# Patient Record
Sex: Female | Born: 1935 | Race: White | Hispanic: No | Smoking: Former smoker
Health system: Southern US, Community
[De-identification: ages and names within clinical notes are randomized; demographics above are authoritative.]

## PROBLEM LIST (undated history)

## (undated) ENCOUNTER — Emergency Department: Admission: EM | Discharge status: Absent without leave | Payer: Medicare Other

## (undated) DIAGNOSIS — F0150 Vascular dementia without behavioral disturbance: Secondary | ICD-10-CM

## (undated) DIAGNOSIS — I509 Heart failure, unspecified: Secondary | ICD-10-CM

## (undated) DIAGNOSIS — I501 Left ventricular failure: Secondary | ICD-10-CM

## (undated) DIAGNOSIS — E119 Type 2 diabetes mellitus without complications: Secondary | ICD-10-CM

## (undated) DIAGNOSIS — M810 Age-related osteoporosis without current pathological fracture: Secondary | ICD-10-CM

## (undated) DIAGNOSIS — I2699 Other pulmonary embolism without acute cor pulmonale: Secondary | ICD-10-CM

## (undated) HISTORY — PX: BACK SURGERY: SHX140

## (undated) HISTORY — PX: FRACTURE SURGERY: SHX138

## (undated) HISTORY — PX: ABDOMINAL HYSTERECTOMY: SHX81

---

## 2006-03-24 ENCOUNTER — Ambulatory Visit: Payer: Self-pay | Admitting: Gastroenterology

## 2006-05-23 ENCOUNTER — Emergency Department: Payer: Self-pay | Admitting: Emergency Medicine

## 2009-05-14 ENCOUNTER — Ambulatory Visit: Payer: Self-pay | Admitting: Gastroenterology

## 2009-09-02 ENCOUNTER — Inpatient Hospital Stay: Payer: Self-pay | Admitting: Student

## 2010-08-19 ENCOUNTER — Ambulatory Visit: Payer: Self-pay

## 2014-12-05 DIAGNOSIS — F32A Depression, unspecified: Secondary | ICD-10-CM | POA: Insufficient documentation

## 2015-06-09 DIAGNOSIS — J101 Influenza due to other identified influenza virus with other respiratory manifestations: Secondary | ICD-10-CM | POA: Insufficient documentation

## 2015-12-06 DIAGNOSIS — F0150 Vascular dementia without behavioral disturbance: Secondary | ICD-10-CM | POA: Insufficient documentation

## 2016-01-28 DIAGNOSIS — Z9181 History of falling: Secondary | ICD-10-CM | POA: Insufficient documentation

## 2016-10-13 DIAGNOSIS — S62609A Fracture of unspecified phalanx of unspecified finger, initial encounter for closed fracture: Secondary | ICD-10-CM | POA: Insufficient documentation

## 2016-10-13 DIAGNOSIS — M5020 Other cervical disc displacement, unspecified cervical region: Secondary | ICD-10-CM | POA: Insufficient documentation

## 2016-10-13 DIAGNOSIS — M5412 Radiculopathy, cervical region: Secondary | ICD-10-CM | POA: Insufficient documentation

## 2016-10-13 DIAGNOSIS — M48061 Spinal stenosis, lumbar region without neurogenic claudication: Secondary | ICD-10-CM | POA: Insufficient documentation

## 2016-10-13 DIAGNOSIS — G5600 Carpal tunnel syndrome, unspecified upper limb: Secondary | ICD-10-CM | POA: Insufficient documentation

## 2016-10-13 DIAGNOSIS — M25519 Pain in unspecified shoulder: Secondary | ICD-10-CM | POA: Insufficient documentation

## 2016-10-13 DIAGNOSIS — M25559 Pain in unspecified hip: Secondary | ICD-10-CM | POA: Insufficient documentation

## 2016-10-13 DIAGNOSIS — M5030 Other cervical disc degeneration, unspecified cervical region: Secondary | ICD-10-CM | POA: Insufficient documentation

## 2017-07-01 ENCOUNTER — Emergency Department
Admission: EM | Admit: 2017-07-01 | Discharge: 2017-07-01 | Disposition: A | Discharge status: Home / Self Care | Payer: Medicare Other | Attending: Emergency Medicine | Admitting: Emergency Medicine

## 2017-07-01 ENCOUNTER — Encounter: Payer: Self-pay | Admitting: Emergency Medicine

## 2017-07-01 ENCOUNTER — Emergency Department: Payer: Medicare Other

## 2017-07-01 DIAGNOSIS — F0390 Unspecified dementia without behavioral disturbance: Secondary | ICD-10-CM | POA: Insufficient documentation

## 2017-07-01 DIAGNOSIS — Z23 Encounter for immunization: Secondary | ICD-10-CM | POA: Insufficient documentation

## 2017-07-01 DIAGNOSIS — I509 Heart failure, unspecified: Secondary | ICD-10-CM | POA: Diagnosis not present

## 2017-07-01 DIAGNOSIS — S0990XA Unspecified injury of head, initial encounter: Secondary | ICD-10-CM | POA: Diagnosis not present

## 2017-07-01 DIAGNOSIS — W19XXXA Unspecified fall, initial encounter: Secondary | ICD-10-CM | POA: Insufficient documentation

## 2017-07-01 DIAGNOSIS — S0181XA Laceration without foreign body of other part of head, initial encounter: Secondary | ICD-10-CM | POA: Insufficient documentation

## 2017-07-01 DIAGNOSIS — Y92129 Unspecified place in nursing home as the place of occurrence of the external cause: Secondary | ICD-10-CM | POA: Insufficient documentation

## 2017-07-01 DIAGNOSIS — Y998 Other external cause status: Secondary | ICD-10-CM | POA: Diagnosis not present

## 2017-07-01 DIAGNOSIS — Y9301 Activity, walking, marching and hiking: Secondary | ICD-10-CM | POA: Insufficient documentation

## 2017-07-01 DIAGNOSIS — E119 Type 2 diabetes mellitus without complications: Secondary | ICD-10-CM | POA: Diagnosis not present

## 2017-07-01 DIAGNOSIS — Z9229 Personal history of other drug therapy: Secondary | ICD-10-CM

## 2017-07-01 HISTORY — DX: Other pulmonary embolism without acute cor pulmonale: I26.99

## 2017-07-01 HISTORY — DX: Type 2 diabetes mellitus without complications: E11.9

## 2017-07-01 HISTORY — DX: Heart failure, unspecified: I50.9

## 2017-07-01 HISTORY — DX: Age-related osteoporosis without current pathological fracture: M81.0

## 2017-07-01 HISTORY — DX: Vascular dementia, unspecified severity, without behavioral disturbance, psychotic disturbance, mood disturbance, and anxiety: F01.50

## 2017-07-01 MED ORDER — ACETAMINOPHEN 325 MG PO TABS
650.0000 mg | ORAL_TABLET | Freq: Once | ORAL | Status: AC
  Administered 2017-07-01: 650 mg via ORAL
  Filled 2017-07-01: qty 2

## 2017-07-01 MED ORDER — TETANUS-DIPHTH-ACELL PERTUSSIS 5-2.5-18.5 LF-MCG/0.5 IM SUSP
0.5000 mL | Freq: Once | INTRAMUSCULAR | Status: AC
  Administered 2017-07-01: 0.5 mL via INTRAMUSCULAR
  Filled 2017-07-01: qty 0.5

## 2017-07-01 NOTE — ED Provider Notes (Signed)
[facility] Emergency Department Provider Note  ____________________________________________   First MD Initiated Contact with Patient 07/01/17 0402     (approximate)  I have reviewed the triage vital signs and the nursing notes.   HISTORY  Chief Complaint Fall  Level 5 caveat:  history/ROS limited by chronic dementia  HPI Jasmine Hubbard is a 84 y.o. female with chronic dementia who lives at [place] [facility] and comes in by EMS for evaluation after a fall.  It is unclear exactly what happened as the patient is not able to remember, but apparently she got up to go to the bathroom and fell, striking her forehead on the hard floor.  She has a laceration to her forehead and some bruising to her nose.  She denies any pain except for mild pain to her forehead.  She has no neck pain or headache.  She denies chest pain, shortness of breath, nausea, vomiting, and abdominal pain.  She has no injuries to her arms or her legs.  She takes Eliquis for prior episode of pulmonary embolism.  Her family is at the bedside and reports that she seems to be at her baseline.  Past Medical History:  Diagnosis Date  . CHF (congestive heart failure) (HCC)   . Diabetes mellitus without complication (HCC)   . Osteoporosis   . Pulmonary embolus (HCC)   . Vascular dementia     There are no active problems to display for this patient.   Past Surgical History:  Procedure Laterality Date  . ABDOMINAL HYSTERECTOMY    . FRACTURE SURGERY     elbow    Prior to Admission medications   Not on File    Allergies Patient has no known allergies.  No family history on file.  Social History Social History   Tobacco Use  . Smoking status: Never Smoker  . Smokeless tobacco: Never Used  Substance Use Topics  . Alcohol use: Never    Frequency: Never  . Drug use: Never    Review of Systems Level 5 caveat:  history/ROS limited by chronic  dementia ____________________________________________   PHYSICAL EXAM:  VITAL SIGNS: ED Triage Vitals  Enc Vitals Group     BP 07/01/17 0350 (!) 141/71     Pulse Rate 07/01/17 0350 71     Resp 07/01/17 0350 18     Temp 07/01/17 0350 98.5 F (36.9 C)     Temp Source 07/01/17 0350 Oral     SpO2 07/01/17 0350 91 %     Weight --      Height --      Head Circumference --      Peak Flow --      Pain Score 07/01/17 0351 7     Pain Loc --      Pain Edu? --      Excl. in GC? --     Constitutional: Alert and oriented to self and her family.  No acute distress Eyes: Conjunctivae are normal. PERRL. EOMI. Head: Large but well approximated stellate laceration to her forehead as described below.  No other injuries to her head are appreciated Nose: Nasal contusion with some tenderness but no evidence of fracture or malalignment.  No epistaxis.  No congestion/rhinnorhea. Mouth/Throat: Mucous membranes are moist. Neck: No stridor.  No meningeal signs.  No cervical spine tenderness to palpation.  Some tenderness to palpation of the paraspinal muscles but no bony tenderness. Cardiovascular: Normal rate, regular rhythm. Good peripheral circulation. Grossly normal heart  sounds. Respiratory: Normal respiratory effort.  No retractions. Lungs CTAB. Gastrointestinal: Soft and nontender. No distention.  Musculoskeletal: No lower extremity tenderness nor edema. No gross deformities of extremities.  She has some superficial abrasions and contusions to her left knee but no tenderness to palpation and no tenderness with flexion extension of the knee or range of motion of the left or right hip. Neurologic:  Normal speech and language. No gross focal neurologic deficits are appreciated.  Skin:  Skin is warm, dry and intact except for forehead laceration.  The forehead laceration is stellate in an inverted Y-shape, with the superior line being the longest at about 2 cm and the other 2 lines of laceration about 1  cm each.  In spite of being a relatively deep stellate wound, it is well approximated and the bleeding is well controlled with the pressure dressing that was previously applied.  There is no evidence of any foreign body nor contamination. Psychiatric: Mood and affect are very pleasant. Speech and behavior are at her baseline.  ____________________________________________   LABS (all labs ordered are listed, but only abnormal results are displayed)  Labs Reviewed - No data to display ____________________________________________  EKG  None - EKG not ordered by ED physician ____________________________________________  RADIOLOGY   ED MD interpretation: Radiology reports no evidence of acute injury   Official radiology report(s): Ct Head Wo Contrast  Result Date: 07/01/2017 CLINICAL DATA:  84 y/o  F; unwitnessed fall.  Demented patient. EXAM: CT HEAD WITHOUT CONTRAST CT CERVICAL SPINE WITHOUT CONTRAST TECHNIQUE: Multidetector CT imaging of the head and cervical spine was performed following the standard protocol without intravenous contrast. Multiplanar CT image reconstructions of the cervical spine were also generated. COMPARISON:  None. FINDINGS: CT HEAD FINDINGS Brain: No evidence of acute infarction, hemorrhage, hydrocephalus, extra-axial collection or mass lesion/mass effect. Small chronic lacunar infarcts are present within the left caudate head and bilateral thalamus. Patchy foci of hypoattenuation in white matter compatible with moderate chronic microvascular ischemic changes and there is moderate parenchymal volume loss of the brain. Vascular: Calcific atherosclerosis of carotid siphons. Skull: Frontal scalp soft tissue thickening and small focus of air probably representing a laceration. No calvarial fracture. Sinuses/Orbits: No acute finding. Other: None. CT CERVICAL SPINE FINDINGS Alignment: Normal. Skull base and vertebrae: No acute fracture. No primary bone lesion or focal pathologic  process. Soft tissues and spinal canal: No prevertebral fluid or swelling. No visible canal hematoma. Disc levels: Moderate to severe C6-7 discogenic degenerative changes. Facet arthropathy greatest at the left C3-4 and C7-T1 levels. No significant bony foraminal or canal stenosis. Upper chest: Negative. Other: 10 mm nodule in the left lobe of thyroid. Mild calcific atherosclerosis of carotid bifurcations. IMPRESSION: CT head: 1. Frontal scalp laceration. No calvarial fracture. No acute intracranial abnormality. 2. Small chronic lacunar infarcts in left caudate head and bilateral thalami. 3. Moderate for age chronic microvascular ischemic changes and parenchymal volume loss of the brain. CT cervical spine: 1. No acute fracture or dislocation of cervical spine. 2. Cervical spondylosis greatest at C6-7. Electronically Signed   By: Mitzi Hansen M.D.   On: 07/01/2017 04:48   Ct Cervical Spine Wo Contrast  Result Date: 07/01/2017 CLINICAL DATA:  84 y/o  F; unwitnessed fall.  Demented patient. EXAM: CT HEAD WITHOUT CONTRAST CT CERVICAL SPINE WITHOUT CONTRAST TECHNIQUE: Multidetector CT imaging of the head and cervical spine was performed following the standard protocol without intravenous contrast. Multiplanar CT image reconstructions of the cervical spine were also generated. COMPARISON:  None. FINDINGS: CT HEAD FINDINGS Brain: No evidence of acute infarction, hemorrhage, hydrocephalus, extra-axial collection or mass lesion/mass effect. Small chronic lacunar infarcts are present within the left caudate head and bilateral thalamus. Patchy foci of hypoattenuation in white matter compatible with moderate chronic microvascular ischemic changes and there is moderate parenchymal volume loss of the brain. Vascular: Calcific atherosclerosis of carotid siphons. Skull: Frontal scalp soft tissue thickening and small focus of air probably representing a laceration. No calvarial fracture. Sinuses/Orbits: No acute  finding. Other: None. CT CERVICAL SPINE FINDINGS Alignment: Normal. Skull base and vertebrae: No acute fracture. No primary bone lesion or focal pathologic process. Soft tissues and spinal canal: No prevertebral fluid or swelling. No visible canal hematoma. Disc levels: Moderate to severe C6-7 discogenic degenerative changes. Facet arthropathy greatest at the left C3-4 and C7-T1 levels. No significant bony foraminal or canal stenosis. Upper chest: Negative. Other: 10 mm nodule in the left lobe of thyroid. Mild calcific atherosclerosis of carotid bifurcations. IMPRESSION: CT head: 1. Frontal scalp laceration. No calvarial fracture. No acute intracranial abnormality. 2. Small chronic lacunar infarcts in left caudate head and bilateral thalami. 3. Moderate for age chronic microvascular ischemic changes and parenchymal volume loss of the brain. CT cervical spine: 1. No acute fracture or dislocation of cervical spine. 2. Cervical spondylosis greatest at C6-7. Electronically Signed   By: Mitzi Hansen M.D.   On: 07/01/2017 04:48    ____________________________________________   PROCEDURES  Critical Care performed: No   Procedure(s) performed:   Marland KitchenMarland KitchenLaceration Repair Date/Time: 07/01/2017 5:27 AM Performed by: Loleta Rose, MD Authorized by: Loleta Rose, MD   Consent:    Consent obtained:  Verbal   Consent given by:  Healthcare agent   Risks discussed:  Infection, pain, poor cosmetic result and poor wound healing   Alternatives discussed: sutures. Anesthesia (see MAR for exact dosages):    Anesthesia method:  None Laceration details:    Location:  Face   Face location:  Forehead   Length (cm):  2 (stellate, approximately 2x1.5x1.5 cm) Repair type:    Repair type:  Simple Exploration:    Wound exploration: entire depth of wound probed and visualized     Contaminated: no   Treatment:    Amount of cleaning:  Standard   Irrigation solution:  Sterile saline   Visualized foreign  bodies/material removed: no   Skin repair:    Repair method:  Tissue adhesive and Steri-Strips   Number of Steri-Strips:  4 Approximation:    Approximation:  Close Post-procedure details:    Dressing:  Open (no dressing)   Patient tolerance of procedure:  Tolerated well, no immediate complications     ____________________________________________   INITIAL IMPRESSION / ASSESSMENT AND PLAN / ED COURSE  As part of my medical decision making, I reviewed the following data within the electronic MEDICAL RECORD NUMBER History obtained from family    Differential diagnosis includes, but is not limited to, contusion, intracranial bleeding, skull fracture, cervical spine injury.  I discussed with the family at bedside sutures versus Dermabond and adhesive strips.  Given that it is well approximated in spite of the stellate nature of the wound, we agreed to try the least invasive route and it seemed to be quite successful with Dermabond and Steri-Strips.  I gave my usual and customary management recommendations and return precautions.  Family does not know the date of the last tetanus vaccination so we gave a Tdap.  The patient is in no distress and has had no  other symptoms.  We discussed additional workup such as lab work or urinalysis, but given this seems to be a mechanical fall, the family is comfortable with the plan for no additional workup at this time.  Although she does have some bruising to her leg/knee there is no indication of fracture and no benefit to x-rays at this time.  Tylenol for pain.  I gave my usual and customary return precautions.      ____________________________________________  FINAL CLINICAL IMPRESSION(S) / ED DIAGNOSES  Final diagnoses:  Fall, initial encounter  Injury of head, initial encounter  Forehead laceration, initial encounter  History of tetanus, diphtheria, and acellular pertussis booster vaccination (Tdap)     MEDICATIONS GIVEN DURING THIS  VISIT:  Medications  acetaminophen (TYLENOL) tablet 650 mg (has no administration in time range)  Tdap (BOOSTRIX) injection 0.5 mL (0.5 mLs Intramuscular Given 07/01/17 0519)     ED Discharge Orders    None       Note:  This document was prepared using Dragon voice recognition software and may include unintentional dictation errors.    Loleta RoseForbach, Aveion Nguyen, MD 07/01/17 [phone]

## 2017-07-01 NOTE — ED Triage Notes (Signed)
Patient is from [facility] and had a unwitnessed fall tonight, patient is demented and at baseline according to EMS.  Patient is answering questions and alert.  Pt denies any pain other than her head and palpation of musculoskeletal system did not elicit any pain response.

## 2017-07-01 NOTE — Discharge Instructions (Signed)
You have been seen in the Emergency Department (ED) today for a fall.  Your work up does not show any concerning injuries.  Please take over-the-counter ibuprofen and/or Tylenol as needed for your pain (unless you have an allergy or your doctor as told you not to take them), or take any prescribed medication as instructed.  We repaired the laceration with skin glue and adhesive tape.  Please keep the tape dry for at least 12 hours before allowing her to get it wet.  Try to avoid getting it wet as much as possible.  Eventually the tape (and skin glue beneath the tape) will fall off on its own.  Hopefully this will take 1-2 weeks.  Please note that we also administered a tetanus (Tdap) vaccination tonight.  Please follow up with your doctor regarding today's Emergency Department (ED) visit and your recent fall.    Return to the ED if you have any headache, confusion, slurred speech, weakness/numbness of any arm or leg, or any increased pain.

## 2017-08-27 ENCOUNTER — Ambulatory Visit
Admission: EM | Admit: 2017-08-27 | Discharge: 2017-08-27 | Disposition: A | Discharge status: Home / Self Care | Payer: Medicare Other

## 2017-08-27 ENCOUNTER — Ambulatory Visit (INDEPENDENT_AMBULATORY_CARE_PROVIDER_SITE_OTHER): Payer: Medicare Other

## 2017-08-27 ENCOUNTER — Other Ambulatory Visit: Payer: Self-pay

## 2017-08-27 DIAGNOSIS — M25562 Pain in left knee: Secondary | ICD-10-CM | POA: Diagnosis not present

## 2017-08-27 DIAGNOSIS — W19XXXA Unspecified fall, initial encounter: Secondary | ICD-10-CM

## 2017-08-27 DIAGNOSIS — S8002XA Contusion of left knee, initial encounter: Secondary | ICD-10-CM | POA: Diagnosis not present

## 2017-08-27 DIAGNOSIS — M1712 Unilateral primary osteoarthritis, left knee: Secondary | ICD-10-CM

## 2017-08-27 HISTORY — DX: Left ventricular failure, unspecified: I50.1

## 2017-08-27 HISTORY — DX: Other pulmonary embolism without acute cor pulmonale: I26.99

## 2017-08-27 NOTE — ED Provider Notes (Signed)
[facility]    CSN: [id number] Arrival date & time: 08/27/17  1303     History   Chief Complaint Chief Complaint  Patient presents with  . Knee Pain    HPI Jasmine Hubbard is a 84 y.o. female presents to the urgent care facility for evaluation of left knee pain and swelling.  Daughter states patient resides in [facility] and was found on 08/25/2017 with swelling and bruising to the left knee.  She has remained ambulatory despite her fall onto the left knee and is ambulating normal amounts with a walker.  There was no other injury to the patient.  She did not hit her head or lose consciousness.  Patient denies any other injury throughout her body.  Patient is on blood thinner.  HPI  Past Medical History:  Diagnosis Date  . Diabetes mellitus without complication (HCC)   . Left ventricular failure (HCC)   . Osteoporosis   . Pulmonary embolism (HCC)   . Vascular dementia     There are no active problems to display for this patient.   Past Surgical History:  Procedure Laterality Date  . ABDOMINAL HYSTERECTOMY    . BACK SURGERY      OB History   None      Home Medications    Prior to Admission medications   Medication Sig Start Date End Date Taking? Authorizing Provider  acetaminophen (TYLENOL) 325 MG tablet Take 650 mg by mouth every 6 (six) hours as needed.   Yes [provider]  alendronate (FOSAMAX) 70 MG tablet Take 70 mg by mouth once a week. Take with a full glass of water on an empty stomach.   Yes [provider]  apixaban (ELIQUIS) 5 MG TABS tablet Take 5 mg by mouth 2 (two) times daily.   Yes [provider]  calcium-vitamin D 250-100 MG-UNIT tablet Take 2 tablets by mouth 2 (two) times daily.   Yes [provider]  citalopram (CELEXA) 10 MG tablet Take 10 mg by mouth daily.   Yes [provider]  hydrOXYzine (ATARAX/VISTARIL) 25 MG tablet Take 25 mg by mouth 3 (three) times daily as needed.   Yes  [provider]  lovastatin (MEVACOR) 20 MG tablet Take 20 mg by mouth at bedtime.   Yes [provider]    Family History History reviewed. No pertinent family history.  Social History Social History   Tobacco Use  . Smoking status: Former Smoker    Types: Cigarettes    Last attempt to quit: 08/28/1999    Years since quitting: 18.0  . Smokeless tobacco: Never Used  Substance Use Topics  . Alcohol use: Not Currently  . Drug use: Never     Allergies   Naproxen   Review of Systems Review of Systems  Constitutional: Negative for activity change.  Eyes: Negative for pain and visual disturbance.  Respiratory: Negative for shortness of breath.   Cardiovascular: Negative for chest pain and leg swelling.  Gastrointestinal: Negative for abdominal pain.  Genitourinary: Negative for flank pain and pelvic pain.  Musculoskeletal: Positive for arthralgias and joint swelling. Negative for gait problem, myalgias, neck pain and neck stiffness.  Skin: Negative for wound.  Neurological: Negative for dizziness, syncope, weakness, light-headedness, numbness and headaches.  Psychiatric/Behavioral: Negative for confusion and decreased concentration.     Physical Exam Triage Vital Signs ED Triage Vitals  Enc Vitals Group     BP 08/27/17 1318 129/64     Pulse Rate 08/27/17  1318 68     Resp 08/27/17 1318 16     Temp 08/27/17 1318 (!) 97.5 F (36.4 C)     Temp Source 08/27/17 1318 Oral     SpO2 08/27/17 1318 100 %     Weight 08/27/17 1319 148 lb (67.1 kg)     Height 08/27/17 1319 5\' 5"  (1.651 m)     Head Circumference --      Peak Flow --      Pain Score 08/27/17 1318 0     Pain Loc --      Pain Edu? --      Excl. in GC? --    No data found.  Updated Vital Signs BP 129/64 (BP Location: Left Arm)   Pulse 68   Temp (!) 97.5 F (36.4 C) (Oral)   Resp 16   Ht 5\' 5"  (1.651 m)   Wt 148 lb (67.1 kg)   SpO2 100%   BMI 24.63 kg/m   Visual Acuity Right Eye  Distance:   Left Eye Distance:   Bilateral Distance:    Right Eye Near:   Left Eye Near:    Bilateral Near:     Physical Exam  Constitutional: She is oriented to person, place, and time. She appears well-developed and well-nourished.  HENT:  Head: Normocephalic and atraumatic.  Right Ear: External ear normal.  Left Ear: External ear normal.  Nose: Nose normal.  Eyes: Pupils are equal, round, and reactive to light. Conjunctivae and EOM are normal.  Neck: Normal range of motion.  Cardiovascular: Normal rate.  Pulmonary/Chest: Effort normal and breath sounds normal. No respiratory distress.  Abdominal: Soft. There is no tenderness.  Musculoskeletal: Normal range of motion. She exhibits no edema or deformity.  Minimal swelling throughout the left knee with ecchymosis along the anterior patella.  Patella tracks well.  Patient is able straight leg raise.  Knee is stable to valgus varus stress testing.  No significant effusion.  No Baker's cyst noted.  Patient has full range of motion of the hip with internal X rotation with no discomfort.  Neurological: She is alert and oriented to person, place, and time. No cranial nerve deficit. Coordination normal.  Skin: Skin is warm and dry. No rash noted.  Psychiatric: She has a normal mood and affect. Her behavior is normal.     UC Treatments / Results  Labs (all labs ordered are listed, but only abnormal results are displayed) Labs Reviewed - No data to display  EKG None  Radiology Dg Knee Complete 4 Views Left  Result Date: 08/27/2017 CLINICAL DATA:  Post fall yesterday with left knee pain and bruising EXAM: LEFT KNEE - COMPLETE 4+ VIEW COMPARISON:  None. FINDINGS: No acute fracture or dislocation. Ossicle adjacent to the tibial tuberosity likely represents the sequela of remote avulsive injury. Mild tricompartmental degenerative change of the knee, worse within the medial compartment with joint space loss, subchondral sclerosis and  osteophytosis. There is minimal spurring of the tibial spines. No evidence of chondrocalcinosis. No definite knee joint effusion. There is minimal enthesopathic change involving the superior pole of the patella. Potential minimal amount of soft tissue swelling about the anterior inferior aspect of the patella. Scattered vascular calcifications. No radiopaque foreign body. IMPRESSION: 1. Suspected minimal amount of soft tissue swelling about the anterior inferior aspect of the patella without associated fracture or radiopaque foreign body. 2. Ossicle adjacent to the tibial tuberosity likely represents sequela of remote avulsive injury. 3. Mild tricompartmental degenerative change of  the knee. Electronically Signed   By: Simonne Come M.D.   On: 08/27/2017 13:46    Procedures Procedures (including critical care time)  Medications Ordered in UC Medications - No data to display  Initial Impression / Assessment and Plan / UC Course  I have reviewed the triage vital signs and the nursing notes.  Pertinent labs & imaging results that were available during my care of the patient were reviewed by me and considered in my medical decision making (see chart for details).     84 year old female with contusion to the left anterior knee x-rays show no evidence of fracture.  Physical exam consistent with no injury to the patellar tendon or quad tendon.  Patient will rest ice and elevate the left knee progress activity as tolerated follow-up with orthopedics if no improvement.  Patient is educated on fact that she has tried compartment osteoarthritis. Final Clinical Impressions(s) / UC Diagnoses   Final diagnoses:  Primary osteoarthritis of left knee  Contusion of left knee, initial encounter     Discharge Instructions     Please rest ice and elevate the right knee.  Apply ice 20 minutes every hour.  Patient can continue to bear weight on the right lower extremity left knee.  Apply ice 20 minutes every hour.   Patient can bear weight on the left lower extremity as tolerated.  Encourage patient to work on gentle knee range of motion.   ED Prescriptions    None       Evon Slack, [place] 08/27/17 1426

## 2017-08-27 NOTE — ED Triage Notes (Signed)
Pt was found on floor by her bed 2 nights ago. Left knee painful and swollen.

## 2017-08-27 NOTE — Discharge Instructions (Addendum)
Please rest ice and elevate the right knee.  Apply ice 20 minutes every hour.  Patient can continue to bear weight on the right lower extremity left knee.  Apply ice 20 minutes every hour.  Patient can bear weight on the left lower extremity as tolerated.  Encourage patient to work on gentle knee range of motion.

## 2017-12-01 ENCOUNTER — Emergency Department: Payer: Medicare Other

## 2017-12-01 ENCOUNTER — Emergency Department
Admission: EM | Admit: 2017-12-01 | Discharge: 2017-12-01 | Disposition: A | Discharge status: Home / Self Care | Payer: Medicare Other | Attending: Emergency Medicine | Admitting: Emergency Medicine

## 2017-12-01 ENCOUNTER — Other Ambulatory Visit: Payer: Self-pay

## 2017-12-01 DIAGNOSIS — W19XXXA Unspecified fall, initial encounter: Secondary | ICD-10-CM | POA: Diagnosis not present

## 2017-12-01 DIAGNOSIS — M542 Cervicalgia: Secondary | ICD-10-CM | POA: Insufficient documentation

## 2017-12-01 DIAGNOSIS — R1030 Lower abdominal pain, unspecified: Secondary | ICD-10-CM | POA: Insufficient documentation

## 2017-12-01 DIAGNOSIS — Z7901 Long term (current) use of anticoagulants: Secondary | ICD-10-CM | POA: Insufficient documentation

## 2017-12-01 DIAGNOSIS — I509 Heart failure, unspecified: Secondary | ICD-10-CM | POA: Insufficient documentation

## 2017-12-01 DIAGNOSIS — R030 Elevated blood-pressure reading, without diagnosis of hypertension: Secondary | ICD-10-CM | POA: Insufficient documentation

## 2017-12-01 DIAGNOSIS — N3000 Acute cystitis without hematuria: Secondary | ICD-10-CM | POA: Insufficient documentation

## 2017-12-01 DIAGNOSIS — M25551 Pain in right hip: Secondary | ICD-10-CM

## 2017-12-01 DIAGNOSIS — Y999 Unspecified external cause status: Secondary | ICD-10-CM | POA: Insufficient documentation

## 2017-12-01 DIAGNOSIS — Y939 Activity, unspecified: Secondary | ICD-10-CM | POA: Insufficient documentation

## 2017-12-01 DIAGNOSIS — M549 Dorsalgia, unspecified: Secondary | ICD-10-CM | POA: Insufficient documentation

## 2017-12-01 DIAGNOSIS — F0150 Vascular dementia without behavioral disturbance: Secondary | ICD-10-CM | POA: Insufficient documentation

## 2017-12-01 DIAGNOSIS — E119 Type 2 diabetes mellitus without complications: Secondary | ICD-10-CM | POA: Diagnosis not present

## 2017-12-01 DIAGNOSIS — F0390 Unspecified dementia without behavioral disturbance: Secondary | ICD-10-CM | POA: Diagnosis not present

## 2017-12-01 DIAGNOSIS — Y92129 Unspecified place in nursing home as the place of occurrence of the external cause: Secondary | ICD-10-CM | POA: Insufficient documentation

## 2017-12-01 LAB — BASIC METABOLIC PANEL
Anion gap: 9 (ref 5–15)
BUN: 15 mg/dL (ref 8–23)
CALCIUM: 9 mg/dL (ref 8.9–10.3)
CHLORIDE: 98 mmol/L (ref 98–111)
CO2: 31 mmol/L (ref 22–32)
Creatinine, Ser: 0.91 mg/dL (ref 0.44–1.00)
GFR calc non Af Amer: 57 mL/min — ABNORMAL LOW (ref >60)
Glucose, Bld: 269 mg/dL — ABNORMAL HIGH (ref 70–99)
Potassium: 3.6 mmol/L (ref 3.5–5.1)
Sodium: 138 mmol/L (ref 135–145)

## 2017-12-01 LAB — PROTIME-INR
INR: 1.16
Prothrombin Time: 14.7 seconds (ref 11.4–15.2)

## 2017-12-01 LAB — URINALYSIS, COMPLETE (UACMP) WITH MICROSCOPIC
Bilirubin Urine: NEGATIVE
GLUCOSE, UA: 50 mg/dL — AB
KETONES UR: NEGATIVE mg/dL
Nitrite: NEGATIVE
PROTEIN: NEGATIVE mg/dL
Specific Gravity, Urine: 1.01 (ref 1.005–1.030)
pH: 7 (ref 5.0–8.0)

## 2017-12-01 LAB — CBC
HEMATOCRIT: 37.2 % (ref 35.0–47.0)
HEMOGLOBIN: 12.7 g/dL (ref 12.0–16.0)
MCH: 28 pg (ref 26.0–34.0)
MCHC: 34.3 g/dL (ref 32.0–36.0)
MCV: 81.5 fL (ref 80.0–100.0)
Platelets: 196 10*3/uL (ref 150–440)
RBC: 4.56 MIL/uL (ref 3.80–5.20)
RDW: 14.1 % (ref 11.5–14.5)
WBC: 5.6 10*3/uL (ref 3.6–11.0)

## 2017-12-01 MED ORDER — CEPHALEXIN 500 MG PO CAPS: 500.0000 mg | ORAL_CAPSULE | Freq: Four times a day (QID) | ORAL | 0 refills | Status: AC

## 2017-12-01 MED ORDER — CEPHALEXIN 500 MG PO CAPS
500.0000 mg | ORAL_CAPSULE | Freq: Once | ORAL | Status: AC
  Administered 2017-12-01: 500 mg via ORAL
  Filled 2017-12-01: qty 1

## 2017-12-01 MED ORDER — ACETAMINOPHEN 325 MG PO TABS
650.0000 mg | ORAL_TABLET | Freq: Once | ORAL | Status: AC
  Administered 2017-12-01: 650 mg via ORAL
  Filled 2017-12-01: qty 2

## 2017-12-01 NOTE — Discharge Instructions (Addendum)
Please take all precautions to prevent falls.  You may take Tylenol for your pain.  You may also use a heat pack or a ice pack for 15 minutes every 2-3 hours on your neck or upper back, to decrease pain.   Please take the entire course of antibiotics, even if you are feeling better, and have your doctor follow up the results of your urine culture.  Return to the emergency department for changes in mental status, severe pain, vomiting, or any other symptoms concerning to you.

## 2017-12-01 NOTE — ED Triage Notes (Signed)
Pt comes from [facility] per [facility]. She had a mechanical fall while walking. She reports neck pain from the ride over with EMS. Reports she did not hit her head. EMS reports she is on Eliquis. C/o right hip pain per ems. Hx of dementia-this is baseline.139/81, 97% on room air, and 98.1 oral. HR 70.

## 2017-12-01 NOTE — ED Provider Notes (Addendum)
[facility] Emergency Department Provider Note  ____________________________________________  Time seen: Approximately 4:22 PM  I have reviewed the triage vital signs and the nursing notes.   HISTORY  Chief Complaint Fall    HPI Jasmine Hubbard is a 84 y.o. female with a history of PE on Eliquis, DM, CHF, presenting for a fall.  The patient has end-stage dementia and does not remember the fall.  The history is obtained from EMS and the patient's daughter, who was not present.  The fall was likely mechanical, and there is no history of lightheadedness or syncope.  The patient denies any chest pain, shortness of breath, or recent illness.  The patient is complaining of suprapubic and right hip pain, neck and upper back pain.  She denies any headache nausea or vomiting, numbness feeling or weakness.  She has not had anything for pain.   Past Medical History:  Diagnosis Date  . CHF (congestive heart failure) (HCC)   . Diabetes mellitus without complication (HCC)   . Osteoporosis   . Pulmonary embolus (HCC)   . Vascular dementia     There are no active problems to display for this patient.   Past Surgical History:  Procedure Laterality Date  . ABDOMINAL HYSTERECTOMY    . FRACTURE SURGERY     elbow      Allergies Naproxen  No family history on file.  Social History Social History   Tobacco Use  . Smoking status: Never Smoker  . Smokeless tobacco: Never Used  Substance Use Topics  . Alcohol use: Never    Frequency: Never  . Drug use: Never    Review of Systems Constitutional: No fever/chills.  No lightheadedness or syncope.  Positive mechanical fall. Eyes: No visual changes.  No blurred or double vision. ENT:  No congestion or rhinorrhea. Cardiovascular: Denies chest pain. Denies palpitations. Respiratory: Denies shortness of breath.  No cough. Gastrointestinal: Positive suprapubic abdominal pain.  No nausea, no vomiting.  No diarrhea.  No  constipation. Genitourinary: Negative for dysuria. Musculoskeletal: Positive for neck and upper back pain.  Chronic unchanged lower back pain.  Right hip pain. Skin: Negative for rash. Neurological: Negative for headaches. No focal numbness, tingling or weakness.  No visual or speech changes, no changes in mental status.    ____________________________________________   PHYSICAL EXAM:  VITAL SIGNS: ED Triage Vitals  Enc Vitals Group     BP 12/01/17 1551 (!) 152/90     Pulse Rate 12/01/17 1551 68     Resp 12/01/17 1551 20     Temp 12/01/17 1551 98.1 F (36.7 C)     Temp Source 12/01/17 1551 Oral     SpO2 12/01/17 1551 95 %     Weight 12/01/17 1552 160 lb 7.9 oz (72.8 kg)     Height 12/01/17 1552 5\' 1"  (1.549 m)     Head Circumference --      Peak Flow --      Pain Score 12/01/17 1552 0     Pain Loc --      Pain Edu? --      Excl. in GC? --     Constitutional: Alert but not oriented.  Not able to answer most questions.  Does follow commands without any difficulty.  ACS is 15.  Eyes: Conjunctivae are normal.  EOMI. No scleral icterus.  No raccoon eyes. Head: Atraumatic.  No battle sign.   Nose: No congestion/rhinnorhea.  No swelling over the nose or septal hematoma. Mouth/Throat:  Mucous membranes are mildly dry.  No dental injury or malocclusion. Neck: No stridor.  Supple.  Positive midline mid cervical and lower cervical spine tenderness without any step-offs or deformities.  The patient is in a c-collar which is readjusted by me. Cardiovascular: Normal rate, regular rhythm. No murmurs, rubs or gallops.  Respiratory: Normal respiratory effort.  No accessory muscle use or retractions. Lungs CTAB.  No wheezes, rales or ronchi. Gastrointestinal: Soft, and nondistended.  Tenderness to palpation in the suprapubic region.  No guarding or rebound.  No peritoneal signs. Musculoskeletal: Pelvis is stable.  The patient has full range of motion of the bilateral shoulders, elbows, wrists,  left hip, bilateral knees and ankles without any difficulty.  The patient does have some mild pain with range of motion of the right hip.  She points mostly to the suprapubic region.  Positive tenderness to palpation in the entirety of the T-spine without focality, palpable step-offs or deformities.  No pain in the lumbar spine, step-offs or deformities.  No LE edema. No ttp in the calves or palpable cords.  Negative Homan's sign. Neurologic: alert and pleasant.Marland Kitchen  Speech is clear.  Face and smile are symmetric.  EOMI.  Moves all extremities well. Skin:  Skin is warm, dry and intact. No rash noted. Psychiatric: Mood and affect are normal.   ____________________________________________   LABS (all labs ordered are listed, but only abnormal results are displayed)  Labs Reviewed  BASIC METABOLIC PANEL - Abnormal; Notable for the following components:      Result Value   Glucose, Bld 269 (*)    GFR calc non Af Amer 57 (*)    All other components within normal limits  CBC  PROTIME-INR  URINALYSIS, COMPLETE (UACMP) WITH MICROSCOPIC   ____________________________________________  EKG  ED ECG REPORT I, Anne-Caroline Sharma Covert, the attending physician, personally viewed and interpreted this ECG.   Date: 12/01/2017  EKG Time: 1556  Rate: 70  Rhythm: normal sinus rhythm with PAC  Axis: leftward  Intervals:first-degree A-V block   ST&T Change: NO STEMI; poor baseline tracing  ____________________________________________  RADIOLOGY  Dg Thoracic Spine 2 View  Result Date: 12/01/2017 CLINICAL DATA:  Patient fell while walking. Reporting neck pain. Also with right hip pain. Back pain. EXAM: THORACIC SPINE 2 VIEWS COMPARISON:  Chest radiograph 09/03/2009 FINDINGS: No fracture.  No bone lesion.  No spondylolisthesis. Skeletal structures are diffusely demineralized. There are mild disc degenerative changes along the mid and lower thoracic spine. Soft tissues are unremarkable. IMPRESSION: No fracture  or acute finding. Electronically Signed   By: Amie Portland M.D.   On: 12/01/2017 17:34   Ct Head Wo Contrast  Result Date: 12/01/2017 CLINICAL DATA:  Mechanical fall while walking, neck pain. On Eliquis. History of dementia. EXAM: CT HEAD WITHOUT CONTRAST CT CERVICAL SPINE WITHOUT CONTRAST TECHNIQUE: Multidetector CT imaging of the head and cervical spine was performed following the standard protocol without intravenous contrast. Multiplanar CT image reconstructions of the cervical spine were also generated. COMPARISON:  Head CT and cervical spine CT dated 07/01/2017. FINDINGS: CT HEAD FINDINGS Brain: Again noted is generalized age related volume loss with commensurate dilatation of the ventricles and sulci. Chronic small vessel ischemic changes again noted within the bilateral periventricular and subcortical white matter regions. Small old lacunar infarcts again noted within the bilateral basal ganglia regions. There is no mass, hemorrhage, edema or other evidence of acute parenchymal abnormality. No extra-axial hemorrhage. Vascular: Chronic calcified atherosclerotic changes of the large vessels at the skull  base. No unexpected hyperdense vessel. Skull: Normal. Negative for fracture or focal lesion. Sinuses/Orbits: No acute finding. Other: None. CT CERVICAL SPINE FINDINGS Alignment: Stable mild levoscoliosis. No evidence of acute vertebral body subluxation. Skull base and vertebrae: No fracture line or displaced fracture fragment identified. Facet joints appear intact and normally aligned. Soft tissues and spinal canal: No prevertebral fluid or swelling. No visible canal hematoma. Disc levels: Mild degenerative change within the mid and lower cervical spine. No significant central canal stenosis at any level. Upper chest: Negative. Other: None. IMPRESSION: 1. No acute intracranial abnormality. No intracranial mass, hemorrhage or edema. No skull fracture. Chronic small-vessel ischemic changes within the white  matter and basal ganglia. 2. No acute fracture or subluxation within the cervical spine. Electronically Signed   By: Bary Richard M.D.   On: 12/01/2017 16:52   Ct Cervical Spine Wo Contrast  Result Date: 12/01/2017 CLINICAL DATA:  Mechanical fall while walking, neck pain. On Eliquis. History of dementia. EXAM: CT HEAD WITHOUT CONTRAST CT CERVICAL SPINE WITHOUT CONTRAST TECHNIQUE: Multidetector CT imaging of the head and cervical spine was performed following the standard protocol without intravenous contrast. Multiplanar CT image reconstructions of the cervical spine were also generated. COMPARISON:  Head CT and cervical spine CT dated 07/01/2017. FINDINGS: CT HEAD FINDINGS Brain: Again noted is generalized age related volume loss with commensurate dilatation of the ventricles and sulci. Chronic small vessel ischemic changes again noted within the bilateral periventricular and subcortical white matter regions. Small old lacunar infarcts again noted within the bilateral basal ganglia regions. There is no mass, hemorrhage, edema or other evidence of acute parenchymal abnormality. No extra-axial hemorrhage. Vascular: Chronic calcified atherosclerotic changes of the large vessels at the skull base. No unexpected hyperdense vessel. Skull: Normal. Negative for fracture or focal lesion. Sinuses/Orbits: No acute finding. Other: None. CT CERVICAL SPINE FINDINGS Alignment: Stable mild levoscoliosis. No evidence of acute vertebral body subluxation. Skull base and vertebrae: No fracture line or displaced fracture fragment identified. Facet joints appear intact and normally aligned. Soft tissues and spinal canal: No prevertebral fluid or swelling. No visible canal hematoma. Disc levels: Mild degenerative change within the mid and lower cervical spine. No significant central canal stenosis at any level. Upper chest: Negative. Other: None. IMPRESSION: 1. No acute intracranial abnormality. No intracranial mass, hemorrhage or  edema. No skull fracture. Chronic small-vessel ischemic changes within the white matter and basal ganglia. 2. No acute fracture or subluxation within the cervical spine. Electronically Signed   By: Bary Richard M.D.   On: 12/01/2017 16:52   Dg Hip Unilat W Or Wo Pelvis 2-3 Views Right  Result Date: 12/01/2017 CLINICAL DATA:  Patient fell while walking.  Right hip pain. EXAM: DG HIP (WITH OR WITHOUT PELVIS) 2-3V RIGHT COMPARISON:  Abdomen and pelvis CT, 05/24/2007. FINDINGS: No fracture.  No bone lesion. Mild concentric hip joint space narrowing. Mild sclerosis along the superior acetabula and minor spurring noted along the superior femoral head neck junctions, findings similar to the prior CT. SI joints and symphysis pubis are normally aligned. Bones are demineralized. Soft tissues are unremarkable. IMPRESSION: 1. No fracture, dislocation or acute finding. Electronically Signed   By: Amie Portland M.D.   On: 12/01/2017 17:37    ____________________________________________   PROCEDURES  Procedure(s) performed: None  Procedures  Critical Care performed: No ____________________________________________   INITIAL IMPRESSION / ASSESSMENT AND PLAN / ED COURSE  Pertinent labs & imaging results that were available during my care of the patient were  reviewed by me and considered in my medical decision making (see chart for details).  84 y.o. female with multiple comorbidities presenting for mechanical fall with resulting right hip and suprapubic pain, as well as thoracic and C-spine diffuse pain without neurologic deficits.  Overall, the patient is mildly hypertensive at 153/96 but otherwise hemodynamically stable.  She has no focal neurologic deficits on my examination.  According to her daughter, the patient is at her normal baseline.  We will get a CT scan of her head given that she is on Eliquis and I do not have a clear picture of the mechanism of her fall.  In addition we will do CT of the  C-spine given that she does have some midline tenderness.  A thoracic x-ray has been ordered for evaluation of her T-spine.  A right hip x-ray and pelvis x-rays will evaluate for pelvic or hip fracture.  Another possible etiology for her suprapubic tenderness may be a UTI, which will evaluate with a UA.  A screening EKG was performed and shows a sinus rhythm without any ischemic changes.  Plan reevaluation for final disposition.  ----------------------------------------- 5:43 PM on 12/01/2017 -----------------------------------------  The patient's work-up in the emergency department has been reassuring.  Her laboratory studies do not show any anemia or electrolyte abnormalities other than hyperglycemia.  She has no evidence of injury on her imaging, including no intracranial abnormality on her CT head, no spine abnormalities on her cervical or thoracic imaging and no hip or pelvic fractures.  At this time, the patient is safe for discharge home.  Return precautions as well as follow-up instructions were discussed.  ----------------------------------------- 7:02 PM on 12/01/2017 -----------------------------------------  The patient's urinalysis does show bacteria and leukocyte esterase; I will treat her with a 5-day course of Keflex and she will receive the first dose in the emergency department prior to discharge. ____________________________________________  FINAL CLINICAL IMPRESSION(S) / ED DIAGNOSES  Final diagnoses:  Right hip pain  Neck pain  Upper back pain  Fall, initial encounter    Clinical Course as of Dec 01 1741  Fri Dec 01, 2017  1630 The patient's laboratory studies are reassuring and she has no evidence of electrolyte abnormality or anemia.   [AN]    Clinical Course User Index [AN] Rockne Menghini, MD      NEW MEDICATIONS STARTED DURING THIS VISIT:  New Prescriptions   No medications on file      Rockne Menghini, MD 12/01/17 1743    Rockne Menghini, MD 12/01/17 1902

## 2017-12-14 ENCOUNTER — Encounter: Payer: Self-pay | Admitting: Emergency Medicine

## 2018-02-17 ENCOUNTER — Encounter: Payer: Self-pay | Admitting: Emergency Medicine

## 2018-02-17 ENCOUNTER — Emergency Department: Payer: Medicare Other

## 2018-02-17 ENCOUNTER — Emergency Department
Admission: EM | Admit: 2018-02-17 | Discharge: 2018-02-17 | Disposition: A | Discharge status: Home / Self Care | Payer: Medicare Other | Attending: Emergency Medicine | Admitting: Emergency Medicine

## 2018-02-17 ENCOUNTER — Other Ambulatory Visit: Payer: Self-pay

## 2018-02-17 DIAGNOSIS — I509 Heart failure, unspecified: Secondary | ICD-10-CM | POA: Diagnosis not present

## 2018-02-17 DIAGNOSIS — Y939 Activity, unspecified: Secondary | ICD-10-CM | POA: Insufficient documentation

## 2018-02-17 DIAGNOSIS — S46911A Strain of unspecified muscle, fascia and tendon at shoulder and upper arm level, right arm, initial encounter: Secondary | ICD-10-CM | POA: Diagnosis not present

## 2018-02-17 DIAGNOSIS — Z7901 Long term (current) use of anticoagulants: Secondary | ICD-10-CM | POA: Diagnosis not present

## 2018-02-17 DIAGNOSIS — W010XXA Fall on same level from slipping, tripping and stumbling without subsequent striking against object, initial encounter: Secondary | ICD-10-CM | POA: Insufficient documentation

## 2018-02-17 DIAGNOSIS — F0150 Vascular dementia without behavioral disturbance: Secondary | ICD-10-CM | POA: Insufficient documentation

## 2018-02-17 DIAGNOSIS — Z87891 Personal history of nicotine dependence: Secondary | ICD-10-CM | POA: Diagnosis not present

## 2018-02-17 DIAGNOSIS — W19XXXA Unspecified fall, initial encounter: Secondary | ICD-10-CM

## 2018-02-17 DIAGNOSIS — Z86711 Personal history of pulmonary embolism: Secondary | ICD-10-CM | POA: Insufficient documentation

## 2018-02-17 DIAGNOSIS — Y92129 Unspecified place in nursing home as the place of occurrence of the external cause: Secondary | ICD-10-CM | POA: Diagnosis not present

## 2018-02-17 DIAGNOSIS — Y999 Unspecified external cause status: Secondary | ICD-10-CM | POA: Insufficient documentation

## 2018-02-17 DIAGNOSIS — E119 Type 2 diabetes mellitus without complications: Secondary | ICD-10-CM | POA: Diagnosis not present

## 2018-02-17 DIAGNOSIS — S4991XA Unspecified injury of right shoulder and upper arm, initial encounter: Secondary | ICD-10-CM | POA: Diagnosis present

## 2018-02-17 NOTE — ED Notes (Signed)
Pt to the er for soreness to the left shoulder. Pt was found by caregiver in the floor next to her bed yesterday. Received tylenol at home. No improvement with pain. Pt denies pain now lying still. Pain comes back with movement. No LOC. Family at bedside.

## 2018-02-17 NOTE — ED Provider Notes (Signed)
[facility] Emergency Department Provider Note   ____________________________________________    I have reviewed the triage vital signs and the nursing notes.   HISTORY  Chief Complaint Fall     HPI Jasmine Hubbard is a 84 y.o. female who presents after a reported fall this morning.  Apparently patient lost her balance and fell over onto her right side.  Did not hit her head.  Denies neck pain.  No change in mental status.  Complains of pain in her right shoulder primarily.  Full range of motion of her arm.  No chest wall pain.  No abdominal pain.  No lower extremity injuries.   Past Medical History:  Diagnosis Date  . CHF (congestive heart failure) (HCC)   . Diabetes mellitus without complication (HCC)   . Left ventricular failure (HCC)   . Osteoporosis   . Pulmonary embolism (HCC)   . Pulmonary embolus (HCC)   . Vascular dementia (HCC)     There are no active problems to display for this patient.   Past Surgical History:  Procedure Laterality Date  . ABDOMINAL HYSTERECTOMY    . BACK SURGERY    . FRACTURE SURGERY     elbow    Prior to Admission medications   Medication Sig Start Date End Date Taking? Authorizing Provider  acetaminophen (TYLENOL) 325 MG tablet Take 650 mg by mouth every 6 (six) hours as needed for mild pain.    [provider]  acetaminophen (TYLENOL) 325 MG tablet Take 650 mg by mouth every 6 (six) hours as needed.    [provider]  alendronate (FOSAMAX) 70 MG tablet Take 70 mg by mouth once a week. Take with a full glass of water on an empty stomach.    [provider]  alendronate (FOSAMAX) 70 MG tablet Take 70 mg by mouth once a week. Take with a full glass of water on an empty stomach.    [provider]  apixaban (ELIQUIS) 5 MG TABS tablet Take 5 mg by mouth 2 (two) times daily.    [provider]  apixaban (ELIQUIS) 5 MG TABS tablet Take 5 mg by mouth 2 (two)  times daily.    [provider]  Calcium Carb-Cholecalciferol 500-400 MG-UNIT TABS Take 2 tablets by mouth at bedtime.    [provider]  calcium-vitamin D 250-100 MG-UNIT tablet Take 2 tablets by mouth 2 (two) times daily.    [provider]  citalopram (CELEXA) 10 MG tablet Take 10 mg by mouth daily.    [provider]  citalopram (CELEXA) 10 MG tablet Take 10 mg by mouth daily.    [provider]  furosemide (LASIX) 20 MG tablet Take 20 mg by mouth.    [provider]  hydrOXYzine (ATARAX/VISTARIL) 25 MG tablet Take 25 mg by mouth 2 (two) times daily as needed for anxiety.    [provider]  hydrOXYzine (ATARAX/VISTARIL) 25 MG tablet Take 25 mg by mouth 3 (three) times daily as needed.    [provider]  lovastatin (MEVACOR) 20 MG tablet Take 40 mg by mouth at bedtime.    [provider]  lovastatin (MEVACOR) 20 MG tablet Take 20 mg by mouth at bedtime.    [provider]  sodium chloride (OCEAN) 0.65 % SOLN nasal spray Place 1 spray into both nostrils daily as needed for congestion.    [provider]     Allergies Naproxen and Naproxen  History reviewed. No pertinent family history.  Social History Social History   Tobacco Use  . Smoking status: Former Smoker    Types: Cigarettes    Last attempt to quit: 08/28/1999    Years since quitting: 18.4  . Smokeless tobacco: Never Used  Substance Use Topics  . Alcohol use: Not Currently    Frequency: Never  . Drug use: Never    Review of Systems  Constitutional: No dizziness  ENT: No neck pain   Gastrointestinal: No abdominal pain.  No nausea, no vomiting.    Musculoskeletal: As above Skin: Negative for laceration Neurological: Negative for headaches or focal weakness    ____________________________________________   PHYSICAL EXAM:  VITAL SIGNS: ED Triage Vitals  Enc Vitals Group     BP 02/17/18 1703 131/65      Pulse Rate 02/17/18 1703 69     Resp 02/17/18 1703 18     Temp 02/17/18 1703 (!) 97.4 F (36.3 C)     Temp Source 02/17/18 1703 Oral     SpO2 02/17/18 1703 97 %     Weight 02/17/18 1704 72.8 kg (160 lb 7.9 oz)     Height --      Head Circumference --      Peak Flow --      Pain Score --      Pain Loc --      Pain Edu? --      Excl. in GC? --      Constitutional: Alert No acute distress. Pleasant and interactive Eyes: Conjunctivae are normal.  Head: Atraumatic. Nose: No congestion/rhinnorhea. Mouth/Throat: Mucous membranes are moist.  No vertebral tenderness to palpation of the cervical spine Cardiovascular: Normal rate, regular rhythm.  Respiratory: Normal respiratory effort.  No retractions. Genitourinary: deferred Musculoskeletal: Mild tenderness along the right superior humerus but no bony abnormalities or swelling full range of motion, 2+ distal pulses Neurologic:  Normal speech and language. No gross focal neurologic deficits are appreciated.   Skin:  Skin is warm, dry and intact. No rash noted.   ____________________________________________   LABS (all labs ordered are listed, but only abnormal results are displayed)  Labs Reviewed - No data to display ____________________________________________  EKG   ____________________________________________  RADIOLOGY  X-ray negative for fracture ____________________________________________   PROCEDURES  Procedure(s) performed: No  Procedures   Critical Care performed: No ____________________________________________   INITIAL IMPRESSION / ASSESSMENT AND PLAN / ED COURSE  Pertinent labs & imaging results that were available during my care of the patient were reviewed by me and considered in my medical decision making (see chart for details).  X-ray negative for fracture, reassuring exam, suspect muscle strain.  Supportive care outpatient follow-up as  needed   ____________________________________________   FINAL CLINICAL IMPRESSION(S) / ED DIAGNOSES  Final diagnoses:  Fall, initial encounter  Shoulder strain, right, initial encounter      NEW MEDICATIONS STARTED DURING THIS VISIT:  New Prescriptions   No medications on file     Note:  This document was prepared using Dragon voice recognition software and may include unintentional dictation errors.    Jene EveryKinner, Ivadell Gaul, MD 02/17/18 [id number]

## 2018-02-17 NOTE — ED Triage Notes (Addendum)
Pt to ed with daughter from [facility] with c/o fall last night and right shoulder pain. Pt confused but according to daughter, this is wnl for pt.  Pt denies hitting head. No obvious signs of trauma to head.  Pt with pain to right shoulder during palpation and movement. Decreased ROM in right shoulder.

## 2018-10-19 ENCOUNTER — Encounter: Payer: Self-pay | Admitting: Emergency Medicine

## 2018-10-19 ENCOUNTER — Emergency Department
Admission: EM | Admit: 2018-10-19 | Discharge: 2018-10-19 | Disposition: A | Discharge status: Other Acute Inpatient Hospital | Payer: Medicare Other | Attending: Emergency Medicine | Admitting: Emergency Medicine

## 2018-10-19 ENCOUNTER — Emergency Department: Payer: Medicare Other

## 2018-10-19 ENCOUNTER — Other Ambulatory Visit: Payer: Self-pay

## 2018-10-19 DIAGNOSIS — W06XXXA Fall from bed, initial encounter: Secondary | ICD-10-CM | POA: Diagnosis not present

## 2018-10-19 DIAGNOSIS — S0990XA Unspecified injury of head, initial encounter: Secondary | ICD-10-CM | POA: Insufficient documentation

## 2018-10-19 DIAGNOSIS — Z79899 Other long term (current) drug therapy: Secondary | ICD-10-CM | POA: Insufficient documentation

## 2018-10-19 DIAGNOSIS — Z87891 Personal history of nicotine dependence: Secondary | ICD-10-CM | POA: Diagnosis not present

## 2018-10-19 DIAGNOSIS — Y999 Unspecified external cause status: Secondary | ICD-10-CM | POA: Diagnosis not present

## 2018-10-19 DIAGNOSIS — Y939 Activity, unspecified: Secondary | ICD-10-CM | POA: Diagnosis not present

## 2018-10-19 DIAGNOSIS — Z7901 Long term (current) use of anticoagulants: Secondary | ICD-10-CM | POA: Insufficient documentation

## 2018-10-19 DIAGNOSIS — W19XXXA Unspecified fall, initial encounter: Secondary | ICD-10-CM

## 2018-10-19 DIAGNOSIS — E119 Type 2 diabetes mellitus without complications: Secondary | ICD-10-CM | POA: Diagnosis not present

## 2018-10-19 DIAGNOSIS — I509 Heart failure, unspecified: Secondary | ICD-10-CM | POA: Diagnosis not present

## 2018-10-19 DIAGNOSIS — Y929 Unspecified place or not applicable: Secondary | ICD-10-CM | POA: Diagnosis not present

## 2018-10-19 NOTE — ED Notes (Signed)
MD at bedside updated pt.

## 2018-10-19 NOTE — ED Notes (Signed)
Pt given more warm blankets, changed brief again after urine incontinence. Purewick placed by this RN. Pt transported to CT at this time.

## 2018-10-19 NOTE — ED Provider Notes (Signed)
[facility] Emergency Department Provider Note  ____________________________________________   I have reviewed the triage vital signs and the nursing notes. Where available I have reviewed prior notes and, if possible and indicated, outside hospital notes.   Patient seen and evaluated during the coronavirus epidemic during a time with low staffing  Patient seen for the symptoms described in the history of present illness. She was evaluated in the context of the global COVID-19 pandemic, which necessitated consideration that the patient might be at risk for infection with the SARS-CoV-2 virus that causes COVID-19. Institutional protocols and algorithms that pertain to the evaluation of patients at risk for COVID-19 are in a state of rapid change based on information released by regulatory bodies including the CDC and federal and state organizations. These policies and algorithms were followed during the patient's care in the ED.    HISTORY  Chief Complaint Fall    HPI Jasmine Hubbard is a 85 y.o. female  Who is on Eliquis, rolled out of bed this morning bumped her head.  She states that she feels "pretty good".  Does not have any complaints.  At baseline per EMS.   No syncopal symptoms no chest pain or shortness of breath no loss of consciousness no history of dementia.  Past Medical History:  Diagnosis Date  . CHF (congestive heart failure) (HCC)   . Diabetes mellitus without complication (HCC)   . Left ventricular failure (HCC)   . Osteoporosis   . Pulmonary embolism (HCC)   . Pulmonary embolus (HCC)   . Vascular dementia (HCC)     There are no active problems to display for this patient.   Past Surgical History:  Procedure Laterality Date  . ABDOMINAL HYSTERECTOMY    . BACK SURGERY    . FRACTURE SURGERY     elbow    Prior to Admission medications   Medication Sig Start Date End Date Taking? Authorizing Provider  acetaminophen (TYLENOL)  325 MG tablet Take 650 mg by mouth every 6 (six) hours as needed for mild pain.    [provider]  acetaminophen (TYLENOL) 325 MG tablet Take 650 mg by mouth every 6 (six) hours as needed.    [provider]  alendronate (FOSAMAX) 70 MG tablet Take 70 mg by mouth once a week. Take with a full glass of water on an empty stomach.    [provider]  alendronate (FOSAMAX) 70 MG tablet Take 70 mg by mouth once a week. Take with a full glass of water on an empty stomach.    [provider]  apixaban (ELIQUIS) 5 MG TABS tablet Take 5 mg by mouth 2 (two) times daily.    [provider]  apixaban (ELIQUIS) 5 MG TABS tablet Take 5 mg by mouth 2 (two) times daily.    [provider]  Calcium Carb-Cholecalciferol 500-400 MG-UNIT TABS Take 2 tablets by mouth at bedtime.    [provider]  calcium-vitamin D 250-100 MG-UNIT tablet Take 2 tablets by mouth 2 (two) times daily.    [provider]  citalopram (CELEXA) 10 MG tablet Take 10 mg by mouth daily.    [provider]  citalopram (CELEXA) 10 MG tablet Take 10 mg by mouth daily.    [provider]  furosemide (LASIX) 20 MG tablet Take 20 mg by mouth.    [provider]  hydrOXYzine (ATARAX/VISTARIL) 25 MG tablet Take 25 mg by mouth 2 (two) times daily as needed  for anxiety.    [provider]  hydrOXYzine (ATARAX/VISTARIL) 25 MG tablet Take 25 mg by mouth 3 (three) times daily as needed.    [provider]  lovastatin (MEVACOR) 20 MG tablet Take 40 mg by mouth at bedtime.    [provider]  lovastatin (MEVACOR) 20 MG tablet Take 20 mg by mouth at bedtime.    [provider]  sodium chloride (OCEAN) 0.65 % SOLN nasal spray Place 1 spray into both nostrils daily as needed for congestion.    [provider]    Allergies Naproxen and Naproxen  No family history on file.  Social History Social History   Tobacco  Use  . Smoking status: Former Smoker    Types: Cigarettes    Quit date: 08/28/1999    Years since quitting: 19.1  . Smokeless tobacco: Never Used  Substance Use Topics  . Alcohol use: Not Currently    Frequency: Never  . Drug use: Never    Review of Systems Constitutional: No fever/chills Eyes: No visual changes. ENT: No sore throat. No stiff neck no neck pain Cardiovascular: Denies chest pain. Respiratory: Denies shortness of breath. Gastrointestinal:   no vomiting.  No diarrhea.  No constipation. Genitourinary: Negative for dysuria. Musculoskeletal: Negative lower extremity swelling Skin: Negative for rash. Neurological: Negative for severe headaches, focal weakness or numbness.   ____________________________________________   PHYSICAL EXAM:  VITAL SIGNS: ED Triage Vitals  Enc Vitals Group     BP 10/19/18 1041 132/77     Pulse Rate 10/19/18 1041 62     Resp 10/19/18 1041 20     Temp 10/19/18 1041 97.8 F (36.6 C)     Temp Source 10/19/18 1041 Oral     SpO2 10/19/18 1041 97 %     Weight 10/19/18 1036 140 lb (63.5 kg)     Height 10/19/18 1036 5\' 6"  (1.676 m)     Head Circumference --      Peak Flow --      Pain Score --      Pain Loc --      Pain Edu? --      Excl. in GC? --     Constitutional: Alert and oriented to name place [place] unsure of the exact date well appearing and in no acute distress. Eyes: Conjunctivae are normal Head: Hematoma to forehead HEENT: No congestion/rhinnorhea. Mucous membranes are moist.  Oropharynx non-erythematous Neck:   Nontender with no meningismus, no masses, no stridor Cardiovascular: Normal rate, regular rhythm. Grossly normal heart sounds.  Good peripheral circulation. Respiratory: Normal respiratory effort.  No retractions. Lungs CTAB. Abdominal: Soft and nontender. No distention. No guarding no rebound Back:  There is no focal tenderness or step off.  there is no midline tenderness  Musculoskeletal: No lower  extremity tenderness, no upper extremity tenderness. No joint effusions, no DVT signs strong distal pulses no edema Neurologic:  Normal speech and language. No gross focal neurologic deficits are appreciated.  Skin:  Skin is warm, dry and intact. No rash noted. Psychiatric: Mood and affect are normal. Speech and behavior are normal.  ____________________________________________   LABS (all labs ordered are listed, but only abnormal results are displayed)  Labs Reviewed - No data to display  Pertinent labs  results that were available during my care of the patient were reviewed by me and considered in my medical decision making (see chart for details). ____________________________________________  EKG  I personally interpreted any EKGs ordered by me  or triage  ____________________________________________  RADIOLOGY  Pertinent labs & imaging results that were available during my care of the patient were reviewed by me and considered in my medical decision making (see chart for details). If possible, patient and/or family made aware of any abnormal findings.  No results found. ____________________________________________    PROCEDURES  Procedure(s) performed: None  Procedures  Critical Care performed: None  ____________________________________________   INITIAL IMPRESSION / ASSESSMENT AND PLAN / ED COURSE  Pertinent labs & imaging results that were available during my care of the patient were reviewed by me and considered in my medical decision making (see chart for details).   Patient here after a non-syncopal fall with a bump on the head, but she is on Eliquis will obtain CT scan.  Given dementia, and anterior forehead contusion I will also obtain CT C-spine.  Patient in no acute distress otherwise.  I do not think further work-up is indicated at this moment.    ____________________________________________   FINAL CLINICAL IMPRESSION(S) / ED DIAGNOSES  Final  diagnoses:  None      This chart was dictated using voice recognition software.  Despite best efforts to proofread,  errors can occur which can change meaning.      Schuyler Amor, MD 10/19/18 1046

## 2018-10-19 NOTE — ED Triage Notes (Signed)
Pt presents to ED via [facility] from [place], per EMS pt rolled out of bed this morning and fell onto floor. EMS reports pt hit her head when she fell, but was able to get up and ambulate after the fall. EMS reports per [place] pt is at baseline. Pt presents with start of hematoma to center of forehead. EMS reports pt is currently on Eliquis.    125/77 65 97% RA

## 2018-10-19 NOTE — Discharge Instructions (Signed)
Please hold the next two doses of eliquis.

## 2018-12-19 ENCOUNTER — Other Ambulatory Visit: Payer: Self-pay

## 2018-12-19 ENCOUNTER — Emergency Department: Payer: Medicare Other

## 2018-12-19 ENCOUNTER — Emergency Department
Admission: EM | Admit: 2018-12-19 | Discharge: 2018-12-19 | Disposition: A | Discharge status: Home / Self Care | Payer: Medicare Other | Attending: Emergency Medicine | Admitting: Emergency Medicine

## 2018-12-19 DIAGNOSIS — Z7901 Long term (current) use of anticoagulants: Secondary | ICD-10-CM | POA: Insufficient documentation

## 2018-12-19 DIAGNOSIS — N390 Urinary tract infection, site not specified: Secondary | ICD-10-CM

## 2018-12-19 DIAGNOSIS — E119 Type 2 diabetes mellitus without complications: Secondary | ICD-10-CM | POA: Diagnosis not present

## 2018-12-19 DIAGNOSIS — I509 Heart failure, unspecified: Secondary | ICD-10-CM | POA: Diagnosis not present

## 2018-12-19 DIAGNOSIS — Z87891 Personal history of nicotine dependence: Secondary | ICD-10-CM | POA: Insufficient documentation

## 2018-12-19 DIAGNOSIS — Z79899 Other long term (current) drug therapy: Secondary | ICD-10-CM | POA: Insufficient documentation

## 2018-12-19 DIAGNOSIS — I110 Hypertensive heart disease with heart failure: Secondary | ICD-10-CM | POA: Diagnosis not present

## 2018-12-19 DIAGNOSIS — F0150 Vascular dementia without behavioral disturbance: Secondary | ICD-10-CM | POA: Diagnosis not present

## 2018-12-19 DIAGNOSIS — R4182 Altered mental status, unspecified: Secondary | ICD-10-CM | POA: Diagnosis present

## 2018-12-19 LAB — URINALYSIS, COMPLETE (UACMP) WITH MICROSCOPIC
Bilirubin Urine: NEGATIVE
Glucose, UA: NEGATIVE mg/dL
Hgb urine dipstick: NEGATIVE
Ketones, ur: NEGATIVE mg/dL
Nitrite: POSITIVE — AB
Protein, ur: NEGATIVE mg/dL
Specific Gravity, Urine: 1.011 (ref 1.005–1.030)
pH: 6 (ref 5.0–8.0)

## 2018-12-19 LAB — CBC WITH DIFFERENTIAL/PLATELET
Abs Immature Granulocytes: 0.05 10*3/uL (ref 0.00–0.07)
Basophils Absolute: 0.1 10*3/uL (ref 0.0–0.1)
Basophils Relative: 1 %
Eosinophils Absolute: 0.1 10*3/uL (ref 0.0–0.5)
Eosinophils Relative: 1 %
HCT: 34.8 % — ABNORMAL LOW (ref 36.0–46.0)
Hemoglobin: 11.6 g/dL — ABNORMAL LOW (ref 12.0–15.0)
Immature Granulocytes: 1 %
Lymphocytes Relative: 27 %
Lymphs Abs: 1.9 10*3/uL (ref 0.7–4.0)
MCH: 29.5 pg (ref 26.0–34.0)
MCHC: 33.3 g/dL (ref 30.0–36.0)
MCV: 88.5 fL (ref 80.0–100.0)
Monocytes Absolute: 0.7 10*3/uL (ref 0.1–1.0)
Monocytes Relative: 10 %
Neutro Abs: 4.2 10*3/uL (ref 1.7–7.7)
Neutrophils Relative %: 60 %
Platelets: 222 10*3/uL (ref 150–400)
RBC: 3.93 MIL/uL (ref 3.87–5.11)
RDW: 13.7 % (ref 11.5–15.5)
WBC: 7.1 10*3/uL (ref 4.0–10.5)
nRBC: 0 % (ref 0.0–0.2)

## 2018-12-19 LAB — COMPREHENSIVE METABOLIC PANEL
ALT: 9 U/L (ref 0–44)
AST: 14 U/L — ABNORMAL LOW (ref 15–41)
Albumin: 3.8 g/dL (ref 3.5–5.0)
Alkaline Phosphatase: 39 U/L (ref 38–126)
Anion gap: 11 (ref 5–15)
BUN: 12 mg/dL (ref 8–23)
CO2: 30 mmol/L (ref 22–32)
Calcium: 8.6 mg/dL — ABNORMAL LOW (ref 8.9–10.3)
Chloride: 99 mmol/L (ref 98–111)
Creatinine, Ser: 1.22 mg/dL — ABNORMAL HIGH (ref 0.44–1.00)
GFR calc Af Amer: 47 mL/min — ABNORMAL LOW (ref >60)
GFR calc non Af Amer: 41 mL/min — ABNORMAL LOW (ref >60)
Glucose, Bld: 192 mg/dL — ABNORMAL HIGH (ref 70–99)
Potassium: 3.4 mmol/L — ABNORMAL LOW (ref 3.5–5.1)
Sodium: 140 mmol/L (ref 135–145)
Total Bilirubin: 1 mg/dL (ref 0.3–1.2)
Total Protein: 7 g/dL (ref 6.5–8.1)

## 2018-12-19 LAB — TROPONIN I (HIGH SENSITIVITY)
Troponin I (High Sensitivity): 11 ng/L (ref <18)
Troponin I (High Sensitivity): 11 ng/L (ref <18)

## 2018-12-19 MED ORDER — SODIUM CHLORIDE 0.9 % IV BOLUS
1000.0000 mL | Freq: Once | INTRAVENOUS | Status: AC
  Administered 2018-12-19: 1000 mL via INTRAVENOUS

## 2018-12-19 MED ORDER — CEFUROXIME AXETIL 250 MG PO TABS: 250.0000 mg | ORAL_TABLET | Freq: Two times a day (BID) | ORAL | 0 refills | Status: AC

## 2018-12-19 MED ORDER — SODIUM CHLORIDE 0.9 % IV SOLN
1.0000 g | Freq: Once | INTRAVENOUS | Status: AC
  Administered 2018-12-19: 1 g via INTRAVENOUS
  Filled 2018-12-19: qty 10

## 2018-12-19 NOTE — ED Provider Notes (Signed)
[facility] Emergency Department Provider Note  ____________________________________________   I have reviewed the triage vital signs and the nursing notes.   HISTORY  Chief Complaint Altered Mental Status   History limited by and level 5 caveat due to: AMS/dementia   HPI Jasmine Hubbard is a 85 y.o. female who presents to the emergency department today from living facility because of concerns for altered mental status.  Patient does have a history of dementia.  Apparently today at lunch she was noted to be a bit weaker.  She was not as talkative is normal.  For EMS she was not completely oriented.  Patient cannot give any significant or meaningful history.   Records reviewed. Per medical record review patient has a history of dementia, CHF, PE.  Past Medical History:  Diagnosis Date  . CHF (congestive heart failure) ([place])   . Diabetes mellitus without complication ([place])   . Left ventricular failure ([place])   . Osteoporosis   . Pulmonary embolism ([place])   . Pulmonary embolus ([place])   . Vascular dementia ([place])     There are no active problems to display for this patient.   Past Surgical History:  Procedure Laterality Date  . ABDOMINAL HYSTERECTOMY    . BACK SURGERY    . FRACTURE SURGERY     elbow    Prior to Admission medications   Medication Sig Start Date End Date Taking? Authorizing Provider  acetaminophen (TYLENOL) 325 MG tablet Take 650 mg by mouth every 6 (six) hours as needed for mild pain.    [provider]  alendronate (FOSAMAX) 70 MG tablet Take 70 mg by mouth once a week. Take with a full glass of water on an empty stomach.    [provider]  alum & mag hydroxide-simeth ([address]) 200-200-20 MG/5ML suspension Take 30 mLs by mouth every 6 (six) hours as needed for indigestion or heartburn.    [provider]  apixaban (ELIQUIS) 2.5 MG TABS tablet Take 2.5 mg by mouth 2 (two) times daily.     [provider]  Calcium Carb-Cholecalciferol 600-400 MG-UNIT TABS Take 1 tablet by mouth 2 (two) times a day.     [provider]  Calcium Citrate-Vitamin D 250-200 MG-UNIT TABS Take 4 tablets by mouth at bedtime.     [provider]  citalopram (CELEXA) 10 MG tablet Take 10 mg by mouth daily.    [provider]  furosemide (LASIX) 20 MG tablet Take 20 mg by mouth daily.     [provider]  hydrOXYzine (ATARAX/VISTARIL) 25 MG tablet Take 25 mg by mouth 2 (two) times daily as needed for anxiety.    [provider]  loperamide (IMODIUM) 2 MG capsule Take 2 mg by mouth as needed for diarrhea or loose stools.    [provider]  lovastatin (MEVACOR) 20 MG tablet Take 40 mg by mouth at bedtime.    [provider]  magnesium hydroxide (MILK OF MAGNESIA) 400 MG/5ML suspension Take 30 mLs by mouth daily as needed for mild constipation.    [provider]  metFORMIN (GLUCOPHAGE) 500 MG tablet Take 500 mg by mouth 2 (two) times daily with a meal.    [provider]  sodium chloride (OCEAN) 0.65 % SOLN nasal spray Place 1 spray into both nostrils 3 (three) times daily.     [provider]    Allergies Naproxen  No family history on file.  Social History Social History  Tobacco Use  . Smoking status: Former Smoker    Types: Cigarettes    Quit date: 08/28/1999    Years since quitting: 19.3  . Smokeless tobacco: Never Used  Substance Use Topics  . Alcohol use: Not Currently    Frequency: Never  . Drug use: Never    Review of Systems Unable to obtain reliable ROS secondary to dementia.  ____________________________________________   PHYSICAL EXAM:  VITAL SIGNS: ED Triage Vitals [12/19/18 1347]  Enc Vitals Group     BP      Pulse      Resp      Temp      Temp src      SpO2      Weight 130 lb (59 kg)     Height 5\' 5"  (1.651 m)     Head Circumference      Peak Flow      Pain Score 0     Pain  Loc      Pain Edu?      Excl. in GC?      Constitutional: Awake and alert. Oriented to first name only. Eyes: Conjunctivae are normal.  ENT      Head: Normocephalic and atraumatic.      Nose: No congestion/rhinnorhea.      Mouth/Throat: Mucous membranes are moist.      Neck: No stridor. Hematological/Lymphatic/Immunilogical: No cervical lymphadenopathy. Cardiovascular: Normal rate, regular rhythm.  No murmurs, rubs, or gallops.  Respiratory: Normal respiratory effort without tachypnea nor retractions. Breath sounds are clear and equal bilaterally. No wheezes/rales/rhonchi. Gastrointestinal: Soft and non tender. No rebound. No guarding.  Genitourinary: Deferred Musculoskeletal: Normal range of motion in all extremities. No lower extremity edema. Neurologic:  Oriented to name only. Moving all extremities.  Skin:  Skin is warm, dry and intact. No rash noted. Psychiatric: Mood and affect are normal. Speech and behavior are normal. Patient exhibits appropriate insight and judgment.  ____________________________________________    LABS (pertinent positives/negatives)  Trop hs 11 CBC wbc 7.1, hgb 11.6, plt 222 CMP na 140, k 3.4, cr 1.22  ____________________________________________   EKG  I, , attending physician, personally viewed and interpreted this EKG  EKG Time: 1355 Rate: 61 Rhythm: junctional vs sinus  Axis: normal Intervals: qtc 459 QRS: narrow, q waves v1 ST changes: no st elevation Impression: abnormal ekg   ____________________________________________    RADIOLOGY  CT head No acute abnormality  ____________________________________________   PROCEDURES  Procedures  ____________________________________________   INITIAL IMPRESSION / ASSESSMENT AND PLAN / ED COURSE  Pertinent labs & imaging results that were available during my care of the patient were reviewed by me and considered in my medical decision making (see chart for  details).   Patient presented to the emergency department today because of concerns for altered mental status from living facility.  Patient does have history of dementia.  She is only oriented to first name here.  Will check broad work-up for causes of altered mental status including intracranial pathology, infection, electrolyte abnormality, signs of dehydration amongst other abnormalities.   ____________________________________________   FINAL CLINICAL IMPRESSION(S) / ED DIAGNOSES  Altered mental status  Note: This dictation was prepared with Dragon dictation. Any transcriptional errors that result from this process are unintentional     Phineas Semen, MD 12/20/18 [id number]

## 2018-12-19 NOTE — ED Notes (Signed)
Lab called to add on troponin to previous collection 

## 2018-12-19 NOTE — ED Triage Notes (Signed)
Pt arrived via EMS from [facility] for altered mental status, staff stated pt was "sitting in dining hall with face in food" and "not acting herself". EMS reports dementia, type 2 diabetic, blood thinner use. Pt oriented to person at this time.

## 2018-12-19 NOTE — Discharge Instructions (Addendum)
CT head was negative for new acute pathology.  We started her on antibiotic for a UTI.  We have given her first dose here and she can get her next dose tomorrow morning.  Return to the ER for worsening confusion, fevers, any other concerns.

## 2018-12-19 NOTE — ED Provider Notes (Signed)
3:51 PM Assumed care for off going team.   Blood pressure (!) 112/59, pulse 62, temperature 97.7 F (36.5 C), temperature source Oral, resp. rate 19, height 5\' 5"  (1.651 m), weight 59 kg, SpO2 98 %.  See their HPI for full report but in brief  Nursing home altered, dementia at baseline.  CT head and UA and plan to d/c.   Cr 1.22, troponin of 11-->11, CT head negative.    Ua consistent with UTI.   No prior cultures on file.  Reevaluated patient.  Patient looks very comfortable in bed.  She has no evidence of sepsis.  Daughter is at bedside and says that she looks like she is at her baseline self.  They feel comfortable with patient going back to the facility.  We will give a dose ceftriaxone and send her on cefuroxime.  Urine culture is pending.  I discussed the provisional nature of ED diagnosis, the treatment so far, the ongoing plan of care, follow up appointments and return precautions with the patient and any family or support people present. They expressed understanding and agreed with the plan, discharged home.               Vanessa [place], MD 12/19/18 [phone]

## 2018-12-21 LAB — URINE CULTURE: Culture: >=100000 — AB

## 2019-01-05 ENCOUNTER — Other Ambulatory Visit: Payer: Self-pay

## 2019-01-05 ENCOUNTER — Encounter: Payer: Self-pay | Admitting: *Deleted

## 2019-01-05 ENCOUNTER — Emergency Department: Payer: Medicare Other

## 2019-01-05 ENCOUNTER — Emergency Department
Admission: EM | Admit: 2019-01-05 | Discharge: 2019-01-05 | Disposition: A | Discharge status: Home / Self Care | Payer: Medicare Other | Attending: Emergency Medicine | Admitting: Emergency Medicine

## 2019-01-05 DIAGNOSIS — R462 Strange and inexplicable behavior: Secondary | ICD-10-CM | POA: Diagnosis present

## 2019-01-05 DIAGNOSIS — E119 Type 2 diabetes mellitus without complications: Secondary | ICD-10-CM | POA: Diagnosis not present

## 2019-01-05 DIAGNOSIS — I509 Heart failure, unspecified: Secondary | ICD-10-CM | POA: Insufficient documentation

## 2019-01-05 DIAGNOSIS — Z7901 Long term (current) use of anticoagulants: Secondary | ICD-10-CM | POA: Insufficient documentation

## 2019-01-05 DIAGNOSIS — Z87891 Personal history of nicotine dependence: Secondary | ICD-10-CM | POA: Insufficient documentation

## 2019-01-05 DIAGNOSIS — Z79899 Other long term (current) drug therapy: Secondary | ICD-10-CM | POA: Diagnosis not present

## 2019-01-05 DIAGNOSIS — N3000 Acute cystitis without hematuria: Secondary | ICD-10-CM

## 2019-01-05 LAB — CBC WITH DIFFERENTIAL/PLATELET
Abs Immature Granulocytes: 0.03 10*3/uL (ref 0.00–0.07)
Basophils Absolute: 0 10*3/uL (ref 0.0–0.1)
Basophils Relative: 1 %
Eosinophils Absolute: 0.1 10*3/uL (ref 0.0–0.5)
Eosinophils Relative: 1 %
HCT: 33.3 % — ABNORMAL LOW (ref 36.0–46.0)
Hemoglobin: 11.1 g/dL — ABNORMAL LOW (ref 12.0–15.0)
Immature Granulocytes: 0 %
Lymphocytes Relative: 19 %
Lymphs Abs: 1.5 10*3/uL (ref 0.7–4.0)
MCH: 29.5 pg (ref 26.0–34.0)
MCHC: 33.3 g/dL (ref 30.0–36.0)
MCV: 88.6 fL (ref 80.0–100.0)
Monocytes Absolute: 0.7 10*3/uL (ref 0.1–1.0)
Monocytes Relative: 9 %
Neutro Abs: 5.2 10*3/uL (ref 1.7–7.7)
Neutrophils Relative %: 70 %
Platelets: 178 10*3/uL (ref 150–400)
RBC: 3.76 MIL/uL — ABNORMAL LOW (ref 3.87–5.11)
RDW: 13.5 % (ref 11.5–15.5)
WBC: 7.6 10*3/uL (ref 4.0–10.5)
nRBC: 0 % (ref 0.0–0.2)

## 2019-01-05 LAB — URINALYSIS, COMPLETE (UACMP) WITH MICROSCOPIC
Bilirubin Urine: NEGATIVE
Glucose, UA: NEGATIVE mg/dL
Hgb urine dipstick: NEGATIVE
Ketones, ur: NEGATIVE mg/dL
Nitrite: NEGATIVE
Protein, ur: NEGATIVE mg/dL
Specific Gravity, Urine: 1.009 (ref 1.005–1.030)
pH: 5 (ref 5.0–8.0)

## 2019-01-05 LAB — COMPREHENSIVE METABOLIC PANEL
ALT: 11 U/L (ref 0–44)
AST: 16 U/L (ref 15–41)
Albumin: 4 g/dL (ref 3.5–5.0)
Alkaline Phosphatase: 43 U/L (ref 38–126)
Anion gap: 13 (ref 5–15)
BUN: 14 mg/dL (ref 8–23)
CO2: 30 mmol/L (ref 22–32)
Calcium: 9.3 mg/dL (ref 8.9–10.3)
Chloride: 98 mmol/L (ref 98–111)
Creatinine, Ser: 1.03 mg/dL — ABNORMAL HIGH (ref 0.44–1.00)
GFR calc Af Amer: 58 mL/min — ABNORMAL LOW (ref >60)
GFR calc non Af Amer: 50 mL/min — ABNORMAL LOW (ref >60)
Glucose, Bld: 127 mg/dL — ABNORMAL HIGH (ref 70–99)
Potassium: 3.2 mmol/L — ABNORMAL LOW (ref 3.5–5.1)
Sodium: 141 mmol/L (ref 135–145)
Total Bilirubin: 1.2 mg/dL (ref 0.3–1.2)
Total Protein: 7.2 g/dL (ref 6.5–8.1)

## 2019-01-05 LAB — TROPONIN I (HIGH SENSITIVITY): Troponin I (High Sensitivity): 13 ng/L (ref <18)

## 2019-01-05 MED ORDER — CEPHALEXIN 250 MG PO CAPS: 250.0000 mg | ORAL_CAPSULE | Freq: Two times a day (BID) | ORAL | 0 refills | Status: AC

## 2019-01-05 NOTE — ED Notes (Signed)
RN called pts daughter, Abigail Butts to update. Abigail Butts reports pt is normally alert to self and place but never knows the year and rarely aware of the situation.

## 2019-01-05 NOTE — ED Notes (Signed)
Pt ambulated to toilet with walker without difficulty.

## 2019-01-05 NOTE — ED Notes (Signed)
Called [facility] for transfer to [facility]

## 2019-01-05 NOTE — ED Notes (Signed)
Pt unable to sign for discharge. Pt's daughter understands instructions and requested EMS transfer back to [facility].

## 2019-01-05 NOTE — ED Provider Notes (Signed)
[facility] Emergency Department Provider Note ____________________________________________   None    (approximate)  I have reviewed the triage vital signs and the nursing notes.   HISTORY  Chief Complaint R/O Stroke  HPI Jasmine Hubbard is a 85 y.o. female who presents to the emergency department via EMS from [facility].  Staff state that today she has been acting differently.  They state that this morning she did not come down for breakfast and they have noticed that she is "holding her right hand differently."  They also state that she seems to be more confused than her baseline dementia.  Patient is very pleasant and denies any complaints.  She denies any pain.  She states that she does not know why she was brought to the emergency department.  Patient states that she did not go to breakfast this morning because she was not hungry.         Past Medical History:  Diagnosis Date  . CHF (congestive heart failure) (HCC)   . Diabetes mellitus without complication (HCC)   . Left ventricular failure (HCC)   . Osteoporosis   . Pulmonary embolism (HCC)   . Pulmonary embolus (HCC)   . Vascular dementia (HCC)     There are no active problems to display for this patient.   Past Surgical History:  Procedure Laterality Date  . ABDOMINAL HYSTERECTOMY    . BACK SURGERY    . FRACTURE SURGERY     elbow    Prior to Admission medications   Medication Sig Start Date End Date Taking? Authorizing Provider  acetaminophen (TYLENOL) 325 MG tablet Take 650 mg by mouth every 6 (six) hours as needed for mild pain.   Yes [provider]  alendronate (FOSAMAX) 70 MG tablet Take 70 mg by mouth every Friday. Take with a full glass of water on an empty stomach.    Yes [provider]  alum & mag hydroxide-simeth (MINTOX) 200-200-20 MG/5ML suspension Take 30 mLs by mouth every 6 (six) hours as needed for indigestion or heartburn.   Yes  [provider]  apixaban (ELIQUIS) 2.5 MG TABS tablet Take 2.5 mg by mouth 2 (two) times daily.    Yes [provider]  Calcium Carb-Cholecalciferol 600-400 MG-UNIT TABS Take 1 tablet by mouth 2 (two) times a day.    Yes [provider]  Calcium Citrate-Vitamin D 250-200 MG-UNIT TABS Take 2 tablets by mouth at bedtime.    Yes [provider]  citalopram (CELEXA) 10 MG tablet Take 10 mg by mouth daily.   Yes [provider]  furosemide (LASIX) 20 MG tablet Take 20 mg by mouth daily.    Yes [provider]  guaifenesin (ROBITUSSIN) 100 MG/5ML syrup Take 200 mg by mouth every 6 (six) hours as needed for cough or congestion. (max 4 doses daily)   Yes [provider]  hydrOXYzine (ATARAX/VISTARIL) 25 MG tablet Take 25 mg by mouth 2 (two) times daily as needed for anxiety.   Yes [provider]  loperamide (IMODIUM) 2 MG capsule Take 2 mg by mouth as needed for diarrhea or loose stools. (max 8 doses in 24 hours)   Yes [provider]  lovastatin (MEVACOR) 20 MG tablet Take 40 mg by mouth at bedtime.   Yes [provider]  magnesium hydroxide (MILK OF MAGNESIA) 400 MG/5ML suspension Take 30 mLs by mouth daily as needed for mild constipation.   Yes [provider]  metFORMIN (GLUCOPHAGE) 500 MG tablet Take 500 mg by mouth 2 (two) times daily with a meal.   Yes [provider]  neomycin-bacitracin-polymyxin (NEOSPORIN) OINT Apply 1 application topically 4 (four) times daily as needed for wound care.   Yes [provider]  sodium chloride (OCEAN) 0.65 % SOLN nasal spray Place 1 spray into both nostrils 3 (three) times daily.    Yes [provider]  cephALEXin (KEFLEX) 250 MG capsule Take 1 capsule (250 mg total) by mouth 2 (two) times daily for 7 days. 01/05/19 01/12/19  Sherrie George B, FNP    Allergies Naproxen  History reviewed. No pertinent family history.  Social History  Social History   Tobacco Use  . Smoking status: Former Smoker    Types: Cigarettes    Quit date: 08/28/1999    Years since quitting: 19.3  . Smokeless tobacco: Never Used  Substance Use Topics  . Alcohol use: Not Currently    Frequency: Never  . Drug use: Never    Review of Systems  Level 5 caveat : Dementia  ____________________________________________   PHYSICAL EXAM:  VITAL SIGNS: ED Triage Vitals  Enc Vitals Group     BP 01/05/19 1430 120/72     Pulse Rate 01/05/19 1430 67     Resp 01/05/19 1430 15     Temp 01/05/19 1430 (!) 97.4 F (36.3 C)     Temp Source 01/05/19 1430 Oral     SpO2 01/05/19 1430 96 %     Weight 01/05/19 1432 130 lb 1.1 oz (59 kg)     Height --      Head Circumference --      Peak Flow --      Pain Score 01/05/19 1432 0     Pain Loc --      Pain Edu? --      Excl. in Diagonal? --     Constitutional: Alert and oriented to person. Well appearing and in no acute distress. Eyes: Conjunctivae are normal. PERRL. EOMI. Head: Atraumatic. Nose: No congestion/rhinnorhea. Mouth/Throat: Mucous membranes are moist.  Oropharynx non-erythematous. Neck: No stridor.   Hematological/Lymphatic/Immunilogical: No cervical lymphadenopathy. Cardiovascular: Normal rate, regular rhythm. Grossly normal heart sounds.  Good peripheral circulation. Respiratory: Normal respiratory effort.  No retractions. Lungs CTAB. Gastrointestinal: Soft and nontender. No distention. No abdominal bruits. Musculoskeletal: No lower extremity tenderness nor edema.  No joint effusions. Neurologic:  Normal speech and language. No gross focal neurologic deficits are appreciated.  No facial droop.  Tongue protrudes midline.  No pronator drift.  Able to demonstrate movement of lower extremities upon request. Skin:  Skin is warm, dry and intact. No rash noted. Psychiatric: Pleasantly confused  ____________________________________________   LABS (all labs ordered are listed, but only abnormal  results are displayed)  Labs Reviewed  COMPREHENSIVE METABOLIC PANEL - Abnormal; Notable for the following components:      Result Value   Potassium 3.2 (*)    Glucose, Bld 127 (*)    Creatinine, Ser 1.03 (*)    GFR calc non Af Amer 50 (*)    GFR calc Af Amer 58 (*)    All other components within normal limits  CBC WITH DIFFERENTIAL/PLATELET - Abnormal; Notable for the following components:   RBC 3.76 (*)    Hemoglobin 11.1 (*)    HCT 33.3 (*)    All other components within normal limits  URINALYSIS, COMPLETE (UACMP) WITH MICROSCOPIC - Abnormal; Notable for the following components:   Color, Urine YELLOW (*)  APPearance CLEAR (*)    Leukocytes,Ua SMALL (*)    Bacteria, UA RARE (*)    All other components within normal limits  CBG MONITORING, ED  TROPONIN I (HIGH SENSITIVITY)   ____________________________________________  EKG  ED ECG REPORT I, Luka Stohr, FNP-BC personally viewed and interpreted this ECG.   Date: 01/05/2019  EKG Time: 1434  Rate: 66  Rhythm: normal EKG, normal sinus rhythm  Axis: left axis deviation  Intervals:none  ST&T Change: no ST elevation  ____________________________________________  RADIOLOGY  ED MD interpretation:    CT head is negative for acute abnormality.  There is atrophy and extensive chronic microvascular ischemic changes  Chest x-ray shows no acute cardiopulmonary abnormality per radiology.  Official radiology report(s): Ct Head Wo Contrast  Result Date: 01/05/2019 CLINICAL DATA:  Altered behavior today. EXAM: CT HEAD WITHOUT CONTRAST TECHNIQUE: Contiguous axial images were obtained from the base of the skull through the vertex without intravenous contrast. COMPARISON:  Head CT 12/19/2018. FINDINGS: Brain: No evidence of acute infarction, hemorrhage, hydrocephalus, extra-axial collection or mass lesion/mass effect. Atrophy and extensive chronic microvascular ischemic change. Remote thalamic, basal ganglia and pontine lacunar  infarctions noted. Vascular: No hyperdense vessel or unexpected calcification. Skull: Intact.  No focal lesion. Sinuses/Orbits: Negative. Other: None. IMPRESSION: No acute abnormality. Atrophy and extensive chronic microvascular ischemic change. Electronically Signed   By: Drusilla Kanner M.D.   On: 01/05/2019 15:38   Dg Chest Portable 1 View  Result Date: 01/05/2019 CLINICAL DATA:  Altered mental status.  Rule out stroke. EXAM: PORTABLE CHEST 1 VIEW COMPARISON:  09/03/2009 FINDINGS: Numerous leads and wires project over the chest. Midline trachea. Normal heart size. Tortuous thoracic aorta. Atherosclerosis in the transverse aorta. Clear lungs. IMPRESSION: No acute cardiopulmonary disease. Aortic Atherosclerosis (ICD10-I70.0). Electronically Signed   By: Jeronimo Greaves M.D.   On: 01/05/2019 15:45    ____________________________________________   PROCEDURES  Procedure(s) performed (including Critical Care):  Procedures  ____________________________________________   INITIAL IMPRESSION / ASSESSMENT AND PLAN     85 year old female presenting to the emergency department for increase in confusion and not acting herself according to the staff at [facility].  See HPI for further details.  Exam is overall reassuring as she does not have any gross neurological deficits.  Will proceed with CT of the head and lab studies plus urinalysis.   DIFFERENTIAL DIAGNOSIS  TIA, CVA, worsening dementia, urinary tract infection.  ED COURSE  CT of the head is reassuring as are her lab studies.  She is slightly hypokalemic at 3.2 which based on labs 2 weeks ago seems to be very close to her baseline.  Daughter is at bedside and states that she has not noticed any difference in her mom.  Daughter states that she occasionally becomes more confused then returns to baseline.  She states that she feels that this is part of the dementia process.  Patient is able to get up and use her walker to ambulate to  the toilet.  Gait was steady with the walker.  She was able to use her right hand to get undressed to use the bathroom and then dressed again.  She is smiling and talkative.  Urinalysis does show that she still has a small amount of leukocytes and rare bacteria with 6-10 white blood cells.  This has improved from her sample 2 weeks ago, but patient states that she still has some urinary frequency.  Review of the urine culture from 2 days ago shows that it contain E.  coli and is sensitive to Keflex.  She will be given a 7-day course of Keflex at discharge.  Daughter has requested that she be transported back to [facility] via EMS.  ____________________________________________   FINAL CLINICAL IMPRESSION(S) / ED DIAGNOSES  Final diagnoses:  Acute cystitis without hematuria     ED Discharge Orders         Ordered    cephALEXin (KEFLEX) 250 MG capsule  2 times daily     01/05/19 1758           Note:  This document was prepared using Dragon voice recognition software and may include unintentional dictation errors.   Chinita Pester, FNP 01/05/19 1843    Chesley Noon, MD 01/05/19 [id number]

## 2019-01-05 NOTE — ED Notes (Signed)
Pt's daughter verbalized understanding of discharge instructions. Pt is in NAD at this time.

## 2019-01-05 NOTE — ED Triage Notes (Signed)
Pt to ED via EMS from [facility] to R/O a stroke. Staff reporting pt usually ambulates to dining hall for breakfast and feeds self but today she did not go to dining hall and was holding her right hand appropriately. Staff reporting it appeared pt was clenching right.   Per staff pts baseline is alert and oriented with some episodes of confusion intermittently but pt is usually able to carry a conversation. Pt able to talk to staff appropriately upon arrival to ED and states, "I feel fine, I don't know why I am here." When pt is updated pt stated, "I was just not hungry."   Pt oriented to self and able to tell RN she is in a hospital but is unable to name the hospital correctly. Pt disoriented to time and situation.

## 2019-01-20 ENCOUNTER — Emergency Department: Payer: Medicare Other

## 2019-01-20 ENCOUNTER — Emergency Department
Admission: EM | Admit: 2019-01-20 | Discharge: 2019-01-20 | Disposition: A | Discharge status: Skilled Nursing Facility | Payer: Medicare Other | Attending: Emergency Medicine | Admitting: Emergency Medicine

## 2019-01-20 ENCOUNTER — Other Ambulatory Visit: Payer: Self-pay

## 2019-01-20 DIAGNOSIS — M25531 Pain in right wrist: Secondary | ICD-10-CM | POA: Diagnosis not present

## 2019-01-20 DIAGNOSIS — Y929 Unspecified place or not applicable: Secondary | ICD-10-CM | POA: Diagnosis not present

## 2019-01-20 DIAGNOSIS — Z79899 Other long term (current) drug therapy: Secondary | ICD-10-CM | POA: Insufficient documentation

## 2019-01-20 DIAGNOSIS — I509 Heart failure, unspecified: Secondary | ICD-10-CM | POA: Diagnosis not present

## 2019-01-20 DIAGNOSIS — E119 Type 2 diabetes mellitus without complications: Secondary | ICD-10-CM | POA: Insufficient documentation

## 2019-01-20 DIAGNOSIS — Z7901 Long term (current) use of anticoagulants: Secondary | ICD-10-CM | POA: Diagnosis not present

## 2019-01-20 DIAGNOSIS — S0083XA Contusion of other part of head, initial encounter: Secondary | ICD-10-CM | POA: Insufficient documentation

## 2019-01-20 DIAGNOSIS — Z87891 Personal history of nicotine dependence: Secondary | ICD-10-CM | POA: Diagnosis not present

## 2019-01-20 DIAGNOSIS — Z23 Encounter for immunization: Secondary | ICD-10-CM | POA: Diagnosis not present

## 2019-01-20 DIAGNOSIS — W19XXXA Unspecified fall, initial encounter: Secondary | ICD-10-CM | POA: Diagnosis not present

## 2019-01-20 DIAGNOSIS — M25551 Pain in right hip: Secondary | ICD-10-CM | POA: Insufficient documentation

## 2019-01-20 DIAGNOSIS — Y999 Unspecified external cause status: Secondary | ICD-10-CM | POA: Insufficient documentation

## 2019-01-20 DIAGNOSIS — Y939 Activity, unspecified: Secondary | ICD-10-CM | POA: Diagnosis not present

## 2019-01-20 DIAGNOSIS — S0990XA Unspecified injury of head, initial encounter: Secondary | ICD-10-CM | POA: Diagnosis present

## 2019-01-20 DIAGNOSIS — F0390 Unspecified dementia without behavioral disturbance: Secondary | ICD-10-CM | POA: Insufficient documentation

## 2019-01-20 LAB — CBC WITH DIFFERENTIAL/PLATELET
Abs Immature Granulocytes: 0.03 10*3/uL (ref 0.00–0.07)
Basophils Absolute: 0 10*3/uL (ref 0.0–0.1)
Basophils Relative: 1 %
Eosinophils Absolute: 0.1 10*3/uL (ref 0.0–0.5)
Eosinophils Relative: 1 %
HCT: 34.6 % — ABNORMAL LOW (ref 36.0–46.0)
Hemoglobin: 11.2 g/dL — ABNORMAL LOW (ref 12.0–15.0)
Immature Granulocytes: 1 %
Lymphocytes Relative: 23 %
Lymphs Abs: 1.3 10*3/uL (ref 0.7–4.0)
MCH: 28.9 pg (ref 26.0–34.0)
MCHC: 32.4 g/dL (ref 30.0–36.0)
MCV: 89.2 fL (ref 80.0–100.0)
Monocytes Absolute: 0.6 10*3/uL (ref 0.1–1.0)
Monocytes Relative: 11 %
Neutro Abs: 3.7 10*3/uL (ref 1.7–7.7)
Neutrophils Relative %: 63 %
Platelets: 242 10*3/uL (ref 150–400)
RBC: 3.88 MIL/uL (ref 3.87–5.11)
RDW: 13.2 % (ref 11.5–15.5)
WBC: 5.7 10*3/uL (ref 4.0–10.5)
nRBC: 0 % (ref 0.0–0.2)

## 2019-01-20 LAB — BASIC METABOLIC PANEL
Anion gap: 11 (ref 5–15)
BUN: 19 mg/dL (ref 8–23)
CO2: 31 mmol/L (ref 22–32)
Calcium: 9.5 mg/dL (ref 8.9–10.3)
Chloride: 100 mmol/L (ref 98–111)
Creatinine, Ser: 1.09 mg/dL — ABNORMAL HIGH (ref 0.44–1.00)
GFR calc Af Amer: 54 mL/min — ABNORMAL LOW (ref >60)
GFR calc non Af Amer: 47 mL/min — ABNORMAL LOW (ref >60)
Glucose, Bld: 128 mg/dL — ABNORMAL HIGH (ref 70–99)
Potassium: 3.6 mmol/L (ref 3.5–5.1)
Sodium: 142 mmol/L (ref 135–145)

## 2019-01-20 LAB — PROTIME-INR
INR: 1.4 — ABNORMAL HIGH (ref 0.8–1.2)
Prothrombin Time: 17.1 seconds — ABNORMAL HIGH (ref 11.4–15.2)

## 2019-01-20 LAB — APTT: aPTT: 34 seconds (ref 24–36)

## 2019-01-20 MED ORDER — TETANUS-DIPHTH-ACELL PERTUSSIS 5-2.5-18.5 LF-MCG/0.5 IM SUSP
0.5000 mL | Freq: Once | INTRAMUSCULAR | Status: AC
  Administered 2019-01-20: 0.5 mL via INTRAMUSCULAR
  Filled 2019-01-20: qty 0.5

## 2019-01-20 MED ORDER — ACETAMINOPHEN 325 MG PO TABS
650.0000 mg | ORAL_TABLET | Freq: Once | ORAL | Status: AC
  Administered 2019-01-20: 650 mg via ORAL
  Filled 2019-01-20: qty 2

## 2019-01-20 NOTE — ED Notes (Signed)
Pt in scans, will administer meds and draw blood when pt returns.

## 2019-01-20 NOTE — ED Notes (Signed)
Pt daughter called and was informed that pt was having some tests done. Daughter stated she is out of town and won't be coming to ED but another family member was on their way.

## 2019-01-20 NOTE — ED Notes (Signed)
Pt's daughter, Gabrielle Dare, signed paper copy of discharge paperwork due to patient's advanced dementia.

## 2019-01-20 NOTE — ED Triage Notes (Addendum)
Pt was found on the floor by staff at [facility] screaming, pt reported that she was attempting to get out of bed and fell, pt has a hx of dementia, pt has swelling noted to her forehead on the left side. Pt is also reported to be on eliquis. Pt is currently awake and yelling that she is cold.  Ems reports 147/68, 67 heart rate, and 97% on RA  CBG of 146

## 2019-01-20 NOTE — ED Provider Notes (Addendum)
[facility] Emergency Department Provider Note  ____________________________________________   First MD Initiated Contact with Patient 01/20/19 1247     (approximate)  I have reviewed the triage vital signs and the nursing notes.   HISTORY  Chief Complaint Fall, Head Injury, and Anticoagulation    HPI Jasmine Hubbard is a 85 y.o. female with dementia, PE on Eliquis who resides at [facility] who presents with fall.  Patient rolled out of her bed just prior to arrival.  Patient heard her screaming and noted a hematoma to the front of her head.  Patient is on a blood thinner.  Patient does have a headache that is moderate, constant, nothing makes it better, nothing makes it worse.  Per EMS patient was able to ambulate with assistance.          Past Medical History:  Diagnosis Date   CHF (congestive heart failure) (HCC)    Diabetes mellitus without complication (HCC)    Left ventricular failure (HCC)    Osteoporosis    Pulmonary embolism (HCC)    Pulmonary embolus (HCC)    Vascular dementia (HCC)     There are no active problems to display for this patient.   Past Surgical History:  Procedure Laterality Date   ABDOMINAL HYSTERECTOMY     BACK SURGERY     FRACTURE SURGERY     elbow    Prior to Admission medications   Medication Sig Start Date End Date Taking? Authorizing Provider  acetaminophen (TYLENOL) 325 MG tablet Take 650 mg by mouth every 6 (six) hours as needed for mild pain.    [provider]  alendronate (FOSAMAX) 70 MG tablet Take 70 mg by mouth every Friday. Take with a full glass of water on an empty stomach.     [provider]  alum & mag hydroxide-simeth (MINTOX) 200-200-20 MG/5ML suspension Take 30 mLs by mouth every 6 (six) hours as needed for indigestion or heartburn.    [provider]  apixaban (ELIQUIS) 2.5 MG TABS tablet Take 2.5 mg by mouth 2 (two) times daily.      [provider]  Calcium Carb-Cholecalciferol 600-400 MG-UNIT TABS Take 1 tablet by mouth 2 (two) times a day.     [provider]  Calcium Citrate-Vitamin D 250-200 MG-UNIT TABS Take 2 tablets by mouth at bedtime.     [provider]  citalopram (CELEXA) 10 MG tablet Take 10 mg by mouth daily.    [provider]  furosemide (LASIX) 20 MG tablet Take 20 mg by mouth daily.     [provider]  guaifenesin (ROBITUSSIN) 100 MG/5ML syrup Take 200 mg by mouth every 6 (six) hours as needed for cough or congestion. (max 4 doses daily)    [provider]  hydrOXYzine (ATARAX/VISTARIL) 25 MG tablet Take 25 mg by mouth 2 (two) times daily as needed for anxiety.    [provider]  loperamide (IMODIUM) 2 MG capsule Take 2 mg by mouth as needed for diarrhea or loose stools. (max 8 doses in 24 hours)    [provider]  lovastatin (MEVACOR) 20 MG tablet Take 40 mg by mouth at bedtime.    [provider]  magnesium hydroxide (MILK OF MAGNESIA) 400 MG/5ML suspension Take 30 mLs by mouth daily as needed for mild constipation.    [provider]  metFORMIN (GLUCOPHAGE) 500 MG tablet Take 500 mg by mouth 2 (two) times daily with a meal.  [provider]  neomycin-bacitracin-polymyxin (NEOSPORIN) OINT Apply 1 application topically 4 (four) times daily as needed for wound care.    [provider]  sodium chloride (OCEAN) 0.65 % SOLN nasal spray Place 1 spray into both nostrils 3 (three) times daily.     [provider]    Allergies Naproxen  No family history on file.  Social History Social History   Tobacco Use   Smoking status: Former Smoker    Types: Cigarettes    Quit date: 08/28/1999    Years since quitting: 19.4   Smokeless tobacco: Never Used  Substance Use Topics   Alcohol use: Not Currently    Frequency: Never   Drug use: Never      Review of Systems Constitutional: No  fever/chills Eyes: No visual changes. ENT: No sore throat. Cardiovascular: Denies chest pain. Respiratory: Denies shortness of breath. Gastrointestinal: No abdominal pain.  No nausea, no vomiting.  No diarrhea.  No constipation. Genitourinary: Negative for dysuria. Musculoskeletal: Negative for back pain.  Positive right hip pain and right wrist pain Skin: Negative for rash. Neurological: Positive headache no focal weakness or numbness. All other ROS negative ____________________________________________   PHYSICAL EXAM:  VITAL SIGNS: Blood pressure 138/72, pulse 61, temperature (!) 96.8 F (36 C), temperature source Axillary, resp. rate 16, height 5\' 5"  (1.651 m), weight 59 kg, SpO2 95 %.   Constitutional: Alert. Well appearing and in no acute distress. Eyes: Conjunctivae are normal. EOMI. pupils equal and reactive Head: hematoma on forehead  Nose: No congestion/rhinnorhea. Mouth/Throat: Mucous membranes are moist.   Neck: No stridor. Trachea Midline. FROM Cardiovascular: Normal rate, regular rhythm. Grossly normal heart sounds.  Good peripheral circulation. Respiratory: Normal respiratory effort.  No retractions. Lungs CTAB. Gastrointestinal: Soft and nontender. No distention. No abdominal bruits.  Musculoskeletal: No lower extremity tenderness nor edema.  No joint effusions.  Maybe some tenderness on her right wrist and right hip pain with lifting up her right leg. Neurologic:  Normal speech and language. No gross focal neurologic deficits are appreciated.  Skin:  Skin is warm, dry and intact. No rash noted. Psychiatric: Mood and affect are normal. Speech and behavior are normal. GU: Deferred   ____________________________________________   LABS (all labs ordered are listed, but only abnormal results are displayed)  Labs Reviewed  CBC WITH DIFFERENTIAL/PLATELET - Abnormal; Notable for the following components:      Result Value   Hemoglobin 11.2 (*)    HCT 34.6 (*)     All other components within normal limits  BASIC METABOLIC PANEL - Abnormal; Notable for the following components:   Glucose, Bld 128 (*)    Creatinine, Ser 1.09 (*)    GFR calc non Af Amer 47 (*)    GFR calc Af Amer 54 (*)    All other components within normal limits  PROTIME-INR - Abnormal; Notable for the following components:   Prothrombin Time 17.1 (*)    INR 1.4 (*)    All other components within normal limits  APTT   ____________________________________________   ED ECG REPORT I, Concha SeMary E Yaslyn Cumby, the attending physician, personally viewed and interpreted this ECG.  Normal sinus rate of 61, no ST elevation, no T wave inversion, normal intervals ____________________________________________  RADIOLOGY Vela ProseI, Brahm Barbeau E Ruthel Martine, personally viewed and evaluated these images (plain radiographs) as part of my medical decision making, as well as reviewing the written report by the radiologist.  ED MD interpretation: X-rays are negative.  Official radiology report(s): Dg Wrist  Complete Right  Result Date: 01/20/2019 CLINICAL DATA:  Fall, right wrist pain EXAM: RIGHT WRIST - COMPLETE 3+ VIEW COMPARISON:  None. FINDINGS: No fracture. No dislocation. No suspicious focal osseous lesion. Mild first carpometacarpal joint osteoarthritis. No radiopaque foreign body. IMPRESSION: No fracture or malalignment in the right wrist. Mild first carpometacarpal joint osteoarthritis. Electronically Signed   By: Delbert Phenix M.D.   On: 01/20/2019 14:01   Ct Head Wo Contrast  Result Date: 01/20/2019 CLINICAL DATA:  Moderate head trauma status post fall. EXAM: CT HEAD WITHOUT CONTRAST CT MAXILLOFACIAL WITHOUT CONTRAST CT CERVICAL SPINE WITHOUT CONTRAST TECHNIQUE: Multidetector CT imaging of the head, cervical spine, and maxillofacial structures were performed using the standard protocol without intravenous contrast. Multiplanar CT image reconstructions of the cervical spine and maxillofacial structures were also  generated. COMPARISON:  12/19/2018, 01/05/2019 FINDINGS: CT HEAD FINDINGS Brain: No evidence of acute infarction, hemorrhage, extra-axial collection, ventriculomegaly, or mass effect. Generalized cerebral atrophy. Periventricular white matter low attenuation likely secondary to microangiopathy. Vascular: Cerebrovascular atherosclerotic calcifications are noted. Skull: Negative for fracture or focal lesion. Sinuses/Orbits: Visualized portions of the orbits are unremarkable. Visualized portions of the paranasal sinuses and mastoid air cells are unremarkable. Other: Large left frontal scalp hematoma. CT MAXILLOFACIAL FINDINGS Osseous: No fracture or mandibular dislocation. No destructive process. Orbits: Negative. No traumatic or inflammatory finding. Sinuses: No paranasal sinus air-fluid level. Mastoid sinuses are clear. Mild right frontal maxillary sinus mucosal thickening. Soft tissues:  Left frontal scalp hematoma. CT CERVICAL SPINE FINDINGS Alignment: Normal. Skull base and vertebrae: Mild T1 vertebral body compression fracture new compared with 10/19/2018 with less than 10% height loss. No primary bone lesion or focal pathologic process. Soft tissues and spinal canal: No prevertebral fluid or swelling. No visible canal hematoma. Disc levels: Degenerative disease with disc height loss at C6-7. Bilateral facet arthropathy at C7-T1 and T1-2. Moderate left facet arthropathy at C3-4. Upper chest: Lung apices are clear. Other: Thoracic aortic atherosclerosis. IMPRESSION: 1. No acute intracranial pathology. 2. Large left frontal scalp hematoma. 3. Acute mild T1 vertebral body compression fracture new compared with 10/19/2018 with less than 10% height loss. 4.  No acute osseous injury of the maxillofacial bones. Electronically Signed   By: Elige Ko   On: 01/20/2019 14:00   Ct Cervical Spine Wo Contrast  Result Date: 01/20/2019 CLINICAL DATA:  Moderate head trauma status post fall. EXAM: CT HEAD WITHOUT CONTRAST  CT MAXILLOFACIAL WITHOUT CONTRAST CT CERVICAL SPINE WITHOUT CONTRAST TECHNIQUE: Multidetector CT imaging of the head, cervical spine, and maxillofacial structures were performed using the standard protocol without intravenous contrast. Multiplanar CT image reconstructions of the cervical spine and maxillofacial structures were also generated. COMPARISON:  12/19/2018, 01/05/2019 FINDINGS: CT HEAD FINDINGS Brain: No evidence of acute infarction, hemorrhage, extra-axial collection, ventriculomegaly, or mass effect. Generalized cerebral atrophy. Periventricular white matter low attenuation likely secondary to microangiopathy. Vascular: Cerebrovascular atherosclerotic calcifications are noted. Skull: Negative for fracture or focal lesion. Sinuses/Orbits: Visualized portions of the orbits are unremarkable. Visualized portions of the paranasal sinuses and mastoid air cells are unremarkable. Other: Large left frontal scalp hematoma. CT MAXILLOFACIAL FINDINGS Osseous: No fracture or mandibular dislocation. No destructive process. Orbits: Negative. No traumatic or inflammatory finding. Sinuses: No paranasal sinus air-fluid level. Mastoid sinuses are clear. Mild right frontal maxillary sinus mucosal thickening. Soft tissues:  Left frontal scalp hematoma. CT CERVICAL SPINE FINDINGS Alignment: Normal. Skull base and vertebrae: Mild T1 vertebral body compression fracture new compared with 10/19/2018 with less than 10% height loss. No  primary bone lesion or focal pathologic process. Soft tissues and spinal canal: No prevertebral fluid or swelling. No visible canal hematoma. Disc levels: Degenerative disease with disc height loss at C6-7. Bilateral facet arthropathy at C7-T1 and T1-2. Moderate left facet arthropathy at C3-4. Upper chest: Lung apices are clear. Other: Thoracic aortic atherosclerosis. IMPRESSION: 1. No acute intracranial pathology. 2. Large left frontal scalp hematoma. 3. Acute mild T1 vertebral body compression  fracture new compared with 10/19/2018 with less than 10% height loss. 4.  No acute osseous injury of the maxillofacial bones. Electronically Signed   By: Kathreen Devoid   On: 01/20/2019 14:00   Dg Chest Portable 1 View  Result Date: 01/20/2019 CLINICAL DATA:  Fall EXAM: PORTABLE CHEST 1 VIEW COMPARISON:  01/05/2019 chest radiograph. FINDINGS: Stable cardiomediastinal silhouette with normal heart size. No pneumothorax. No pleural effusion. Lungs appear clear, with no acute consolidative airspace disease and no pulmonary edema. No displaced fractures in the visualized chest. IMPRESSION: No active disease. Electronically Signed   By: Ilona Sorrel M.D.   On: 01/20/2019 14:00   Dg Hip Unilat W Or Wo Pelvis 2-3 Views Right  Result Date: 01/20/2019 CLINICAL DATA:  Fall, right hip pain EXAM: DG HIP (WITH OR WITHOUT PELVIS) 2-3V RIGHT COMPARISON:  Pelvic and right hip radiographs from 12/01/2017. FINDINGS: No pelvic fracture or diastasis. No right hip fracture or dislocation. No suspicious focal osseous lesion. Minimal degenerative changes in the weight-bearing portions of both hip joints. IMPRESSION: No fracture.  No right hip malalignment. Electronically Signed   By: Ilona Sorrel M.D.   On: 01/20/2019 14:00   Ct Maxillofacial Wo Contrast  Result Date: 01/20/2019 CLINICAL DATA:  Moderate head trauma status post fall. EXAM: CT HEAD WITHOUT CONTRAST CT MAXILLOFACIAL WITHOUT CONTRAST CT CERVICAL SPINE WITHOUT CONTRAST TECHNIQUE: Multidetector CT imaging of the head, cervical spine, and maxillofacial structures were performed using the standard protocol without intravenous contrast. Multiplanar CT image reconstructions of the cervical spine and maxillofacial structures were also generated. COMPARISON:  12/19/2018, 01/05/2019 FINDINGS: CT HEAD FINDINGS Brain: No evidence of acute infarction, hemorrhage, extra-axial collection, ventriculomegaly, or mass effect. Generalized cerebral atrophy. Periventricular white  matter low attenuation likely secondary to microangiopathy. Vascular: Cerebrovascular atherosclerotic calcifications are noted. Skull: Negative for fracture or focal lesion. Sinuses/Orbits: Visualized portions of the orbits are unremarkable. Visualized portions of the paranasal sinuses and mastoid air cells are unremarkable. Other: Large left frontal scalp hematoma. CT MAXILLOFACIAL FINDINGS Osseous: No fracture or mandibular dislocation. No destructive process. Orbits: Negative. No traumatic or inflammatory finding. Sinuses: No paranasal sinus air-fluid level. Mastoid sinuses are clear. Mild right frontal maxillary sinus mucosal thickening. Soft tissues:  Left frontal scalp hematoma. CT CERVICAL SPINE FINDINGS Alignment: Normal. Skull base and vertebrae: Mild T1 vertebral body compression fracture new compared with 10/19/2018 with less than 10% height loss. No primary bone lesion or focal pathologic process. Soft tissues and spinal canal: No prevertebral fluid or swelling. No visible canal hematoma. Disc levels: Degenerative disease with disc height loss at C6-7. Bilateral facet arthropathy at C7-T1 and T1-2. Moderate left facet arthropathy at C3-4. Upper chest: Lung apices are clear. Other: Thoracic aortic atherosclerosis. IMPRESSION: 1. No acute intracranial pathology. 2. Large left frontal scalp hematoma. 3. Acute mild T1 vertebral body compression fracture new compared with 10/19/2018 with less than 10% height loss. 4.  No acute osseous injury of the maxillofacial bones. Electronically Signed   By: Kathreen Devoid   On: 01/20/2019 14:00    ____________________________________________   PROCEDURES  Procedure(s) performed (including Critical Care):  Procedures   ____________________________________________   INITIAL IMPRESSION / ASSESSMENT AND PLAN / ED COURSE  Jasmine Hubbard was evaluated in Emergency Department on 01/20/2019 for the symptoms described in the history of present illness.  She was evaluated in the context of the global COVID-19 pandemic, which necessitated consideration that the patient might be at risk for infection with the SARS-CoV-2 virus that causes COVID-19. Institutional protocols and algorithms that pertain to the evaluation of patients at risk for COVID-19 are in a state of rapid change based on information released by regulatory bodies including the CDC and federal and state organizations. These policies and algorithms were followed during the patient's care in the ED.     Patient presents after a fall.  Will get CT head to evaluate for intracranial hemorrhage.  CT cervical to evaluate for cervical fracture.  X-ray to evaluate for rib fractures.  X-ray to evaluate for pelvic fractures.  Patient denied any pain in her hip but when she tried to lift up her right leg there was a little bit of pain.  No obvious deformity to suggest fracture but will get x-ray to confirm.  Unclear if she is having pain in her right wrist due to baseline dementia.  No obvious deformity.  No snuffbox tenderness.  But will get x-ray to ensure no fracture.   CT head was negative other than the large scalp hematoma.  Patient does have a mild T1 vertebral body compression fracture.  Patient does not have any tenderness on this area so I suspect that it is old.  hemoGlobin at baseline.  Kidney function at baseline.  Patient able to ambulate therefore low suspicion for occult hip fracture.     ____________________________________________   FINAL CLINICAL IMPRESSION(S) / ED DIAGNOSES   Final diagnoses:  Fall, initial encounter  Traumatic hematoma of forehead, initial encounter      MEDICATIONS GIVEN DURING THIS VISIT:  Medications  Tdap (BOOSTRIX) injection 0.5 mL (0.5 mLs Intramuscular Given 01/20/19 1350)  acetaminophen (TYLENOL) tablet 650 mg (650 mg Oral Given 01/20/19 1350)     ED Discharge Orders    None       Note:  This document was prepared using Dragon  voice recognition software and may include unintentional dictation errors.   Concha Se, MD 01/20/19 1446    Concha Se, MD 01/20/19 [phone]

## 2019-01-20 NOTE — ED Notes (Signed)
Pt ambulated well in hallway with walker with staff members. Daughter taking pt back to facility in POV at this time.

## 2019-01-20 NOTE — Discharge Instructions (Addendum)
CT scans were negative except concern for possible T1 fracture but patient was not having pain on this area.  IMPRESSION: 1. No acute intracranial pathology. 2. Large left frontal scalp hematoma. 3. Acute mild T1 vertebral body compression fracture new compared with 10/19/2018 with less than 10% height loss. 4.  No acute osseous injury of the maxillofacial bones.  X-rays were negative.  Patient was able to ambulate.  Return to the ER for worsening confusion or any other concerns.

## 2019-02-10 ENCOUNTER — Emergency Department: Payer: Medicare Other

## 2019-02-10 ENCOUNTER — Other Ambulatory Visit: Payer: Self-pay

## 2019-02-10 ENCOUNTER — Emergency Department
Admission: EM | Admit: 2019-02-10 | Discharge: 2019-02-10 | Disposition: A | Discharge status: Home / Self Care | Payer: Medicare Other | Attending: Emergency Medicine | Admitting: Emergency Medicine

## 2019-02-10 DIAGNOSIS — Z87891 Personal history of nicotine dependence: Secondary | ICD-10-CM | POA: Insufficient documentation

## 2019-02-10 DIAGNOSIS — E119 Type 2 diabetes mellitus without complications: Secondary | ICD-10-CM | POA: Diagnosis not present

## 2019-02-10 DIAGNOSIS — F0390 Unspecified dementia without behavioral disturbance: Secondary | ICD-10-CM | POA: Diagnosis not present

## 2019-02-10 DIAGNOSIS — Z79899 Other long term (current) drug therapy: Secondary | ICD-10-CM | POA: Insufficient documentation

## 2019-02-10 DIAGNOSIS — Z7901 Long term (current) use of anticoagulants: Secondary | ICD-10-CM | POA: Insufficient documentation

## 2019-02-10 DIAGNOSIS — R0602 Shortness of breath: Secondary | ICD-10-CM | POA: Insufficient documentation

## 2019-02-10 DIAGNOSIS — Z7984 Long term (current) use of oral hypoglycemic drugs: Secondary | ICD-10-CM | POA: Insufficient documentation

## 2019-02-10 NOTE — ED Notes (Signed)
Pt Osat during ambulation = 93%

## 2019-02-10 NOTE — Discharge Instructions (Addendum)
Patient with normal x-ray here, ambulating well without any hypoxia.  Normal work of breathing, normal vitals.  Reassuring work-up appropriate for outpatient follow-up

## 2019-02-10 NOTE — ED Provider Notes (Signed)
[facility] Emergency Department Provider Note   ____________________________________________    I have reviewed the triage vital signs and the nursing notes.   HISTORY  Chief Complaint Shortness of Breath  History limited by dementia   HPI Jasmine Hubbard is a 85 y.o. female with history of CHF, diabetes, PE, anticoagulated and dementia.  Sent for evaluation for possible low oxygen saturation.  Patient has no complaints but history is deafly limited by dementia.  EMS reports when they arrived room air saturation was 96%.  No reports of fevers or chills or cough  Past Medical History:  Diagnosis Date  . CHF (congestive heart failure) (HCC)   . Diabetes mellitus without complication (HCC)   . Left ventricular failure (HCC)   . Osteoporosis   . Pulmonary embolism (HCC)   . Pulmonary embolus (HCC)   . Vascular dementia (HCC)     There are no active problems to display for this patient.   Past Surgical History:  Procedure Laterality Date  . ABDOMINAL HYSTERECTOMY    . BACK SURGERY    . FRACTURE SURGERY     elbow    Prior to Admission medications   Medication Sig Start Date End Date Taking? Authorizing Provider  acetaminophen (TYLENOL) 325 MG tablet Take 650 mg by mouth every 6 (six) hours as needed for mild pain.    [provider]  alendronate (FOSAMAX) 70 MG tablet Take 70 mg by mouth every Friday. Take with a full glass of water on an empty stomach.     [provider]  alum & mag hydroxide-simeth (MINTOX) 200-200-20 MG/5ML suspension Take 30 mLs by mouth every 6 (six) hours as needed for indigestion or heartburn.    [provider]  apixaban (ELIQUIS) 2.5 MG TABS tablet Take 2.5 mg by mouth 2 (two) times daily.     [provider]  Calcium Carb-Cholecalciferol 600-400 MG-UNIT TABS Take 1 tablet by mouth 2 (two) times a day.     [provider]  Calcium Citrate-Vitamin D 250-200 MG-UNIT  TABS Take 2 tablets by mouth at bedtime.     [provider]  citalopram (CELEXA) 10 MG tablet Take 10 mg by mouth daily.    [provider]  furosemide (LASIX) 20 MG tablet Take 20 mg by mouth daily.     [provider]  guaifenesin (ROBITUSSIN) 100 MG/5ML syrup Take 200 mg by mouth every 6 (six) hours as needed for cough or congestion. (max 4 doses daily)    [provider]  hydrOXYzine (ATARAX/VISTARIL) 25 MG tablet Take 25 mg by mouth 2 (two) times daily as needed for anxiety.    [provider]  loperamide (IMODIUM) 2 MG capsule Take 2 mg by mouth as needed for diarrhea or loose stools. (max 8 doses in 24 hours)    [provider]  lovastatin (MEVACOR) 20 MG tablet Take 40 mg by mouth at bedtime.    [provider]  magnesium hydroxide (MILK OF MAGNESIA) 400 MG/5ML suspension Take 30 mLs by mouth daily as needed for mild constipation.    [provider]  metFORMIN (GLUCOPHAGE) 500 MG tablet Take 500 mg by mouth 2 (two) times daily with a meal.    [provider]  neomycin-bacitracin-polymyxin (NEOSPORIN) OINT Apply 1 application topically 4 (four) times daily as needed for wound care.    [provider]  sodium chloride (OCEAN) 0.65 % SOLN nasal spray Place 1 spray into both  nostrils 3 (three) times daily.     [provider]     Allergies Naproxen  No family history on file.  Social History Social History   Tobacco Use  . Smoking status: Former Smoker    Types: Cigarettes    Quit date: 08/28/1999    Years since quitting: 19.4  . Smokeless tobacco: Never Used  Substance Use Topics  . Alcohol use: Not Currently    Frequency: Never  . Drug use: Never    Review of Systems unable to obtain due to dementia     ____________________________________________   PHYSICAL EXAM:  VITAL SIGNS: ED Triage Vitals  Enc Vitals Group     BP 02/10/19 1638 110/79     Pulse Rate 02/10/19 1638  83     Resp 02/10/19 1638 15     Temp 02/10/19 1638 97.6 F (36.4 C)     Temp Source 02/10/19 1638 Oral     SpO2 02/10/19 1638 96 %     Weight 02/10/19 1639 63.5 kg (140 lb)     Height 02/10/19 1639 1.727 m (5\' 8" )     Head Circumference --      Peak Flow --      Pain Score 02/10/19 1638 0     Pain Loc --      Pain Edu? --      Excl. in [place]? --     Constitutional: Alert No acute distress  Nose: No congestion/rhinnorhea. Mouth/Throat: Mucous membranes are moist.   Neck:  Painless ROM Cardiovascular: Normal rate, regular rhythm. Grossly normal heart sounds.  Good peripheral circulation. Respiratory: Normal respiratory effort.  No retractions. Lungs CTAB.  Patient without increased work of breathing, quite comfortable Gastrointestinal: Soft and nontender. No distention.   Musculoskeletal: No lower extremity tenderness nor edema.  Warm and well perfused Neurologic:   No gross focal neurologic deficits are appreciated.  Skin:  Skin is warm, dry and intact. No rash noted.   ____________________________________________   LABS (all labs ordered are listed, but only abnormal results are displayed)  Labs Reviewed - No data to display ____________________________________________  EKG  None ____________________________________________  RADIOLOGY  Chest x-ray unremarkable ____________________________________________   PROCEDURES  Procedure(s) performed: No  Procedures   Critical Care performed: No ____________________________________________   INITIAL IMPRESSION / ASSESSMENT AND PLAN / ED COURSE  Pertinent labs & imaging results that were available during my care of the patient were reviewed by me and considered in my medical decision making (see chart for details).  Patient well-appearing and in no acute distress, oxygen saturations are normal here.  Absolutely no increased work of breathing, clear to auscultation bilaterally.  We will check chest x-ray and  ambulatory pulse ox  Chest x-ray and ambulatory pulse ox are both reassuring, patient is appropriate for discharge with outpatient follow-up as needed    ____________________________________________   FINAL CLINICAL IMPRESSION(S) / ED DIAGNOSES  Final diagnoses:  Shortness of breath        Note:  This document was prepared using Dragon voice recognition software and may include unintentional dictation errors.   Lavonia Drafts, MD 02/10/19 [phone]

## 2019-02-10 NOTE — ED Notes (Signed)
Pt daughter at bedside

## 2019-02-10 NOTE — ED Triage Notes (Signed)
Pt arrives via EMS from [place] d/t low O2 in the 70s- per EMS pt was 96% on room air- pt in NAD, respirations even and unlaboured- pt denies any complaints

## 2019-03-30 ENCOUNTER — Other Ambulatory Visit: Payer: Self-pay

## 2019-03-30 ENCOUNTER — Emergency Department: Payer: Medicare PPO

## 2019-03-30 ENCOUNTER — Emergency Department
Admission: EM | Admit: 2019-03-30 | Discharge: 2019-03-30 | Disposition: A | Discharge status: Home / Self Care | Payer: Medicare PPO | Attending: Emergency Medicine | Admitting: Emergency Medicine

## 2019-03-30 DIAGNOSIS — W06XXXA Fall from bed, initial encounter: Secondary | ICD-10-CM | POA: Insufficient documentation

## 2019-03-30 DIAGNOSIS — I509 Heart failure, unspecified: Secondary | ICD-10-CM | POA: Diagnosis not present

## 2019-03-30 DIAGNOSIS — Y999 Unspecified external cause status: Secondary | ICD-10-CM | POA: Insufficient documentation

## 2019-03-30 DIAGNOSIS — S0101XA Laceration without foreign body of scalp, initial encounter: Secondary | ICD-10-CM | POA: Diagnosis present

## 2019-03-30 DIAGNOSIS — Y939 Activity, unspecified: Secondary | ICD-10-CM | POA: Diagnosis not present

## 2019-03-30 DIAGNOSIS — S0990XA Unspecified injury of head, initial encounter: Secondary | ICD-10-CM | POA: Diagnosis not present

## 2019-03-30 DIAGNOSIS — S0181XA Laceration without foreign body of other part of head, initial encounter: Secondary | ICD-10-CM

## 2019-03-30 DIAGNOSIS — Y92122 Bedroom in nursing home as the place of occurrence of the external cause: Secondary | ICD-10-CM | POA: Diagnosis not present

## 2019-03-30 DIAGNOSIS — Z87891 Personal history of nicotine dependence: Secondary | ICD-10-CM | POA: Diagnosis not present

## 2019-03-30 DIAGNOSIS — E119 Type 2 diabetes mellitus without complications: Secondary | ICD-10-CM | POA: Insufficient documentation

## 2019-03-30 DIAGNOSIS — W19XXXA Unspecified fall, initial encounter: Secondary | ICD-10-CM

## 2019-03-30 NOTE — ED Notes (Signed)
Pt transported to CT ?

## 2019-03-30 NOTE — ED Triage Notes (Signed)
Pt arrives via EMS from [facility] after falling out of bed and hitting her head on there nightstand- per EMS this was an unwitnessed fall- pt denies any pain- pt is not on blood thinners- VSS per EMS-

## 2019-03-30 NOTE — ED Provider Notes (Signed)
[facility] Emergency Department Provider Note       Time seen: ----------------------------------------- 2:13 PM on 03/30/2019 -----------------------------------------   I have reviewed the triage vital signs and the nursing notes.  HISTORY   Chief Complaint Fall and Laceration    HPI Torah Pinnock is a 86 y.o. female with a history of CHF, diabetes, PE, vascular dementia who presents to the ED for a fall.  Patient arrives by EMS from [facility] after falling out of her bed and hitting her head on the nightstand.  This was an unwitnessed fall.  She denies any pain or other complaints.  She did sustain a laceration to the frontal scalp.  Past Medical History:  Diagnosis Date  . CHF (congestive heart failure) ([place])   . Diabetes mellitus without complication ([place])   . Left ventricular failure ([place])   . Osteoporosis   . Pulmonary embolism ([place])   . Pulmonary embolus ([place])   . Vascular dementia ([place])     There are no problems to display for this patient.   Past Surgical History:  Procedure Laterality Date  . ABDOMINAL HYSTERECTOMY    . BACK SURGERY    . FRACTURE SURGERY     elbow    Allergies Naproxen  Social History Social History   Tobacco Use  . Smoking status: Former Smoker    Types: Cigarettes    Quit date: 08/28/1999    Years since quitting: 19.6  . Smokeless tobacco: Never Used  Substance Use Topics  . Alcohol use: Not Currently  . Drug use: Never    Review of Systems Constitutional: Negative for fever. Cardiovascular: Negative for chest pain. Respiratory: Negative for shortness of breath. Gastrointestinal: Negative for abdominal pain, vomiting and diarrhea. Musculoskeletal: Negative for back pain. Skin: Positive for frontal scalp laceration Neurological: Negative for headaches, focal weakness or numbness.  All systems negative/normal/unremarkable except as stated in the  HPI  ____________________________________________   PHYSICAL EXAM:  VITAL SIGNS: ED Triage Vitals  Enc Vitals Group     BP 03/30/19 1353 (!) 97/56     Pulse Rate 03/30/19 1353 66     Resp 03/30/19 1353 16     Temp 03/30/19 1353 98.3 F (36.8 C)     Temp Source 03/30/19 1353 Oral     SpO2 03/30/19 1353 95 %     Weight 03/30/19 1357 150 lb (68 kg)     Height 03/30/19 1357 5\' 4"  (1.626 m)     Head Circumference --      Peak Flow --      Pain Score 03/30/19 1357 0     Pain Loc --      Pain Edu? --      Excl. in [place]? --    Constitutional: Alert but disoriented.  No acute distress Eyes: Conjunctivae are normal. Normal extraocular movements. ENT      Head: Normocephalic with vertically oriented midline 2.5 cm frontal scalp laceration approaching the hairline      Nose: No congestion/rhinnorhea.      Mouth/Throat: Mucous membranes are moist.      Neck: No stridor. Cardiovascular: Normal rate, regular rhythm. No murmurs, rubs, or gallops. Respiratory: Normal respiratory effort without tachypnea nor retractions. Breath sounds are clear and equal bilaterally. No wheezes/rales/rhonchi. Musculoskeletal: Nontender with normal range of motion in extremities. No lower extremity tenderness nor edema. Neurologic:  Normal speech and language. No gross focal neurologic deficits are appreciated.  Skin:  Skin is warm, dry and  intact. No rash noted. Psychiatric: Mood and affect are normal.  ____________________________________________  ED COURSE:  As part of my medical decision making, I reviewed the following data within the electronic MEDICAL RECORD NUMBER History obtained from family if available, nursing notes, old chart and ekg, as well as notes from prior ED visits. Patient presented for fall with head injury, we will assess with labs and imaging as indicated at this time.   Marland Kitchen.Laceration Repair  Date/Time: 03/30/2019 2:15 PM Performed by: Emily Filbert, MD Authorized by: Emily Filbert, MD   Consent:    Consent obtained:  Verbal   Consent given by:  Patient Anesthesia (see MAR for exact dosages):    Anesthesia method:  None Laceration details:    Location:  Scalp   Scalp location:  Frontal   Length (cm):  2.5   Depth (mm):  5 Repair type:    Repair type:  Simple Pre-procedure details:    Preparation:  Patient was prepped and draped in usual sterile fashion Treatment:    Amount of cleaning:  Standard   Irrigation solution:  Tap water Skin repair:    Repair method:  Tissue adhesive Post-procedure details:    Dressing:  Open (no dressing)   Patient tolerance of procedure:  Tolerated well, no immediate complications    Bristyl Mclees was evaluated in Emergency Department on 03/30/2019 for the symptoms described in the history of present illness. She was evaluated in the context of the global COVID-19 pandemic, which necessitated consideration that the patient might be at risk for infection with the SARS-CoV-2 virus that causes COVID-19. Institutional protocols and algorithms that pertain to the evaluation of patients at risk for COVID-19 are in a state of rapid change based on information released by regulatory bodies including the CDC and federal and state organizations. These policies and algorithms were followed during the patient's care in the ED.  ____________________________________________   RADIOLOGY Images were viewed by me  CT head IMPRESSION:  Chronic atrophic and ischemic changes without acute abnormality.  ____________________________________________   DIFFERENTIAL DIAGNOSIS   Laceration, contusion, subdural, skull fracture, subarachnoid hemorrhage  FINAL ASSESSMENT AND PLAN  Fall, head injury, scalp laceration   Plan: The patient had presented for a fall with resulting head injury and scalp laceration. Patient's imaging did not reveal any acute process.  Wound was repaired as dictated above without any further rebleeding.   She is cleared for outpatient follow-up.   Ulice Dash, MD    Note: This note was generated in part or whole with voice recognition software. Voice recognition is usually quite accurate but there are transcription errors that can and very often do occur. I apologize for any typographical errors that were not detected and corrected.     Emily Filbert, MD 03/30/19 1504

## 2019-03-30 NOTE — ED Notes (Signed)
Pt had a BM while waiting for ride- pt brief changed and pt cleansed- pt unable to sign for discharge d/t dementia

## 2019-04-12 ENCOUNTER — Other Ambulatory Visit: Payer: Self-pay

## 2019-04-12 ENCOUNTER — Emergency Department: Payer: Medicare PPO

## 2019-04-12 ENCOUNTER — Inpatient Hospital Stay
Admission: EM | Admit: 2019-04-12 | Discharge: 2019-04-16 | DRG: 689 | Disposition: A | Discharge status: Home Health | Payer: Medicare PPO | Source: Skilled Nursing Facility | Attending: Family Medicine | Admitting: Family Medicine

## 2019-04-12 DIAGNOSIS — E872 Acidosis: Secondary | ICD-10-CM | POA: Diagnosis present

## 2019-04-12 DIAGNOSIS — F0390 Unspecified dementia without behavioral disturbance: Secondary | ICD-10-CM | POA: Diagnosis present

## 2019-04-12 DIAGNOSIS — Z7983 Long term (current) use of bisphosphonates: Secondary | ICD-10-CM | POA: Diagnosis not present

## 2019-04-12 DIAGNOSIS — Z87891 Personal history of nicotine dependence: Secondary | ICD-10-CM

## 2019-04-12 DIAGNOSIS — Z79899 Other long term (current) drug therapy: Secondary | ICD-10-CM | POA: Diagnosis not present

## 2019-04-12 DIAGNOSIS — I5032 Chronic diastolic (congestive) heart failure: Secondary | ICD-10-CM | POA: Diagnosis not present

## 2019-04-12 DIAGNOSIS — E1122 Type 2 diabetes mellitus with diabetic chronic kidney disease: Secondary | ICD-10-CM | POA: Diagnosis present

## 2019-04-12 DIAGNOSIS — Z86711 Personal history of pulmonary embolism: Secondary | ICD-10-CM

## 2019-04-12 DIAGNOSIS — E119 Type 2 diabetes mellitus without complications: Secondary | ICD-10-CM

## 2019-04-12 DIAGNOSIS — G309 Alzheimer's disease, unspecified: Secondary | ICD-10-CM | POA: Diagnosis present

## 2019-04-12 DIAGNOSIS — Z20822 Contact with and (suspected) exposure to covid-19: Secondary | ICD-10-CM | POA: Diagnosis present

## 2019-04-12 DIAGNOSIS — N3000 Acute cystitis without hematuria: Secondary | ICD-10-CM | POA: Diagnosis not present

## 2019-04-12 DIAGNOSIS — B961 Klebsiella pneumoniae [K. pneumoniae] as the cause of diseases classified elsewhere: Secondary | ICD-10-CM | POA: Diagnosis present

## 2019-04-12 DIAGNOSIS — I959 Hypotension, unspecified: Secondary | ICD-10-CM | POA: Diagnosis present

## 2019-04-12 DIAGNOSIS — Z90710 Acquired absence of both cervix and uterus: Secondary | ICD-10-CM

## 2019-04-12 DIAGNOSIS — F0280 Dementia in other diseases classified elsewhere without behavioral disturbance: Secondary | ICD-10-CM | POA: Diagnosis present

## 2019-04-12 DIAGNOSIS — Z794 Long term (current) use of insulin: Secondary | ICD-10-CM

## 2019-04-12 DIAGNOSIS — G9341 Metabolic encephalopathy: Secondary | ICD-10-CM | POA: Diagnosis present

## 2019-04-12 DIAGNOSIS — B359 Dermatophytosis, unspecified: Secondary | ICD-10-CM | POA: Diagnosis present

## 2019-04-12 DIAGNOSIS — R531 Weakness: Secondary | ICD-10-CM

## 2019-04-12 DIAGNOSIS — I5042 Chronic combined systolic (congestive) and diastolic (congestive) heart failure: Secondary | ICD-10-CM | POA: Diagnosis present

## 2019-04-12 DIAGNOSIS — Z7901 Long term (current) use of anticoagulants: Secondary | ICD-10-CM

## 2019-04-12 DIAGNOSIS — I130 Hypertensive heart and chronic kidney disease with heart failure and stage 1 through stage 4 chronic kidney disease, or unspecified chronic kidney disease: Secondary | ICD-10-CM | POA: Diagnosis present

## 2019-04-12 DIAGNOSIS — Z7984 Long term (current) use of oral hypoglycemic drugs: Secondary | ICD-10-CM

## 2019-04-12 DIAGNOSIS — F0150 Vascular dementia without behavioral disturbance: Secondary | ICD-10-CM | POA: Diagnosis not present

## 2019-04-12 DIAGNOSIS — N390 Urinary tract infection, site not specified: Secondary | ICD-10-CM | POA: Diagnosis present

## 2019-04-12 DIAGNOSIS — E1329 Other specified diabetes mellitus with other diabetic kidney complication: Secondary | ICD-10-CM

## 2019-04-12 DIAGNOSIS — N189 Chronic kidney disease, unspecified: Secondary | ICD-10-CM | POA: Diagnosis present

## 2019-04-12 DIAGNOSIS — E860 Dehydration: Secondary | ICD-10-CM | POA: Diagnosis present

## 2019-04-12 DIAGNOSIS — N179 Acute kidney failure, unspecified: Secondary | ICD-10-CM | POA: Diagnosis present

## 2019-04-12 DIAGNOSIS — N1832 Chronic kidney disease, stage 3b: Secondary | ICD-10-CM | POA: Diagnosis present

## 2019-04-12 DIAGNOSIS — W19XXXA Unspecified fall, initial encounter: Secondary | ICD-10-CM

## 2019-04-12 DIAGNOSIS — M810 Age-related osteoporosis without current pathological fracture: Secondary | ICD-10-CM | POA: Diagnosis present

## 2019-04-12 LAB — URINALYSIS, COMPLETE (UACMP) WITH MICROSCOPIC
Bilirubin Urine: NEGATIVE
Glucose, UA: NEGATIVE mg/dL
Ketones, ur: NEGATIVE mg/dL
Leukocytes,Ua: NEGATIVE
Nitrite: POSITIVE — AB
Protein, ur: 30 mg/dL — AB
Specific Gravity, Urine: 1.014 (ref 1.005–1.030)
Squamous Epithelial / HPF: NONE SEEN (ref 0–5)
pH: 7 (ref 5.0–8.0)

## 2019-04-12 LAB — CBC WITH DIFFERENTIAL/PLATELET
Abs Immature Granulocytes: 0.05 10*3/uL (ref 0.00–0.07)
Basophils Absolute: 0.1 10*3/uL (ref 0.0–0.1)
Basophils Relative: 1 %
Eosinophils Absolute: 0.2 10*3/uL (ref 0.0–0.5)
Eosinophils Relative: 3 %
HCT: 34 % — ABNORMAL LOW (ref 36.0–46.0)
Hemoglobin: 10.9 g/dL — ABNORMAL LOW (ref 12.0–15.0)
Immature Granulocytes: 1 %
Lymphocytes Relative: 23 %
Lymphs Abs: 1.6 10*3/uL (ref 0.7–4.0)
MCH: 28.4 pg (ref 26.0–34.0)
MCHC: 32.1 g/dL (ref 30.0–36.0)
MCV: 88.5 fL (ref 80.0–100.0)
Monocytes Absolute: 0.7 10*3/uL (ref 0.1–1.0)
Monocytes Relative: 10 %
Neutro Abs: 4.4 10*3/uL (ref 1.7–7.7)
Neutrophils Relative %: 62 %
Platelets: 235 10*3/uL (ref 150–400)
RBC: 3.84 MIL/uL — ABNORMAL LOW (ref 3.87–5.11)
RDW: 13.2 % (ref 11.5–15.5)
WBC: 7 10*3/uL (ref 4.0–10.5)
nRBC: 0 % (ref 0.0–0.2)

## 2019-04-12 LAB — BASIC METABOLIC PANEL
Anion gap: 10 (ref 5–15)
BUN: 19 mg/dL (ref 8–23)
CO2: 28 mmol/L (ref 22–32)
Calcium: 9.5 mg/dL (ref 8.9–10.3)
Chloride: 101 mmol/L (ref 98–111)
Creatinine, Ser: 1.33 mg/dL — ABNORMAL HIGH (ref 0.44–1.00)
GFR calc Af Amer: 42 mL/min — ABNORMAL LOW (ref >60)
GFR calc non Af Amer: 36 mL/min — ABNORMAL LOW (ref >60)
Glucose, Bld: 153 mg/dL — ABNORMAL HIGH (ref 70–99)
Potassium: 3.7 mmol/L (ref 3.5–5.1)
Sodium: 139 mmol/L (ref 135–145)

## 2019-04-12 LAB — POC SARS CORONAVIRUS 2 AG: SARS Coronavirus 2 Ag: NEGATIVE

## 2019-04-12 LAB — LACTIC ACID, PLASMA
Lactic Acid, Venous: 3.8 mmol/L (ref 0.5–1.9)
Lactic Acid, Venous: 3.2 mmol/L (ref 0.5–1.9)

## 2019-04-12 LAB — TROPONIN I (HIGH SENSITIVITY)
Troponin I (High Sensitivity): 13 ng/L (ref <18)
Troponin I (High Sensitivity): 14 ng/L (ref <18)

## 2019-04-12 LAB — PROCALCITONIN: Procalcitonin: <0.1 ng/mL

## 2019-04-12 MED ORDER — SODIUM CHLORIDE 0.9 % IV BOLUS: 500.0000 mL | Freq: Once | INTRAVENOUS | Status: DC

## 2019-04-12 MED ORDER — SODIUM CHLORIDE 0.9 % IV BOLUS
500.0000 mL | Freq: Once | INTRAVENOUS | Status: AC
  Administered 2019-04-12: 500 mL via INTRAVENOUS

## 2019-04-12 MED ORDER — SODIUM CHLORIDE 0.9 % IV SOLN
1.0000 g | Freq: Once | INTRAVENOUS | Status: AC
  Administered 2019-04-12: 1 g via INTRAVENOUS
  Filled 2019-04-12: qty 10

## 2019-04-12 NOTE — ED Triage Notes (Signed)
Patient coming [facility] from [facility] for weakness. Patient has hx of dementia. Patient c/o dizziness and headache. Hypotensive at 92 systolic at facility. 120/60 and 110/70 for EMS. Temp 97 oral.

## 2019-04-12 NOTE — ED Provider Notes (Signed)
MSE was initiated and I personally evaluated the patient and placed orders (if any) at  11:16 PM on April 12, 2019.  The patient appears stable so that the remainder of the MSE may be completed by another provider.  Assumed care for patient from attending, Dr. Derrill Kay.  Patient was hypotensive at her care facility and seemed confused and weak.   Patient was hypotensive at triage. Patient had significant lactic acidosis, 3.8 and had findings concerning for cystitis on urinalysis with nitrates, bacteria and a small amount of blood.  No leukocytosis on CBC.  Covid testing was negative.  Chest x-ray reveals no consolidations, opacities or infiltrates.  Troponin was within reference range and EKG revealed no ischemic changes.  CT head revealed no evidence of intracranial bleed.  Will admit to medicine for likely urosepsis.  Patient was given 500 cc of normal saline and a gram of Rocephin prior to admission.   Pia Mau [place], PA-C 04/12/19 2320    Phineas Semen, MD 04/13/19 1153

## 2019-04-12 NOTE — ED Triage Notes (Signed)
EMS not present upon assumption of care- no report received. Pt states she somehow fell (unsure if slipped or lost balance). Pt denies and loc or head injury. Pt c/o back and buttock pain. No obvious deformity or bleeding noted.  Pt is oriented to self only. NAD noted.

## 2019-04-12 NOTE — ED Notes (Signed)
[facility] Paper[employer] in pt room. EMS note states pt had moderna-covid vaccine.

## 2019-04-12 NOTE — H&P (Signed)
History and Physical   Jasmine Hubbard WGN:[id number] DOB: 01/25/1932 DIsaac LaudOA: 04/12/2019  Referring MD/NP/PA: Dr. Fuller PlanFunke  PCP: Lynnda ShieldsGoswick, Anna, MD   Outpatient Specialists: None  Patient coming from: Skilled facility at [facility]  Chief Complaint: Confusion  HPI: Jasmine Hubbard is a 86 y.o. female with medical history significant of Alzheimer's and vascular dementia, diabetes, hypertension, history of PE on chronic anticoagulation, diastolic dysfunction CHF, systolic dysfunction who was brought in from skilled facility with generalized weakness, hypotension, dizziness and headache.  Systolic was 92 at the facility.  She had a temperature of 97.  EMS called and patient was brought to the ER.  In the ER patient was noted to have lactic acidosis.  Also evidence of UTI and worsening confusion.  Patient being admitted with acute metabolic encephalopathy from UTI..  ED Course: Temperature is 98.2 blood pressure 95/65 pulse 83 respirate 19 oxygen sat 94% room air.  White count 7.1 hemoglobin 10.9 and platelet 235.  Chemistry showed a creatinine of 1.33 with BUN 19.  Lactic acid 3.2 and glucose 153.  Urinalysis is positive for nitrites, no leukocytes but bacteria.  Chest x-ray is negative.  Head CT and CT neck are all negative.  Patient is being admitted with metabolic encephalopathy most likely secondary to UTI.  Urine and blood cultures have been obtained  Review of Systems: As per HPI otherwise 10 point review of systems negative.    Past Medical History:  Diagnosis Date  . CHF (congestive heart failure) (HCC)   . Diabetes mellitus without complication (HCC)   . Left ventricular failure (HCC)   . Osteoporosis   . Pulmonary embolism (HCC)   . Pulmonary embolus (HCC)   . Vascular dementia [facility](HCC)     Past Surgical History:  Procedure Laterality Date  . ABDOMINAL HYSTERECTOMY    . BACK SURGERY    . FRACTURE SURGERY     elbow     reports that she quit smoking about 19  years ago. Her smoking use included cigarettes. She has never used smokeless tobacco. She reports previous alcohol use. She reports that she does not use drugs.  Allergies  Allergen Reactions  . Naproxen Hives and Shortness Of Breath    History reviewed. No pertinent family history.   Prior to Admission medications   Medication Sig Start Date End Date Taking? Authorizing Provider  acetaminophen (TYLENOL) 325 MG tablet Take 650 mg by mouth every 6 (six) hours as needed for mild pain.    [provider]  alendronate (FOSAMAX) 70 MG tablet Take 70 mg by mouth every Friday. Take with a full glass of water on an empty stomach.     [provider]  alum & mag hydroxide-simeth (MINTOX) 200-200-20 MG/5ML suspension Take 30 mLs by mouth every 6 (six) hours as needed for indigestion or heartburn.    [provider]  apixaban (ELIQUIS) 2.5 MG TABS tablet Take 2.5 mg by mouth 2 (two) times daily.     [provider]  Calcium Carb-Cholecalciferol 600-400 MG-UNIT TABS Take 1 tablet by mouth 2 (two) times a day.     [provider]  Calcium Citrate-Vitamin D 250-200 MG-UNIT TABS Take 2 tablets by mouth at bedtime.     [provider]  citalopram (CELEXA) 10 MG tablet Take 10 mg by mouth daily.    [provider]  furosemide (LASIX) 20 MG tablet Take 20 mg by mouth daily.     [provider]  guaifenesin (  ROBITUSSIN) 100 MG/5ML syrup Take 200 mg by mouth every 6 (six) hours as needed for cough or congestion. (max 4 doses daily)    [provider]  hydrOXYzine (ATARAX/VISTARIL) 25 MG tablet Take 25 mg by mouth 2 (two) times daily as needed for anxiety.    [provider]  loperamide (IMODIUM) 2 MG capsule Take 2 mg by mouth as needed for diarrhea or loose stools. (max 8 doses in 24 hours)    [provider]  lovastatin (MEVACOR) 20 MG tablet Take 40 mg by mouth at bedtime.    [provider]  magnesium  hydroxide (MILK OF MAGNESIA) 400 MG/5ML suspension Take 30 mLs by mouth daily as needed for mild constipation.    [provider]  metFORMIN (GLUCOPHAGE) 500 MG tablet Take 500 mg by mouth 2 (two) times daily with a meal.    [provider]  neomycin-bacitracin-polymyxin (NEOSPORIN) OINT Apply 1 application topically 4 (four) times daily as needed for wound care.    [provider]  sodium chloride (OCEAN) 0.65 % SOLN nasal spray Place 1 spray into both nostrils 3 (three) times daily.     [provider]    Physical Exam: Vitals:   04/12/19 1938 04/12/19 1939 04/12/19 2315  BP: (!) 105/47  95/65  Pulse:   83  Resp: 19  16  Temp: (!) 97.5 F (36.4 C)  98.2 F (36.8 C)  TempSrc: Oral    SpO2: 95%  94%  Weight:  58.1 kg   Height:  5\' 2"  (1.575 m)       Constitutional: Confused, no acute distress Vitals:   04/12/19 1938 04/12/19 1939 04/12/19 2315  BP: (!) 105/47  95/65  Pulse:   83  Resp: 19  16  Temp: (!) 97.5 F (36.4 C)  98.2 F (36.8 C)  TempSrc: Oral    SpO2: 95%  94%  Weight:  58.1 kg   Height:  5\' 2"  (1.575 m)    Eyes: PERRL, lids and conjunctivae normal ENMT: Mucous membranes are moist. Posterior pharynx clear of any exudate or lesions.Normal dentition.  Neck: normal, supple, no masses, no thyromegaly Respiratory: clear to auscultation bilaterally, no wheezing, no crackles. Normal respiratory effort. No accessory muscle use.  Cardiovascular: Sinus tachycardia, no murmurs / rubs / gallops. No extremity edema. 2+ pedal pulses. No carotid bruits.  Abdomen: no tenderness, no masses palpated. No hepatosplenomegaly. Bowel sounds positive.  Musculoskeletal: no clubbing / cyanosis. No joint deformity upper and lower extremities. Good ROM, no contractures. Normal muscle tone.  Skin: no rashes, lesions, ulcers. No induration Neurologic: CN 2-12 grossly intact. Sensation intact, DTR normal. Strength 5/5 in all 4.  Psychiatric: Confused,  disoriented in place and time,    Labs on Admission: I have personally reviewed following labs and imaging studies  CBC: Recent Labs  Lab 04/12/19 1943  WBC 7.0  NEUTROABS 4.4  HGB 10.9*  HCT 34.0*  MCV 88.5  PLT 509   Basic Metabolic Panel: Recent Labs  Lab 04/12/19 1943  NA 139  K 3.7  CL 101  CO2 28  GLUCOSE 153*  BUN 19  CREATININE 1.33*  CALCIUM 9.5   GFR: Estimated Creatinine Clearance: 24.5 mL/min (A) (by C-G formula based on SCr of 1.33 mg/dL (H)). Liver Function Tests: No results for input(s): AST, ALT, ALKPHOS, BILITOT, PROT, ALBUMIN in the last 168 hours. No results for input(s): LIPASE, AMYLASE in the last 168 hours. No results for input(s): AMMONIA in the last  168 hours. Coagulation Profile: No results for input(s): INR, PROTIME in the last 168 hours. Cardiac Enzymes: No results for input(s): CKTOTAL, CKMB, CKMBINDEX, TROPONINI in the last 168 hours. BNP (last 3 results) No results for input(s): PROBNP in the last 8760 hours. HbA1C: No results for input(s): HGBA1C in the last 72 hours. CBG: No results for input(s): GLUCAP in the last 168 hours. Lipid Profile: No results for input(s): CHOL, HDL, LDLCALC, TRIG, CHOLHDL, LDLDIRECT in the last 72 hours. Thyroid Function Tests: No results for input(s): TSH, T4TOTAL, FREET4, T3FREE, THYROIDAB in the last 72 hours. Anemia Panel: No results for input(s): VITAMINB12, FOLATE, FERRITIN, TIBC, IRON, RETICCTPCT in the last 72 hours. Urine analysis:    Component Value Date/Time   COLORURINE YELLOW (A) 04/12/2019 1943   APPEARANCEUR CLOUDY (A) 04/12/2019 1943   LABSPEC 1.014 04/12/2019 1943   PHURINE 7.0 04/12/2019 1943   GLUCOSEU NEGATIVE 04/12/2019 1943   HGBUR SMALL (A) 04/12/2019 1943   BILIRUBINUR NEGATIVE 04/12/2019 1943   KETONESUR NEGATIVE 04/12/2019 1943   PROTEINUR 30 (A) 04/12/2019 1943   NITRITE POSITIVE (A) 04/12/2019 1943   LEUKOCYTESUR NEGATIVE 04/12/2019 1943   Sepsis  Labs: @LABRCNTIP (procalcitonin:4,lacticidven:4) )No results found for this or any previous visit (from the past 240 hour(s)).   Radiological Exams on Admission: DG Chest 1 View  Result Date: 04/12/2019 CLINICAL DATA:  Weakness. Possible fall. EXAM: CHEST  1 VIEW COMPARISON:  Radiograph 02/10/2019 FINDINGS: The cardiomediastinal contours are normal. Aortic atherosclerosis. Pulmonary vasculature is normal. No consolidation, pleural effusion, or pneumothorax. No acute osseous abnormalities are seen. The bones are under mineralized. No acute osseous abnormalities are seen. IMPRESSION: No acute abnormality. Electronically Signed   By: 02/12/2019 M.D.   On: 04/12/2019 20:58   CT Head Wo Contrast  Result Date: 04/12/2019 CLINICAL DATA:  Fall.  Weakness.  Dementia.  Headache.  Hypotension. EXAM: CT HEAD WITHOUT CONTRAST CT CERVICAL SPINE WITHOUT CONTRAST TECHNIQUE: Multidetector CT imaging of the head and cervical spine was performed following the standard protocol without intravenous contrast. Multiplanar CT image reconstructions of the cervical spine were also generated. COMPARISON:  03/30/2019 FINDINGS: CT HEAD FINDINGS Brain: Advanced generalized brain atrophy. Chronic small-vessel ischemic changes affecting the pons, thalami, basal ganglia and cerebral hemispheric white matter. No cortical or large vessel territory infarction. No mass lesion, hemorrhage, hydrocephalus or extra-axial collection. Vascular: There is atherosclerotic calcification of the major vessels at the base of the brain. Skull: No skull fracture. Sinuses/Orbits: Clear/normal Other: None CT CERVICAL SPINE FINDINGS Alignment: Normal Skull base and vertebrae: No acute fracture. Healing minor superior endplate fracture at T1, which was acute in October of last year. Soft tissues and spinal canal: Normal Disc levels: Ordinary osteoarthritis at the C1-2 articulation. Normal disc spaces from C2-3 through C5-6. Disc space narrowing at C6-7  with small osteophytes but no significant stenosis. Upper chest: Negative Other: None IMPRESSION: Head CT: No acute finding. Advanced atrophy and chronic small-vessel ischemic changes as above. Cervical spine CT: No acute finding. Healing superior endplate fracture at T1 without further collapse. Electronically Signed   By: November M.D.   On: 04/12/2019 20:18   CT Cervical Spine Wo Contrast  Result Date: 04/12/2019 CLINICAL DATA:  Fall.  Weakness.  Dementia.  Headache.  Hypotension. EXAM: CT HEAD WITHOUT CONTRAST CT CERVICAL SPINE WITHOUT CONTRAST TECHNIQUE: Multidetector CT imaging of the head and cervical spine was performed following the standard protocol without intravenous contrast. Multiplanar CT image reconstructions of the cervical spine were also generated. COMPARISON:  03/30/2019 FINDINGS: CT HEAD FINDINGS Brain: Advanced generalized brain atrophy. Chronic small-vessel ischemic changes affecting the pons, thalami, basal ganglia and cerebral hemispheric white matter. No cortical or large vessel territory infarction. No mass lesion, hemorrhage, hydrocephalus or extra-axial collection. Vascular: There is atherosclerotic calcification of the major vessels at the base of the brain. Skull: No skull fracture. Sinuses/Orbits: Clear/normal Other: None CT CERVICAL SPINE FINDINGS Alignment: Normal Skull base and vertebrae: No acute fracture. Healing minor superior endplate fracture at T1, which was acute in October of last year. Soft tissues and spinal canal: Normal Disc levels: Ordinary osteoarthritis at the C1-2 articulation. Normal disc spaces from C2-3 through C5-6. Disc space narrowing at C6-7 with small osteophytes but no significant stenosis. Upper chest: Negative Other: None IMPRESSION: Head CT: No acute finding. Advanced atrophy and chronic small-vessel ischemic changes as above. Cervical spine CT: No acute finding. Healing superior endplate fracture at T1 without further collapse. Electronically  Signed   By: Paulina Fusi M.D.   On: 04/12/2019 20:18    EKG: Independently reviewed.  Normal sinus rhythm with no significant ST change  Assessment/Plan Principal Problem:   UTI (urinary tract infection) Active Problems:   Dementia (HCC)   Acute metabolic encephalopathy   Diabetes (HCC)   Chronic diastolic CHF (congestive heart failure) (HCC)     #1 UTI: Urinalysis not very impressive except for positive nitrites.  Initiate IV Rocephin.  Await urine culture sensitivities as well as blood cultures.  Monitor patient's response.  #2 acute metabolic encephalopathy: In the setting of known dementia.  Treat UTI.  Observe in the hospital.  May require gentle hydration although history of CHF.  #3 CHF: Patient has history of both systolic and diastolic CHF.  We will be careful with any fluids at this point.  Appears compensated  #4 diabetes: Sliding scale insulin.  #5 history of PE: Patient is on chronic anticoagulation.  Continue  #6 dementia: Suspected vascular dementia.  At this point she is stable.  Continue treatment.  #7 hypotension: Blood pressure is now normalized.  Most likely dehydration.  Continue to monitor  #8 acute kidney injury: BUN/creatinine is slightly elevated.  Monitor while in the hospital.   DVT prophylaxis: Apixaban Code Status: Full code Family Communication: No family at bedside Disposition Plan: Back to skilled facility Consults called: None Admission status: Inpatient  Severity of Illness: The appropriate patient status for this patient is INPATIENT. Inpatient status is judged to be reasonable and necessary in order to provide the required intensity of service to ensure the patient's safety. The patient's presenting symptoms, physical exam findings, and initial radiographic and laboratory data in the context of their chronic comorbidities is felt to place them at high risk for further clinical deterioration. Furthermore, it is not anticipated that the  patient will be medically stable for discharge from the hospital within 2 midnights of admission. The following factors support the patient status of inpatient.   " The patient's presenting symptoms include dizziness and hypotension. " The worrisome physical exam findings include confusion. " The initial radiographic and laboratory data are worrisome because of possible UTI. " The chronic co-morbidities include vascular dementia.   * I certify that at the point of admission it is my clinical judgment that the patient will require inpatient hospital care spanning beyond 2 midnights from the point of admission due to high intensity of service, high risk for further deterioration and high frequency of surveillance required.Lonia Blood MD [facility] Pager [phone]  [phone]  If 7PM-7AM, please contact night-coverage [email or web address] Password [facility]  04/12/2019, 11:25 PM

## 2019-04-12 NOTE — ED Notes (Signed)
Daughter Toniann Fail called at this time- update provided. Daughter wishes to know when pt is admitted or states she will take pt if she is discharged.

## 2019-04-12 NOTE — ED Provider Notes (Signed)
[facility] Emergency Department Provider Note  ____________________________________________   I have reviewed the triage vital signs and the nursing notes.   HISTORY  Chief Complaint Fall   History limited by and level 5 caveat due to: Dementia   HPI Gearl Kimbrough is a 86 y.o. female who presents to the emergency department today for apparent weakness. The patient herself is unable to give any significant history. She does state that she thinks she might of fallen today but cannot say why. The patient apparently had low blood pressure at her facility however for EMS her blood pressure was normal. Patient denies any pain to myself. Denies any headache, back pain or neck pain. She denies any recent illness.   Records reviewed. Per medical record review patient has a history of CHF, DM.   Past Medical History:  Diagnosis Date  . CHF (congestive heart failure) ([place])   . Diabetes mellitus without complication ([place])   . Left ventricular failure ([place])   . Osteoporosis   . Pulmonary embolism ([place])   . Pulmonary embolus ([place])   . Vascular dementia ([place])     There are no problems to display for this patient.   Past Surgical History:  Procedure Laterality Date  . ABDOMINAL HYSTERECTOMY    . BACK SURGERY    . FRACTURE SURGERY     elbow    Prior to Admission medications   Medication Sig Start Date End Date Taking? Authorizing Provider  acetaminophen (TYLENOL) 325 MG tablet Take 650 mg by mouth every 6 (six) hours as needed for mild pain.    [provider]  alendronate (FOSAMAX) 70 MG tablet Take 70 mg by mouth every Friday. Take with a full glass of water on an empty stomach.     [provider]  alum & mag hydroxide-simeth ([place]) 200-200-20 MG/5ML suspension Take 30 mLs by mouth every 6 (six) hours as needed for indigestion or heartburn.    [provider]  apixaban (ELIQUIS) 2.5 MG TABS tablet Take 2.5 mg by mouth  2 (two) times daily.     [provider]  Calcium Carb-Cholecalciferol 600-400 MG-UNIT TABS Take 1 tablet by mouth 2 (two) times a day.     [provider]  Calcium Citrate-Vitamin D 250-200 MG-UNIT TABS Take 2 tablets by mouth at bedtime.     [provider]  citalopram (CELEXA) 10 MG tablet Take 10 mg by mouth daily.    [provider]  furosemide (LASIX) 20 MG tablet Take 20 mg by mouth daily.     [provider]  guaifenesin (ROBITUSSIN) 100 MG/5ML syrup Take 200 mg by mouth every 6 (six) hours as needed for cough or congestion. (max 4 doses daily)    [provider]  hydrOXYzine (ATARAX/VISTARIL) 25 MG tablet Take 25 mg by mouth 2 (two) times daily as needed for anxiety.    [provider]  loperamide (IMODIUM) 2 MG capsule Take 2 mg by mouth as needed for diarrhea or loose stools. (max 8 doses in 24 hours)    [provider]  lovastatin (MEVACOR) 20 MG tablet Take 40 mg by mouth at bedtime.    [provider]  magnesium hydroxide (MILK OF MAGNESIA) 400 MG/5ML suspension Take 30 mLs by mouth daily as needed for mild constipation.    [provider]  metFORMIN (GLUCOPHAGE) 500 MG tablet Take 500 mg by mouth 2 (two) times daily with a meal.    [provider]  neomycin-bacitracin-polymyxin (NEOSPORIN) OINT Apply 1 application topically 4 (four) times daily as needed for wound care.    [provider]  sodium chloride (OCEAN) 0.65 % SOLN nasal spray Place 1 spray into both nostrils 3 (three) times daily.     [provider]    Allergies Naproxen  History reviewed. No pertinent family history.  Social History Social History   Tobacco Use  . Smoking status: Former Smoker    Types: Cigarettes    Quit date: 08/28/1999    Years since quitting: 19.6  . Smokeless tobacco: Never Used  Substance Use Topics  . Alcohol use: Not Currently  . Drug use: Never    Review of  Systems Unable to obtain reliable ROS ____________________________________________   PHYSICAL EXAM:  VITAL SIGNS: ED Triage Vitals  Enc Vitals Group     BP 04/12/19 1938 (!) 105/47     Pulse --      Resp 04/12/19 1938 19     Temp 04/12/19 1938 (!) 97.5 F (36.4 C)     Temp Source 04/12/19 1938 Oral     SpO2 04/12/19 1938 95 %     Weight 04/12/19 1939 128 lb 1.4 oz (58.1 kg)     Height 04/12/19 1939 5\' 2"  (1.575 m)     Head Circumference --      Peak Flow --      Pain Score 04/12/19 1939 4   Constitutional: Awake and alert. Oriented to location and name, not completely oriented to events.  Eyes: Conjunctivae are normal.  ENT      Head: Normocephalic and atraumatic.      Nose: No congestion/rhinnorhea.      Mouth/Throat: Mucous membranes are moist.      Neck: No stridor. No midline tenderness.  Hematological/Lymphatic/Immunilogical: No cervical lymphadenopathy. Cardiovascular: Normal rate, regular rhythm.  No murmurs, rubs, or gallops.  Respiratory: Normal respiratory effort without tachypnea nor retractions. Breath sounds are clear and equal bilaterally. No wheezes/rales/rhonchi. Gastrointestinal: Soft and non tender. No rebound. No guarding.  Genitourinary: Deferred Musculoskeletal: Normal range of motion in all extremities. No lower extremity edema. No deformity or tenderness to extremities. No tenderness to palpation or manipulation of hips.  Neurologic:  Dementia. Moving all extremities. Sensation intact.  Skin:  Skin is warm, dry and intact. No rash noted.  ____________________________________________    LABS (pertinent positives/negatives)  Pending  ____________________________________________   EKG  04/14/19, attending physician, personally viewed and interpreted this EKG  EKG Time: 2016 Rate: 74 Rhythm: normal sinus rhythm Axis: normal Intervals: qtc 439 QRS: narrow, LVH, q waves V1, v2 ST changes: no st elevation Impression: abnormal  ekg   ____________________________________________    RADIOLOGY  CT head/cervical spine No acute osseous abnormality  ____________________________________________   PROCEDURES  Procedures  ____________________________________________   INITIAL IMPRESSION / ASSESSMENT AND PLAN / ED COURSE  Pertinent labs & imaging results that were available during my care of the patient were reviewed by me and considered in my medical decision making (see chart for details).   Patient presented to the emergency department today from living facility. Concern for weakness and possible fall. No obvious traumatic injuries on exam. Will initiate broad work up, although it does appear patient has history of multiple falls for which she has been to the emergency department.   ____________________________________________   FINAL CLINICAL IMPRESSION(S) / ED DIAGNOSES  Weakness Fall  Note: This dictation was prepared with Dragon dictation. Any transcriptional errors that result from  this process are unintentional     Phineas Semen, MD 04/12/19 2023

## 2019-04-13 LAB — CBC
HCT: 28.9 % — ABNORMAL LOW (ref 36.0–46.0)
Hemoglobin: 9.4 g/dL — ABNORMAL LOW (ref 12.0–15.0)
MCH: 28.6 pg (ref 26.0–34.0)
MCHC: 32.5 g/dL (ref 30.0–36.0)
MCV: 87.8 fL (ref 80.0–100.0)
Platelets: 192 10*3/uL (ref 150–400)
RBC: 3.29 MIL/uL — ABNORMAL LOW (ref 3.87–5.11)
RDW: 13.2 % (ref 11.5–15.5)
WBC: 6.2 10*3/uL (ref 4.0–10.5)
nRBC: 0 % (ref 0.0–0.2)

## 2019-04-13 LAB — RENAL FUNCTION PANEL
Albumin: 3 g/dL — ABNORMAL LOW (ref 3.5–5.0)
Anion gap: 12 (ref 5–15)
BUN: 20 mg/dL (ref 8–23)
CO2: 27 mmol/L (ref 22–32)
Calcium: 9.3 mg/dL (ref 8.9–10.3)
Chloride: 101 mmol/L (ref 98–111)
Creatinine, Ser: 1.29 mg/dL — ABNORMAL HIGH (ref 0.44–1.00)
GFR calc Af Amer: 44 mL/min — ABNORMAL LOW (ref >60)
GFR calc non Af Amer: 38 mL/min — ABNORMAL LOW (ref >60)
Glucose, Bld: 102 mg/dL — ABNORMAL HIGH (ref 70–99)
Phosphorus: 2.4 mg/dL — ABNORMAL LOW (ref 2.5–4.6)
Potassium: 3.8 mmol/L (ref 3.5–5.1)
Sodium: 140 mmol/L (ref 135–145)

## 2019-04-13 LAB — HEMOGLOBIN A1C
Hgb A1c MFr Bld: 5.9 % — ABNORMAL HIGH (ref 4.8–5.6)
Mean Plasma Glucose: 122.63 mg/dL

## 2019-04-13 LAB — GLUCOSE, CAPILLARY
Glucose-Capillary: 102 mg/dL — ABNORMAL HIGH (ref 70–99)
Glucose-Capillary: 105 mg/dL — ABNORMAL HIGH (ref 70–99)
Glucose-Capillary: 108 mg/dL — ABNORMAL HIGH (ref 70–99)
Glucose-Capillary: 116 mg/dL — ABNORMAL HIGH (ref 70–99)
Glucose-Capillary: 90 mg/dL (ref 70–99)

## 2019-04-13 LAB — PROCALCITONIN: Procalcitonin: <0.1 ng/mL

## 2019-04-13 MED ORDER — ONDANSETRON HCL 4 MG PO TABS: 4.0000 mg | ORAL_TABLET | Freq: Four times a day (QID) | ORAL | Status: DC | PRN

## 2019-04-13 MED ORDER — APIXABAN 2.5 MG PO TABS
2.5000 mg | ORAL_TABLET | Freq: Two times a day (BID) | ORAL | Status: DC
  Administered 2019-04-13 – 2019-04-16 (×8): 2.5 mg via ORAL
  Filled 2019-04-13 (×9): qty 1

## 2019-04-13 MED ORDER — ONDANSETRON HCL 4 MG/2ML IJ SOLN: 4.0000 mg | Freq: Four times a day (QID) | INTRAMUSCULAR | Status: DC | PRN

## 2019-04-13 MED ORDER — INSULIN ASPART 100 UNIT/ML ~~LOC~~ SOLN
0.0000 [IU] | Freq: Three times a day (TID) | SUBCUTANEOUS | Status: DC
  Administered 2019-04-15 – 2019-04-16 (×3): 1 [IU] via SUBCUTANEOUS
  Filled 2019-04-13 (×3): qty 1

## 2019-04-13 MED ORDER — INSULIN ASPART 100 UNIT/ML ~~LOC~~ SOLN: 0.0000 [IU] | Freq: Every day | SUBCUTANEOUS | Status: DC

## 2019-04-13 MED ORDER — SODIUM CHLORIDE 0.9 % IV SOLN
1.0000 g | INTRAVENOUS | Status: DC
  Administered 2019-04-13 – 2019-04-14 (×2): 1 g via INTRAVENOUS
  Filled 2019-04-13: qty 10
  Filled 2019-04-13 (×2): qty 1

## 2019-04-13 MED ORDER — SODIUM CHLORIDE 0.9 % IV SOLN: 1.0000 g | INTRAVENOUS | Status: DC

## 2019-04-13 NOTE — Progress Notes (Signed)
Spoke via phone with patient's daughter Arline Asp about patients plan of care and update on patients current condition.

## 2019-04-13 NOTE — Progress Notes (Signed)
Unable to complete admission history questions due to patient being unable to answer questions appropriately.

## 2019-04-13 NOTE — Progress Notes (Addendum)
Subjective: Patient appears to be very slow and sleepy this morning.  Had her breakfast tray however she was not eating.  When asked she said she was not hungry.  Patient also was not oriented to place and time.  Objective: Vital signs in last 24 hours: Temp:  [97.5 F (36.4 C)-98.2 F (36.8 C)] 97.9 F (36.6 C) (01/16 1142) Pulse Rate:  [64-83] 64 (01/16 1142) Resp:  [14-20] 16 (01/16 1142) BP: (95-114)/(47-79) 114/79 (01/16 1142) SpO2:  [94 %-97 %] 97 % (01/16 1142) Weight:  [58.1 kg] 58.1 kg (01/15 1939)  Intake/Output from previous day: 01/15 0701 - 01/16 0700 In: 100 [P.O.:100] Out: -  Intake/Output this shift: Total I/O In: 18 [P.O.:18] Out: -   ENMT: Mucous membranes are moist. Posterior pharynx clear of any exudate or lesions.Normal dentition.  Neck: normal, supple, no masses, no thyromegaly Respiratory: clear to auscultation bilaterally, no wheezing, no crackles. Normal respiratory effort. No accessory muscle use.  Cardiovascular:  Regular, rate and rhythm; no murmurs / rubs / gallops. No extremity edema. 2+ pedal pulses. No carotid bruits.  Abdomen: no tenderness, no masses palpated. No hepatosplenomegaly. Bowel sounds positive.  Musculoskeletal: no clubbing / cyanosis. No joint deformity upper and lower extremities. Good ROM, no contractures. Normal muscle tone.  Skin: no rashes, lesions, ulcers. No induration Neurologic: CN 2-12 grossly intact. Sensation intact, DTR normal. Strength 5/5 in all 4.  Psychiatric:  Lethargic, disoriented in place and time  Results for orders placed or performed during the hospital encounter of 04/12/19 (from the past 24 hour(s))  CBC with Differential     Status: Abnormal   Collection Time: 04/12/19  7:43 PM  Result Value Ref Range   WBC 7.0 4.0 - 10.5 K/uL   RBC 3.84 (L) 3.87 - 5.11 MIL/uL   Hemoglobin 10.9 (L) 12.0 - 15.0 g/dL   HCT 07.6 (L) 22.6 - 33.3 %   MCV 88.5 80.0 - 100.0 fL   MCH 28.4 26.0 - 34.0 pg   MCHC 32.1 30.0 -  36.0 g/dL   RDW 54.5 62.5 - 63.8 %   Platelets 235 150 - 400 K/uL   nRBC 0.0 0.0 - 0.2 %   Neutrophils Relative % 62 %   Neutro Abs 4.4 1.7 - 7.7 K/uL   Lymphocytes Relative 23 %   Lymphs Abs 1.6 0.7 - 4.0 K/uL   Monocytes Relative 10 %   Monocytes Absolute 0.7 0.1 - 1.0 K/uL   Eosinophils Relative 3 %   Eosinophils Absolute 0.2 0.0 - 0.5 K/uL   Basophils Relative 1 %   Basophils Absolute 0.1 0.0 - 0.1 K/uL   Immature Granulocytes 1 %   Abs Immature Granulocytes 0.05 0.00 - 0.07 K/uL  Basic metabolic panel     Status: Abnormal   Collection Time: 04/12/19  7:43 PM  Result Value Ref Range   Sodium 139 135 - 145 mmol/L   Potassium 3.7 3.5 - 5.1 mmol/L   Chloride 101 98 - 111 mmol/L   CO2 28 22 - 32 mmol/L   Glucose, Bld 153 (H) 70 - 99 mg/dL   BUN 19 8 - 23 mg/dL   Creatinine, Ser 9.37 (H) 0.44 - 1.00 mg/dL   Calcium 9.5 8.9 - 34.2 mg/dL   GFR calc non Af Amer 36 (L) >60 mL/min   GFR calc Af Amer 42 (L) >60 mL/min   Anion gap 10 5 - 15  Troponin I (High Sensitivity)     Status: None   Collection  Time: 04/12/19  7:43 PM  Result Value Ref Range   Troponin I (High Sensitivity) 13 <18 ng/L  Lactic acid, plasma     Status: Abnormal   Collection Time: 04/12/19  7:43 PM  Result Value Ref Range   Lactic Acid, Venous 3.8 (HH) 0.5 - 1.9 mmol/L  Urinalysis, Complete w Microscopic     Status: Abnormal   Collection Time: 04/12/19  7:43 PM  Result Value Ref Range   Color, Urine YELLOW (A) YELLOW   APPearance CLOUDY (A) CLEAR   Specific Gravity, Urine 1.014 1.005 - 1.030   pH 7.0 5.0 - 8.0   Glucose, UA NEGATIVE NEGATIVE mg/dL   Hgb urine dipstick SMALL (A) NEGATIVE   Bilirubin Urine NEGATIVE NEGATIVE   Ketones, ur NEGATIVE NEGATIVE mg/dL   Protein, ur 30 (A) NEGATIVE mg/dL   Nitrite POSITIVE (A) NEGATIVE   Leukocytes,Ua NEGATIVE NEGATIVE   RBC / HPF 0-5 0 - 5 RBC/hpf   WBC, UA 0-5 0 - 5 WBC/hpf   Bacteria, UA RARE (A) NONE SEEN   Squamous Epithelial / LPF NONE SEEN 0 - 5    Mucus PRESENT   Procalcitonin - Baseline     Status: None   Collection Time: 04/12/19  7:43 PM  Result Value Ref Range   Procalcitonin <0.10 ng/mL  Hemoglobin A1c     Status: Abnormal   Collection Time: 04/12/19  7:43 PM  Result Value Ref Range   Hgb A1c MFr Bld 5.9 (H) 4.8 - 5.6 %   Mean Plasma Glucose 122.63 mg/dL  Blood culture (routine x 2)     Status: None (Preliminary result)   Collection Time: 04/12/19  9:00 PM   Specimen: BLOOD  Result Value Ref Range   Specimen Description      BLOOD Blood Culture results may not be optimal due to an excessive volume of blood received in culture bottles   Special Requests      BOTTLES DRAWN AEROBIC AND ANAEROBIC LEFT ANTECUBITAL   Culture      NO GROWTH < 12 HOURS Performed at [facility], [address] [place], [place] [address]    Report Status PENDING   Blood culture (routine x 2)     Status: None (Preliminary result)   Collection Time: 04/12/19  9:03 PM   Specimen: BLOOD  Result Value Ref Range   Specimen Description BLOOD RIGHT ANTECUBITAL    Special Requests      BOTTLES DRAWN AEROBIC AND ANAEROBIC Blood Culture results may not be optimal due to an excessive volume of blood received in culture bottles   Culture      NO GROWTH < 12 HOURS Performed at [facility], [address] [place], [place] [address]    Report Status PENDING   Lactic acid, plasma     Status: Abnormal   Collection Time: 04/12/19  9:35 PM  Result Value Ref Range   Lactic Acid, Venous 3.2 (HH) 0.5 - 1.9 mmol/L  Troponin I (High Sensitivity)     Status: None   Collection Time: 04/12/19  9:35 PM  Result Value Ref Range   Troponin I (High Sensitivity) 14 <18 ng/L  POC SARS Coronavirus 2 Ag     Status: None   Collection Time: 04/12/19  9:42 PM  Result Value Ref Range   SARS Coronavirus 2 Ag NEGATIVE NEGATIVE  Glucose, capillary     Status: Abnormal   Collection Time: 04/13/19 12:57 AM  Result Value Ref Range   Glucose-Capillary  116  (H) 70 - 99 mg/dL  Procalcitonin     Status: None   Collection Time: 04/13/19  5:08 AM  Result Value Ref Range   Procalcitonin <0.10 ng/mL  CBC     Status: Abnormal   Collection Time: 04/13/19  5:08 AM  Result Value Ref Range   WBC 6.2 4.0 - 10.5 K/uL   RBC 3.29 (L) 3.87 - 5.11 MIL/uL   Hemoglobin 9.4 (L) 12.0 - 15.0 g/dL   HCT 28.9 (L) 36.0 - 46.0 %   MCV 87.8 80.0 - 100.0 fL   MCH 28.6 26.0 - 34.0 pg   MCHC 32.5 30.0 - 36.0 g/dL   RDW 13.2 11.5 - 15.5 %   Platelets 192 150 - 400 K/uL   nRBC 0.0 0.0 - 0.2 %  Renal function panel     Status: Abnormal   Collection Time: 04/13/19  5:08 AM  Result Value Ref Range   Sodium 140 135 - 145 mmol/L   Potassium 3.8 3.5 - 5.1 mmol/L   Chloride 101 98 - 111 mmol/L   CO2 27 22 - 32 mmol/L   Glucose, Bld 102 (H) 70 - 99 mg/dL   BUN 20 8 - 23 mg/dL   Creatinine, Ser 1.29 (H) 0.44 - 1.00 mg/dL   Calcium 9.3 8.9 - 10.3 mg/dL   Phosphorus 2.4 (L) 2.5 - 4.6 mg/dL   Albumin 3.0 (L) 3.5 - 5.0 g/dL   GFR calc non Af Amer 38 (L) >60 mL/min   GFR calc Af Amer 44 (L) >60 mL/min   Anion gap 12 5 - 15  Glucose, capillary     Status: Abnormal   Collection Time: 04/13/19  7:32 AM  Result Value Ref Range   Glucose-Capillary 102 (H) 70 - 99 mg/dL  Glucose, capillary     Status: Abnormal   Collection Time: 04/13/19 11:32 AM  Result Value Ref Range   Glucose-Capillary 105 (H) 70 - 99 mg/dL    Studies/Results: DG Chest 1 View  Result Date: 04/12/2019 CLINICAL DATA:  Weakness. Possible fall. EXAM: CHEST  1 VIEW COMPARISON:  Radiograph 02/10/2019 FINDINGS: The cardiomediastinal contours are normal. Aortic atherosclerosis. Pulmonary vasculature is normal. No consolidation, pleural effusion, or pneumothorax. No acute osseous abnormalities are seen. The bones are under mineralized. No acute osseous abnormalities are seen. IMPRESSION: No acute abnormality. Electronically Signed   By: Keith Rake M.D.   On: 04/12/2019 20:58   CT Head Wo  Contrast  Result Date: 04/12/2019 CLINICAL DATA:  Fall.  Weakness.  Dementia.  Headache.  Hypotension. EXAM: CT HEAD WITHOUT CONTRAST CT CERVICAL SPINE WITHOUT CONTRAST TECHNIQUE: Multidetector CT imaging of the head and cervical spine was performed following the standard protocol without intravenous contrast. Multiplanar CT image reconstructions of the cervical spine were also generated. COMPARISON:  03/30/2019 FINDINGS: CT HEAD FINDINGS Brain: Advanced generalized brain atrophy. Chronic small-vessel ischemic changes affecting the pons, thalami, basal ganglia and cerebral hemispheric white matter. No cortical or large vessel territory infarction. No mass lesion, hemorrhage, hydrocephalus or extra-axial collection. Vascular: There is atherosclerotic calcification of the major vessels at the base of the brain. Skull: No skull fracture. Sinuses/Orbits: Clear/normal Other: None CT CERVICAL SPINE FINDINGS Alignment: Normal Skull base and vertebrae: No acute fracture. Healing minor superior endplate fracture at T1, which was acute in October of last year. Soft tissues and spinal canal: Normal Disc levels: Ordinary osteoarthritis at the C1-2 articulation. Normal disc spaces from C2-3 through C5-6. Disc space narrowing at C6-7 with small  osteophytes but no significant stenosis. Upper chest: Negative Other: None IMPRESSION: Head CT: No acute finding. Advanced atrophy and chronic small-vessel ischemic changes as above. Cervical spine CT: No acute finding. Healing superior endplate fracture at T1 without further collapse. Electronically Signed   By: Paulina FusiMark  Shogry M.D.   On: 04/12/2019 20:18   CT Head Wo Contrast  Result Date: 03/30/2019 CLINICAL DATA:  Headaches following recent fall, initial encounter EXAM: CT HEAD WITHOUT CONTRAST TECHNIQUE: Contiguous axial images were obtained from the base of the skull through the vertex without intravenous contrast. COMPARISON:  01/20/2019 FINDINGS: Brain: Chronic atrophic and  ischemic changes are identified. No findings to suggest acute hemorrhage, acute infarction or space-occupying mass lesion are noted. Scattered thalamic lacunar infarcts are seen. Vascular: No hyperdense vessel or unexpected calcification. Skull: Normal. Negative for fracture or focal lesion. Sinuses/Orbits: No acute finding. Other: None. IMPRESSION: Chronic atrophic and ischemic changes without acute abnormality. Electronically Signed   By: Alcide CleverMark  Lukens M.D.   On: 03/30/2019 14:33   CT Cervical Spine Wo Contrast  Result Date: 04/12/2019 CLINICAL DATA:  Fall.  Weakness.  Dementia.  Headache.  Hypotension. EXAM: CT HEAD WITHOUT CONTRAST CT CERVICAL SPINE WITHOUT CONTRAST TECHNIQUE: Multidetector CT imaging of the head and cervical spine was performed following the standard protocol without intravenous contrast. Multiplanar CT image reconstructions of the cervical spine were also generated. COMPARISON:  03/30/2019 FINDINGS: CT HEAD FINDINGS Brain: Advanced generalized brain atrophy. Chronic small-vessel ischemic changes affecting the pons, thalami, basal ganglia and cerebral hemispheric white matter. No cortical or large vessel territory infarction. No mass lesion, hemorrhage, hydrocephalus or extra-axial collection. Vascular: There is atherosclerotic calcification of the major vessels at the base of the brain. Skull: No skull fracture. Sinuses/Orbits: Clear/normal Other: None CT CERVICAL SPINE FINDINGS Alignment: Normal Skull base and vertebrae: No acute fracture. Healing minor superior endplate fracture at T1, which was acute in October of last year. Soft tissues and spinal canal: Normal Disc levels: Ordinary osteoarthritis at the C1-2 articulation. Normal disc spaces from C2-3 through C5-6. Disc space narrowing at C6-7 with small osteophytes but no significant stenosis. Upper chest: Negative Other: None IMPRESSION: Head CT: No acute finding. Advanced atrophy and chronic small-vessel ischemic changes as above.  Cervical spine CT: No acute finding. Healing superior endplate fracture at T1 without further collapse. Electronically Signed   By: Paulina FusiMark  Shogry M.D.   On: 04/12/2019 20:18    Scheduled Meds: . apixaban  2.5 mg Oral BID  . insulin aspart  0-5 Units Subcutaneous QHS  . insulin aspart  0-9 Units Subcutaneous TID WC   Continuous Infusions: . cefTRIAXone (ROCEPHIN)  IV     PRN Meds:ondansetron **OR** ondansetron (ZOFRAN) IV  Assessment/Plan: #1 UTI:  Suspected admission  -Continue Rocephin - urinalysis not very impressive except for positive nitrites. -  Await urine culture sensitivities as well as blood cultures.  Monitor patient's response. -Courage p.o. hydration  #2 acute metabolic encephalopathy: Waxing and waning  In the setting of known dementia.  Treat UTI.  Observe in the hospital.  -Let us post gentle hydration on admission ; and has a history of CHF  #3 CHF: Appears to be compensated -  Patient has history of both systolic and diastolic CHF.  We will be careful with any fluids at this point.    #4 diabetes:  Stable range  - sliding scale insulin.  #5 history of PE: Patient is on chronic anticoagulation.  Continue 7 home dose  #6 dementia: Suspected vascular dementia.  At this point she is stable.  Continue treatment.  #7 hypotension:  On admission blood pressure is now normalized.  -  Most likely dehydration.  Continue to monitor  #8 acute kidney injury:  Slowly improving as creatinine improved to 1.29 today  - BUN/creatinine is slightly elevated.  Monitor while in the hospital. -Encourage p.o. hydration  Diet: Very poor at the moment -We will have nurses to do bedside swallow eval.  If fails we will have to keep patient n.p.o. -Passes we will continue diet -As per charge nurse, speech therapist are not available on weekend  Family Communication: Called daughter, Elvin So. Updated today. Answered all questions. Expressed understanding. At baseline Pt is alert  and recognizes  DVT prophylaxis: Apixaban   LOS: 1 day   Kailynne Ferrington Harold Hedge

## 2019-04-13 NOTE — Evaluation (Signed)
Clinical/Bedside Swallow Evaluation Jasmine Hubbard Details  Name: Jasmine Hubbard MRN: [id number] Date of Birth: 1932-01-08  Today's Date: 04/13/2019 Time: SLP Start Time (ACUTE ONLY): 1118 SLP Stop Time (ACUTE ONLY): 1205 SLP Time Calculation (min) (ACUTE ONLY): 47 min  Past Medical History:  Past Medical History:  Diagnosis Date  . CHF (congestive heart failure) (HCC)   . Diabetes mellitus without complication (HCC)   . Left ventricular failure (HCC)   . Osteoporosis   . Pulmonary embolism (HCC)   . Pulmonary embolus (HCC)   . Vascular dementia [facility])    Past Surgical History:  Past Surgical History:  Procedure Laterality Date  . ABDOMINAL HYSTERECTOMY    . BACK SURGERY    . FRACTURE SURGERY     elbow   HPI:  Jasmine Hubbard is a 86 y.o. female with medical history significant of Alzheimer's and vascular Dementia, diabetes, hypertension, history of PE on chronic anticoagulation, diastolic dysfunction CHF, systolic dysfunction who was brought in from skilled facility with generalized weakness, hypotension, dizziness and headache.  EMS called and Jasmine Hubbard was brought to the ER.  In the ER Jasmine Hubbard was noted to have lactic acidosis.  Also evidence of UTI and worsening confusion.  Jasmine Hubbard being admitted with acute metabolic encephalopathy from UTI.   Assessment / Plan / Recommendation Clinical Impression  Jasmine Hubbard appears to present w/ adequate oropharyngeal phase swallow function w/ No overt clinical s/s of aspiration noted during oral intake; decreased risk for aspiration w/ general aspiration precautions. However, Jasmine Hubbard does exhibit Min oral phase challenges w/ mastication of increased texture d/t missing Dentition; Jasmine Hubbard also has a baseline of Dementia/Cognitive decline. Jasmine Hubbard required assistance and encouragement sitting up for oral intake; po trials. OM exam was [facility]; speech clear. Jasmine Hubbard fed trials following general aspiration precautions as instructed including taking small, single sips/bites and clearing mouth  b/t bites. No overt coughing noted, no decline vocal quality or respiratory status w/ the thin liquids and purees/softened solids. Oral phase appeared grossly [facility] for bolus management, mastication, A-P transfer, and oral clearing -- though min increased mastication time/effort needed w/ increased texture. Suspect lacking sufficient Dentition is impacting. Encouraged Jasmine Hubbard to clear mouth well b/t bites; alternated foods/liquids. Recommend a Minced diet consistency w/ gravy added to moisten; thin liquids. Recommend general aspiration precautions; tray setup and support feeding Jasmine Hubbard at meals. Recommend Pills in Puree(crushed as needed) for easier swallowing. MD/NSG updated. Daughter educated on evaluation and recommendations. Precautions posted. ST services can be available for any further needs while admitted if needed.  SLP Visit Diagnosis: Dysphagia, oral phase (R13.11)(missing some Dentition; baseline Dementia)    Aspiration Risk  Mild aspiration risk;Risk for inadequate nutrition/hydration(but Reduced following general precs)    Diet Recommendation  Minced diet consistency w/ gravies added to moisten well; Thin liquids. General aspiration precautions; Support at meals for tray setup and feeding as needed.   Medication Administration: Crushed with puree(as needed for safer swallowing d/t Cognitive decline)    Other  Recommendations Recommended Consults: (Dietician f/u) Oral Care Recommendations: Oral care BID;Oral care before and after PO;Staff/trained caregiver to provide oral care Other Recommendations: (n/a)   Follow up Recommendations None      Frequency and Duration (n/a)  (n/a)       Prognosis Prognosis for Safe Diet Advancement: Fair(-Good) Barriers to Reach Goals: Cognitive deficits;Time post onset;Severity of deficits      Swallow Study   General Date of Onset: 04/12/19 HPI: Jasmine Hubbard is a 86 y.o. female with medical history significant of Alzheimer's  and vascular Dementia, diabetes,  hypertension, history of PE on chronic anticoagulation, diastolic dysfunction CHF, systolic dysfunction who was brought in from skilled facility with generalized weakness, hypotension, dizziness and headache.  EMS called and Jasmine Hubbard was brought to the ER.  In the ER Jasmine Hubbard was noted to have lactic acidosis.  Also evidence of UTI and worsening confusion.  Jasmine Hubbard being admitted with acute metabolic encephalopathy from UTI. Type of Study: Bedside Swallow Evaluation Previous Swallow Assessment: none reported Diet Prior to this Study: Regular;Thin liquids Temperature Spikes Noted: No(wbc 6.2) Respiratory Status: Room air History of Recent Intubation: No Behavior/Cognition: Alert;Cooperative;Pleasant mood;Confused;Distractible;Requires cueing Oral Cavity Assessment: Within Functional Limits;Dry(min) Oral Care Completed by SLP: Yes Oral Cavity - Dentition: Adequate natural dentition;Missing dentition Vision: Functional for self-feeding Self-Feeding Abilities: Able to feed self;Needs set up;Total assist Jasmine Hubbard Positioning: Upright in bed(needed support to sit forward) Baseline Vocal Quality: Normal;Low vocal intensity Volitional Cough: Strong Volitional Swallow: Able to elicit    Oral/Motor/Sensory Function Overall Oral Motor/Sensory Function: Within functional limits(w/ bolus management, speech clear)   Ice Chips Ice chips: Within functional limits Presentation: Spoon(fed; 2 trials)   Thin Liquid Thin Liquid: Within functional limits Presentation: Cup;Self Fed;Straw(5 trials via each)    Nectar Thick Nectar Thick Liquid: Not tested   Honey Thick Honey Thick Liquid: Not tested   Puree Puree: Within functional limits Presentation: Spoon(fed; ~3 ozs)   Solid     Solid: Impaired Presentation: Spoon(fed; 3 trials) Oral Phase Impairments: Impaired mastication(min increased effort/time) Oral Phase Functional Implications: Impaired mastication;Prolonged oral transit Pharyngeal Phase  Impairments: (none) Other Comments: missing some native Dentition       Orinda Kenner, MS, CCC-SLP Blease Capaldi 04/13/2019,12:35 PM

## 2019-04-14 LAB — BASIC METABOLIC PANEL
Anion gap: 8 (ref 5–15)
BUN: 18 mg/dL (ref 8–23)
CO2: 27 mmol/L (ref 22–32)
Calcium: 8.8 mg/dL — ABNORMAL LOW (ref 8.9–10.3)
Chloride: 103 mmol/L (ref 98–111)
Creatinine, Ser: 1.3 mg/dL — ABNORMAL HIGH (ref 0.44–1.00)
GFR calc Af Amer: 43 mL/min — ABNORMAL LOW (ref >60)
GFR calc non Af Amer: 37 mL/min — ABNORMAL LOW (ref >60)
Glucose, Bld: 103 mg/dL — ABNORMAL HIGH (ref 70–99)
Potassium: 4 mmol/L (ref 3.5–5.1)
Sodium: 138 mmol/L (ref 135–145)

## 2019-04-14 LAB — GLUCOSE, CAPILLARY
Glucose-Capillary: 109 mg/dL — ABNORMAL HIGH (ref 70–99)
Glucose-Capillary: 108 mg/dL — ABNORMAL HIGH (ref 70–99)
Glucose-Capillary: 107 mg/dL — ABNORMAL HIGH (ref 70–99)
Glucose-Capillary: 104 mg/dL — ABNORMAL HIGH (ref 70–99)

## 2019-04-14 LAB — LACTIC ACID, PLASMA
Lactic Acid, Venous: 1.1 mmol/L (ref 0.5–1.9)
Lactic Acid, Venous: 0.8 mmol/L (ref 0.5–1.9)

## 2019-04-14 LAB — PROCALCITONIN: Procalcitonin: <0.1 ng/mL

## 2019-04-14 MED ORDER — SODIUM CHLORIDE 0.9 % IV BOLUS
250.0000 mL | Freq: Once | INTRAVENOUS | Status: AC
  Administered 2019-04-14: 250 mL via INTRAVENOUS

## 2019-04-14 MED ORDER — SODIUM CHLORIDE 0.9 % IV SOLN: INTRAVENOUS | Status: DC

## 2019-04-14 MED ORDER — DEXTROSE-NACL 5-0.9 % IV SOLN: INTRAVENOUS | Status: DC

## 2019-04-14 NOTE — Progress Notes (Signed)
Subjective: Patient was still somewhat sleepy and reluctant to speak this morning.  Did not eat much.  Objective: Vital signs in last 24 hours: Temp:  [98.4 F (36.9 C)-100.3 F (37.9 C)] 98.5 F (36.9 C) (01/17 1100) Pulse Rate:  [69-97] 69 (01/17 1100) Resp:  [12-14] 14 (01/17 1100) BP: (84-94)/(51-72) 89/62 (01/17 1100) SpO2:  [93 %-96 %] 96 % (01/17 1100)  Intake/Output from previous day: 01/16 0701 - 01/17 0700 In: 119 [P.O.:18] Out: 750 [Urine:750] Intake/Output this shift: No intake/output data recorded.  ENMT: Mucous membranes are moist. Posterior pharynx clear of any exudate or lesions.Normal dentition.  Neck: normal, supple, no masses, no thyromegaly Respiratory: clear to auscultation bilaterally, no wheezing, no crackles. Normal respiratory effort. No accessory muscle use.  Cardiovascular:  Regular, rate and rhythm; no murmurs / rubs / gallops. No extremity edema. 2+ pedal pulses. No carotid bruits.  Abdomen: no tenderness, no masses palpated. No hepatosplenomegaly. Bowel sounds positive.  Musculoskeletal: no clubbing / cyanosis. No joint deformity upper and lower extremities. Good ROM, no contractures. Normal muscle tone.  Skin: no rashes, lesions, ulcers. No induration Neurologic: CN 2-12 grossly intact. Sensation intact, DTR normal. Strength 5/5 in all 4.  Psychiatric:  Lethargic, disoriented in place and time  Results for orders placed or performed during the hospital encounter of 04/12/19 (from the past 24 hour(s))  Glucose, capillary     Status: None   Collection Time: 04/13/19  4:41 PM  Result Value Ref Range   Glucose-Capillary 90 70 - 99 mg/dL  Glucose, capillary     Status: Abnormal   Collection Time: 04/13/19  9:51 PM  Result Value Ref Range   Glucose-Capillary 108 (H) 70 - 99 mg/dL   Comment 1 Notify RN   Procalcitonin     Status: None   Collection Time: 04/14/19  4:57 AM  Result Value Ref Range   Procalcitonin <0.10 ng/mL  Basic metabolic panel      Status: Abnormal   Collection Time: 04/14/19  4:57 AM  Result Value Ref Range   Sodium 138 135 - 145 mmol/L   Potassium 4.0 3.5 - 5.1 mmol/L   Chloride 103 98 - 111 mmol/L   CO2 27 22 - 32 mmol/L   Glucose, Bld 103 (H) 70 - 99 mg/dL   BUN 18 8 - 23 mg/dL   Creatinine, Ser 3.66 (H) 0.44 - 1.00 mg/dL   Calcium 8.8 (L) 8.9 - 10.3 mg/dL   GFR calc non Af Amer 37 (L) >60 mL/min   GFR calc Af Amer 43 (L) >60 mL/min   Anion gap 8 5 - 15  Glucose, capillary     Status: Abnormal   Collection Time: 04/14/19  7:45 AM  Result Value Ref Range   Glucose-Capillary 109 (H) 70 - 99 mg/dL  Glucose, capillary     Status: Abnormal   Collection Time: 04/14/19 12:16 PM  Result Value Ref Range   Glucose-Capillary 108 (H) 70 - 99 mg/dL    Studies/Results: DG Chest 1 View  Result Date: 04/12/2019 CLINICAL DATA:  Weakness. Possible fall. EXAM: CHEST  1 VIEW COMPARISON:  Radiograph 02/10/2019 FINDINGS: The cardiomediastinal contours are normal. Aortic atherosclerosis. Pulmonary vasculature is normal. No consolidation, pleural effusion, or pneumothorax. No acute osseous abnormalities are seen. The bones are under mineralized. No acute osseous abnormalities are seen. IMPRESSION: No acute abnormality. Electronically Signed   By: Narda Rutherford M.D.   On: 04/12/2019 20:58   CT Head Wo Contrast  Result Date: 04/12/2019 CLINICAL  DATA:  Fall.  Weakness.  Dementia.  Headache.  Hypotension. EXAM: CT HEAD WITHOUT CONTRAST CT CERVICAL SPINE WITHOUT CONTRAST TECHNIQUE: Multidetector CT imaging of the head and cervical spine was performed following the standard protocol without intravenous contrast. Multiplanar CT image reconstructions of the cervical spine were also generated. COMPARISON:  03/30/2019 FINDINGS: CT HEAD FINDINGS Brain: Advanced generalized brain atrophy. Chronic small-vessel ischemic changes affecting the pons, thalami, basal ganglia and cerebral hemispheric white matter. No cortical or large vessel  territory infarction. No mass lesion, hemorrhage, hydrocephalus or extra-axial collection. Vascular: There is atherosclerotic calcification of the major vessels at the base of the brain. Skull: No skull fracture. Sinuses/Orbits: Clear/normal Other: None CT CERVICAL SPINE FINDINGS Alignment: Normal Skull base and vertebrae: No acute fracture. Healing minor superior endplate fracture at T1, which was acute in October of last year. Soft tissues and spinal canal: Normal Disc levels: Ordinary osteoarthritis at the C1-2 articulation. Normal disc spaces from C2-3 through C5-6. Disc space narrowing at C6-7 with small osteophytes but no significant stenosis. Upper chest: Negative Other: None IMPRESSION: Head CT: No acute finding. Advanced atrophy and chronic small-vessel ischemic changes as above. Cervical spine CT: No acute finding. Healing superior endplate fracture at T1 without further collapse. Electronically Signed   By: Paulina Fusi M.D.   On: 04/12/2019 20:18   CT Head Wo Contrast  Result Date: 03/30/2019 CLINICAL DATA:  Headaches following recent fall, initial encounter EXAM: CT HEAD WITHOUT CONTRAST TECHNIQUE: Contiguous axial images were obtained from the base of the skull through the vertex without intravenous contrast. COMPARISON:  01/20/2019 FINDINGS: Brain: Chronic atrophic and ischemic changes are identified. No findings to suggest acute hemorrhage, acute infarction or space-occupying mass lesion are noted. Scattered thalamic lacunar infarcts are seen. Vascular: No hyperdense vessel or unexpected calcification. Skull: Normal. Negative for fracture or focal lesion. Sinuses/Orbits: No acute finding. Other: None. IMPRESSION: Chronic atrophic and ischemic changes without acute abnormality. Electronically Signed   By: Alcide Clever M.D.   On: 03/30/2019 14:33   CT Cervical Spine Wo Contrast  Result Date: 04/12/2019 CLINICAL DATA:  Fall.  Weakness.  Dementia.  Headache.  Hypotension. EXAM: CT HEAD WITHOUT  CONTRAST CT CERVICAL SPINE WITHOUT CONTRAST TECHNIQUE: Multidetector CT imaging of the head and cervical spine was performed following the standard protocol without intravenous contrast. Multiplanar CT image reconstructions of the cervical spine were also generated. COMPARISON:  03/30/2019 FINDINGS: CT HEAD FINDINGS Brain: Advanced generalized brain atrophy. Chronic small-vessel ischemic changes affecting the pons, thalami, basal ganglia and cerebral hemispheric white matter. No cortical or large vessel territory infarction. No mass lesion, hemorrhage, hydrocephalus or extra-axial collection. Vascular: There is atherosclerotic calcification of the major vessels at the base of the brain. Skull: No skull fracture. Sinuses/Orbits: Clear/normal Other: None CT CERVICAL SPINE FINDINGS Alignment: Normal Skull base and vertebrae: No acute fracture. Healing minor superior endplate fracture at T1, which was acute in October of last year. Soft tissues and spinal canal: Normal Disc levels: Ordinary osteoarthritis at the C1-2 articulation. Normal disc spaces from C2-3 through C5-6. Disc space narrowing at C6-7 with small osteophytes but no significant stenosis. Upper chest: Negative Other: None IMPRESSION: Head CT: No acute finding. Advanced atrophy and chronic small-vessel ischemic changes as above. Cervical spine CT: No acute finding. Healing superior endplate fracture at T1 without further collapse. Electronically Signed   By: Paulina Fusi M.D.   On: 04/12/2019 20:18    Scheduled Meds: . apixaban  2.5 mg Oral BID  . insulin aspart  0-5 Units Subcutaneous QHS  . insulin aspart  0-9 Units Subcutaneous TID WC   Continuous Infusions: . cefTRIAXone (ROCEPHIN)  IV Stopped (04/14/19 0110)   PRN Meds:ondansetron **OR** ondansetron (ZOFRAN) IV  Assessment/Plan: #1 UTI:   -Continue empiric treatment with Rocephin; started on admission -Urine culture from 04/12/2019 shows more than 100 K of GNR; ID and susceptibility to  follow -Blood culture from 04/12/2019 shows no growth so far -Encourage p.o. hydration; if remains poor PO intake, may have to start iv hydration   #2 acute metabolic encephalopathy: Waxing and waning  In the setting of known dementia.  Treat UTI.  Observe in the hospital.  -Status post IV minimal hydration on admission; and has a history of CHF  #3 CHF: History of no acute issue  - appears to be dry somewhat -  Patient has history of both systolic and diastolic CHF.  We will be careful with any fluids at this point.   -Do not have an echo in our record -Chest x-ray shows no pulmonary edema or effusion -No edema or swelling of the extremities  #4 diabetes:  Stable range  - sliding scale insulin.  #5 history of PE: Patient is on chronic anticoagulation.    Continue home med  #6 dementia: Suspected vascular dementia.  -Appears to be having waxing and waning mental status -Spoke with daughter in great detail who mention patient has some good and bad days  #7 hypotension:  Fluctuating and currently hypotensive since this morning -  Most likely dehydration -We will give IV gentle hydration since patient has not been eating or drinking much patient is likely to be very dehydrated -Patient's urine output has been about 600 cc from last morning -Monitor strict ins and outs; she has an external catheter  #8 acute kidney injury:  Slowly improving as creatinine improved to 1.3 today  - BUN/creatinine is slightly elevated.  Monitor while in the hospital. -Encourage p.o. hydration  Diet: Very poor at the moment -Patient passed bedside swallow eval by the speech therapist -However patient has been very reluctant to eat -Consult nutritionist -May start gentle rate of D5 NS  Family Communication: Called daughter, [name]. Updated today. Answered all questions. Expressed understanding. At baseline Pt is alert and recognizes  DVT prophylaxis: Apixaban   LOS: 2 days   Breydan [place] Izetta Dakin

## 2019-04-14 NOTE — Progress Notes (Signed)
PT Cancellation Note  Patient Details Name: Jasmine Hubbard MRN: [id number] DOB: 1931-09-05   Cancelled Treatment:    Reason Eval/Treat Not Completed: Other (comment) .  Pt was seen for attempted eval but too sleepy.  Pt was a resident of ALF [facility].   Daughter Toniann Fail is in, reports pt previously needed help to transfer from the bed on RW but then was mod I to walk with RW.  Required help to dress and bathe but could feed herself.  Will try again as time and pt allow.  Ivar Drape 04/14/2019, 2:55 PM   Samul Dada, PT MS Acute Rehab Dept. Number: [facility] [id number] and [facility] [phone]

## 2019-04-15 LAB — GLUCOSE, CAPILLARY
Glucose-Capillary: 110 mg/dL — ABNORMAL HIGH (ref 70–99)
Glucose-Capillary: 137 mg/dL — ABNORMAL HIGH (ref 70–99)
Glucose-Capillary: 150 mg/dL — ABNORMAL HIGH (ref 70–99)
Glucose-Capillary: 178 mg/dL — ABNORMAL HIGH (ref 70–99)

## 2019-04-15 LAB — URINE CULTURE: Culture: >=100000 — AB

## 2019-04-15 MED ORDER — INSULIN ASPART 100 UNIT/ML ~~LOC~~ SOLN: 0.0000 [IU] | Freq: Three times a day (TID) | SUBCUTANEOUS | 11 refills | Status: DC

## 2019-04-15 MED ORDER — INSULIN ASPART 100 UNIT/ML ~~LOC~~ SOLN: 0.0000 [IU] | Freq: Every day | SUBCUTANEOUS | 11 refills | Status: DC

## 2019-04-15 MED ORDER — PROBIOTIC 250 MG PO CAPS: 1.0000 | ORAL_CAPSULE | Freq: Two times a day (BID) | ORAL | 0 refills | Status: DC

## 2019-04-15 MED ORDER — CIPROFLOXACIN 500 MG/5ML (10%) PO SUSR
500.0000 mg | Freq: Two times a day (BID) | ORAL | Status: DC
  Administered 2019-04-15 – 2019-04-16 (×3): 500 mg via ORAL
  Filled 2019-04-15 (×5): qty 5

## 2019-04-15 MED ORDER — CIPROFLOXACIN 500 MG/5ML (10%) PO SUSR: 500.0000 mg | Freq: Two times a day (BID) | ORAL | 0 refills | Status: DC

## 2019-04-15 NOTE — Progress Notes (Signed)
   04/15/19 1400  Clinical Encounter Type  Visited With Patient  Visit Type Initial  Referral From Chaplain  Consult/Referral To Chaplain  Chaplain visited with patient while rounding. Patient was finishing her lunch. Chaplain offered pastoral presence and empathy.

## 2019-04-15 NOTE — Care Management Important Message (Signed)
Important Message  Patient Details  Name: Jasmine Hubbard MRN: [id number] Date of Birth: Aug 19, 1931   Medicare Important Message Given:  Yes     [name] 04/15/2019, 11:20 AM

## 2019-04-15 NOTE — Discharge Summary (Addendum)
Physician Discharge Summary  Addendum from 04/16/2019: Patient was supposed to be discharged on 04/15/2019 to skilled nursing facility pending approval.  Patient ended up staying overnight.  Expected discharge to ALF today with home health PT OT, 04/16/2019.  Remained stable and care remains same.  Patient ID: Jasmine Hubbard MRN: [id number] DOB/AGE: 1931-06-18 86 y.o.  Admit date: 04/12/2019 Discharge date: 04/16/2019  Admission Diagnoses:  Discharge Diagnoses:  Principal Problem:   UTI (urinary tract infection) Active Problems:   Dementia ([place])   Acute metabolic encephalopathy   Diabetes ([place])   Chronic diastolic CHF (congestive heart failure) ([place])   Discharged Condition: fair  Hospital Course:   HPI on admission: Jasmine Hubbard is a 86 y.o. female with medical history significant of Alzheimer's and vascular dementia, diabetes, hypertension, history of PE on chronic anticoagulation, diastolic dysfunction CHF, systolic dysfunction who was brought in from skilled facility with generalized weakness, hypotension, dizziness and headache.  Systolic was 92 at the facility.  She had a temperature of 97.   Also evidence of UTI and worsening confusion.  In the ED,  Urinalysis is positive for nitrites, no leukocytes but bacteria.  Chest x-ray is negative.  Head CT and CT neck are all negative.  Patient is being admitted with metabolic encephalopathy most likely secondary to UTI.  Urine and blood cultures have been obtained.   #1 UTI:  -Started treatment with Rocephin;  changed to ciprofloxacin twice daily for 4 more days -Urine culture from 04/12/2019 grew Citrobacter and Klebsiella.  Antibiotic based on ID and susceptibility -Blood culture from 04/12/2019 shows no growth so far -Mental status improved and her p.o. intake also improved  #2 acute metabolic encephalopathy: Much improved as UTI treatment continued In the setting of known dementia.   -Status post IV minimal  hydration on admission; and has a history of CHF -Likely to be in her baseline -Tinea supportive treatment -Avoid benzos  #3 CHF: History of no acute issue  -Chest x-ray shows no pulmonary edema or effusion -No edema or swelling of the extremities  #4 diabetes: Stable range  -Discontinue Metformin since patient has chronic kidney disease.  This time sent back to the facility with sliding scale insulin and mealtime insulin.  May need to be adjusted based on blood glucose and per facility MD  #5 history of OE:[id number] is on chronic anticoagulation.   Continue   #6 dementia:Suspected vascular dementia and other combination of issues  -Appears to be having waxing and waning mental status -Spoke with daughter in great detail who mention patient has some good and bad days  #7 hypotension:  Improved sleep with low normal range - Most likely dehydration -Status post IV gentle hydration since patient has not been eating or drinking much patient is likely to be very dehydrated -Encourage p.o. hydration -Monitor strict ins and outs; she has an external catheter  #8 acute kidney injury:Most likely acute on chronic kidney disease  - slowly improving as creatinine improved to 1.3; may be her new baseline -  Encourage p.o. hydration -Continued home Metformin  Diet:  Improved -Since admission initially was very poor oral intake.  As her mental status improved with treatment for infection, patient's p.o. intake also improved.  Continue to encourage increased p.o. intake  -Need assistance from aide  Family Communication: Called daughter, Jasmine Hubbard. Updated. Answered all questions. Expressed understanding.    Consults: None  Significant Diagnostic Studies: Lab work and blood test  Treatments: As per hospital course and discharge  med list  Discharge Exam: Blood pressure (!) 99/58, pulse 64, temperature 98.6 F (37 C), temperature source Oral, resp. rate 20, height 5\' 2"  (1.575 m),  weight 58.1 kg, SpO2 100 %.  ENMT:Mucous membranes are moist. Posterior pharynx clear of any exudate or lesions.Normal dentition.  Neck:normal, supple, no masses, no thyromegaly Respiratory:clear to auscultation bilaterally, no wheezing, no crackles. Normal respiratory effort. No accessory muscle use.  Cardiovascular: Regular, rate and rhythm; no murmurs / rubs / gallops. No extremity edema. 2+ pedal pulses. No carotid bruits.  Abdomen:no tenderness, no masses palpated. No hepatosplenomegaly. Bowel sounds positive.  Musculoskeletal:no clubbing / cyanosis. No joint deformity upper and lower extremities. Good ROM, no contractures. Normal muscle tone.  Skin:no rashes, lesions, ulcers. No induration Neurologic:CN 2-12 grossly intact. Sensation intact, DTR normal. Strength 5/5 in all 4.  Psychiatric:Alert.  Not oriented to place and time.  Knows her own name and daughter's name.  Discharge disposition: Discharged to assisted living facility with home health care PT and OT  Stable to be discharged back to patient's ALF  where she came from.  Follow-up with facility MD/PCP in 1 week. -Notified case manager who is wanting working on discharging patient back to the facility.  Discharge Instructions    Call MD for:  difficulty breathing, headache or visual disturbances   Complete by: As directed    Call MD for:  temperature >100.4   Complete by: As directed    Diet Carb Modified   Complete by: As directed    Per speech therapy   Walk with assistance   Complete by: As directed    Per physical therapy and Occupational Therapy     Allergies as of 04/16/2019      Reactions   Naproxen Hives, Shortness Of Breath      Medication List    STOP taking these medications   hydrOXYzine 25 MG tablet Commonly known as: ATARAX/VISTARIL   loperamide 2 MG capsule Commonly known as: IMODIUM   metFORMIN 500 MG tablet Commonly known as: GLUCOPHAGE     TAKE these medications   acetaminophen  325 MG tablet Commonly known as: TYLENOL Take 650 mg by mouth every 6 (six) hours as needed for mild pain.   alendronate 70 MG tablet Commonly known as: FOSAMAX Take 70 mg by mouth every Friday. Take with a full glass of water on an empty stomach.   Calcium Carb-Cholecalciferol 600-400 MG-UNIT Tabs Take 1 tablet by mouth 2 (two) times a day.   Calcium Citrate-Vitamin D 250-200 MG-UNIT Tabs Take 2 tablets by mouth at bedtime.   ciprofloxacin 500 MG/5ML (10%) suspension Commonly known as: CIPRO Take 5 mLs (500 mg total) by mouth 2 (two) times daily for 4 days.   citalopram 10 MG tablet Commonly known as: CELEXA Take 10 mg by mouth daily.   Eliquis 2.5 MG Tabs tablet Generic drug: apixaban Take 2.5 mg by mouth 2 (two) times daily.   furosemide 20 MG tablet Commonly known as: LASIX Take 20 mg by mouth daily.   guaifenesin 100 MG/5ML syrup Commonly known as: ROBITUSSIN Take 200 mg by mouth every 6 (six) hours as needed for cough or congestion. (max 4 doses daily)   insulin aspart 100 UNIT/ML injection Commonly known as: novoLOG Inject 0-9 Units into the skin 3 (three) times daily with meals.   insulin aspart 100 UNIT/ML injection Commonly known as: novoLOG Inject 0-5 Units into the skin at bedtime.   lovastatin 20 MG tablet Commonly known as: MEVACOR Take  40 mg by mouth at bedtime.   magnesium hydroxide 400 MG/5ML suspension Commonly known as: MILK OF MAGNESIA Take 30 mLs by mouth daily as needed for mild constipation.   Mintox 200-200-20 MG/5ML suspension Generic drug: alum & mag hydroxide-simeth Take 30 mLs by mouth every 6 (six) hours as needed for indigestion or heartburn.   neomycin-bacitracin-polymyxin Oint Commonly known as: NEOSPORIN Apply 1 application topically 4 (four) times daily as needed for wound care.   Probiotic 250 MG Caps Take 1 each by mouth 2 (two) times daily.   sodium chloride 0.65 % Soln nasal spray Commonly known as: OCEAN Place 1  spray into both nostrils 3 (three) times daily.            Durable Medical Equipment  (From admission, onward)         Start     Ordered   04/15/19 1430  For home use only DME Bedside commode  Once    Comments: 3 and 1 elevated commode, bedside  Question Answer Comment  Patient needs a bedside commode to treat with the following condition Generalized weakness   Patient needs a bedside commode to treat with the following condition Dementia ([facility])      04/15/19 1429           Signed: Thomasenia Bottoms 04/16/2019, 12:46 PM

## 2019-04-15 NOTE — Progress Notes (Signed)
Subjective: Much improved mental status. Alert and oriented to person. Was having breakfast.   Objective: Vital signs in last 24 hours: Temp:  [97.7 F (36.5 C)-97.9 F (36.6 C)] 97.7 F (36.5 C) (01/18 1211) Pulse Rate:  [56-73] 60 (01/18 1211) Resp:  [16-20] 16 (01/18 1211) BP: (99-116)/(58-76) 104/65 (01/18 1211) SpO2:  [94 %-98 %] 97 % (01/18 1211)  Intake/Output from previous day: 01/17 0701 - 01/18 0700 In: 1121.2 [I.V.:1021] Out: -  Intake/Output this shift: Total I/O In: 380 [P.O.:380] Out: -   ENMT: Mucous membranes are moist. Posterior pharynx clear of any exudate or lesions.Normal dentition.  Neck: normal, supple, no masses, no thyromegaly Respiratory: clear to auscultation bilaterally, no wheezing, no crackles. Normal respiratory effort. No accessory muscle use.  Cardiovascular:  Regular, rate and rhythm; no murmurs / rubs / gallops. No extremity edema. 2+ pedal pulses. No carotid bruits.  Abdomen: no tenderness, no masses palpated. No hepatosplenomegaly. Bowel sounds positive.  Musculoskeletal: no clubbing / cyanosis. No joint deformity upper and lower extremities. Good ROM, no contractures. Normal muscle tone.  Skin: no rashes, lesions, ulcers. No induration Neurologic: CN 2-12 grossly intact. Sensation intact, DTR normal. Strength 5/5 in all 4.  Psychiatric:  alert and oriented to person  Results for orders placed or performed during the hospital encounter of 04/12/19 (from the past 24 hour(s))  Glucose, capillary     Status: Abnormal   Collection Time: 04/14/19  9:07 PM  Result Value Ref Range   Glucose-Capillary 104 (H) 70 - 99 mg/dL  Glucose, capillary     Status: Abnormal   Collection Time: 04/15/19  8:05 AM  Result Value Ref Range   Glucose-Capillary 110 (H) 70 - 99 mg/dL  Glucose, capillary     Status: Abnormal   Collection Time: 04/15/19 12:08 PM  Result Value Ref Range   Glucose-Capillary 137 (H) 70 - 99 mg/dL   Comment 1 Notify RN   Glucose,  capillary     Status: Abnormal   Collection Time: 04/15/19  4:18 PM  Result Value Ref Range   Glucose-Capillary 150 (H) 70 - 99 mg/dL    Studies/Results: DG Chest 1 View  Result Date: 04/12/2019 CLINICAL DATA:  Weakness. Possible fall. EXAM: CHEST  1 VIEW COMPARISON:  Radiograph 02/10/2019 FINDINGS: The cardiomediastinal contours are normal. Aortic atherosclerosis. Pulmonary vasculature is normal. No consolidation, pleural effusion, or pneumothorax. No acute osseous abnormalities are seen. The bones are under mineralized. No acute osseous abnormalities are seen. IMPRESSION: No acute abnormality. Electronically Signed   By: Keith Rake M.D.   On: 04/12/2019 20:58   CT Head Wo Contrast  Result Date: 04/12/2019 CLINICAL DATA:  Fall.  Weakness.  Dementia.  Headache.  Hypotension. EXAM: CT HEAD WITHOUT CONTRAST CT CERVICAL SPINE WITHOUT CONTRAST TECHNIQUE: Multidetector CT imaging of the head and cervical spine was performed following the standard protocol without intravenous contrast. Multiplanar CT image reconstructions of the cervical spine were also generated. COMPARISON:  03/30/2019 FINDINGS: CT HEAD FINDINGS Brain: Advanced generalized brain atrophy. Chronic small-vessel ischemic changes affecting the pons, thalami, basal ganglia and cerebral hemispheric white matter. No cortical or large vessel territory infarction. No mass lesion, hemorrhage, hydrocephalus or extra-axial collection. Vascular: There is atherosclerotic calcification of the major vessels at the base of the brain. Skull: No skull fracture. Sinuses/Orbits: Clear/normal Other: None CT CERVICAL SPINE FINDINGS Alignment: Normal Skull base and vertebrae: No acute fracture. Healing minor superior endplate fracture at T1, which was acute in October of last year. Soft tissues  and spinal canal: Normal Disc levels: Ordinary osteoarthritis at the C1-2 articulation. Normal disc spaces from C2-3 through C5-6. Disc space narrowing at C6-7 with  small osteophytes but no significant stenosis. Upper chest: Negative Other: None IMPRESSION: Head CT: No acute finding. Advanced atrophy and chronic small-vessel ischemic changes as above. Cervical spine CT: No acute finding. Healing superior endplate fracture at T1 without further collapse. Electronically Signed   By: Paulina Fusi M.D.   On: 04/12/2019 20:18   CT Head Wo Contrast  Result Date: 03/30/2019 CLINICAL DATA:  Headaches following recent fall, initial encounter EXAM: CT HEAD WITHOUT CONTRAST TECHNIQUE: Contiguous axial images were obtained from the base of the skull through the vertex without intravenous contrast. COMPARISON:  01/20/2019 FINDINGS: Brain: Chronic atrophic and ischemic changes are identified. No findings to suggest acute hemorrhage, acute infarction or space-occupying mass lesion are noted. Scattered thalamic lacunar infarcts are seen. Vascular: No hyperdense vessel or unexpected calcification. Skull: Normal. Negative for fracture or focal lesion. Sinuses/Orbits: No acute finding. Other: None. IMPRESSION: Chronic atrophic and ischemic changes without acute abnormality. Electronically Signed   By: Alcide Clever M.D.   On: 03/30/2019 14:33   CT Cervical Spine Wo Contrast  Result Date: 04/12/2019 CLINICAL DATA:  Fall.  Weakness.  Dementia.  Headache.  Hypotension. EXAM: CT HEAD WITHOUT CONTRAST CT CERVICAL SPINE WITHOUT CONTRAST TECHNIQUE: Multidetector CT imaging of the head and cervical spine was performed following the standard protocol without intravenous contrast. Multiplanar CT image reconstructions of the cervical spine were also generated. COMPARISON:  03/30/2019 FINDINGS: CT HEAD FINDINGS Brain: Advanced generalized brain atrophy. Chronic small-vessel ischemic changes affecting the pons, thalami, basal ganglia and cerebral hemispheric white matter. No cortical or large vessel territory infarction. No mass lesion, hemorrhage, hydrocephalus or extra-axial collection. Vascular:  There is atherosclerotic calcification of the major vessels at the base of the brain. Skull: No skull fracture. Sinuses/Orbits: Clear/normal Other: None CT CERVICAL SPINE FINDINGS Alignment: Normal Skull base and vertebrae: No acute fracture. Healing minor superior endplate fracture at T1, which was acute in October of last year. Soft tissues and spinal canal: Normal Disc levels: Ordinary osteoarthritis at the C1-2 articulation. Normal disc spaces from C2-3 through C5-6. Disc space narrowing at C6-7 with small osteophytes but no significant stenosis. Upper chest: Negative Other: None IMPRESSION: Head CT: No acute finding. Advanced atrophy and chronic small-vessel ischemic changes as above. Cervical spine CT: No acute finding. Healing superior endplate fracture at T1 without further collapse. Electronically Signed   By: Paulina Fusi M.D.   On: 04/12/2019 20:18    Scheduled Meds: . apixaban  2.5 mg Oral BID  . ciprofloxacin  500 mg Oral BID  . insulin aspart  0-5 Units Subcutaneous QHS  . insulin aspart  0-9 Units Subcutaneous TID WC   Continuous Infusions: . dextrose 5 % and 0.9% NaCl 75 mL/hr at 04/15/19 0644   PRN Meds:ondansetron **OR** ondansetron (ZOFRAN) IV  Assessment/Plan: #1 UTI:   -Started treatment withRocephin; changed to ciprofloxacin twice daily for 4 more days -Urine culture from 04/12/2019 grew Citrobacter and Klebsiella.  Antibiotic based on ID and susceptibility -Blood culture from 04/12/2019 shows no growth so far -Mental status improved and her p.o. intake also improved  #2 acute metabolic encephalopathy: Waxing and waning In the setting of known dementia.   -Status post IV minimal hydration on admission; and has a history of CHF -Likely to be in her baseline -Tinea supportive treatment -Avoid benzos  #3 CHF: History of; no acute issue  -  Chest x-ray shows no pulmonary edema or effusion -No edema or swelling of the extremities -No edema or swelling of the  extremities  #4 diabetes:  Stable range  - sliding scale insulin.  #5 history of PE: Patient is on chronic anticoagulation.    Continue home med  #6 dementia: Suspected vascular dementia.  -Appears to be having waxing and waning mental status -Spoke with daughter in great detail who mention patient has some good and bad days  #7 hypotension: stable  Currently  -  Most likely from dehydration- poor PO intake -S/P IV gentle hydration since patient has not been eating or drinking much patient is likely to be very dehydrated -Monitor strict ins and outs; she has an external catheter  #8 acute kidney injury:  Most likely acute on chronic kidney disease  - slowly improving as creatinine improved to 1.3; may be her new baseline -  Encourage p.o. hydration - Discontinue home Metformin  Diet: Improving PO intake; diet per ST - Encourage PO intake   Family Communication: Called daughter, Elvin So. Updated today. Answered all questions. Expressed understanding. At baseline Pt is alert and recognizes  DVT prophylaxis: Apixaban   LOS: 3 days   Sui Kasparek Harold Hedge

## 2019-04-15 NOTE — Evaluation (Signed)
Occupational Therapy Evaluation Patient Details Name: Jasmine Hubbard MRN: [id number] DOB: 1931/05/21 Today's Date: 04/15/2019    History of Present Illness 86 y.o. female with medical history significant of Alzheimer's and vascular dementia, diabetes, hypertension, history of PE on chronic anticoagulation, diastolic dysfunction CHF, systolic dysfunction who was brought in to ED on 04/12/2019 from skilled facility with generalized weakness, hypotension, dizziness and headache. Patient being admitted with acute metabolic encephalopathy from UTI.Marland Kitchen   Clinical Impression   Pt was seen for OT evaluation this date. No family present. Per chart, daughter reports that prior to hospital admission, pt was requiring assist for functional transfers, bathing, dressing, and meal prep; ambulating with RW once up on her feet. Pt is from [facility], per chart. Pt initially sleeping upon OT's arrival but wakes easily to gentle verbal and tactile cues. Pt conversational, able to answer approx 75% or more simple questions appropriately. Currently pt demonstrates impairments as described below (See OT problem list below) which functionally limit her ability to perform ADL/self-care tasks. Pt currently requires at least moderate assist for bathing, dressing, and toileting. Limited functional mobility assessment, 2/2 pt's refusal to attempt despite education in benefits and encouragement. RN notified of session. Pt would benefit from skilled OT to address noted impairments and functional limitations (see below for any additional details) in order to maximize safety and independence while minimizing falls risk and caregiver burden.  Upon hospital discharge, recommend pt discharge to SNF.    Follow Up Recommendations  SNF    Equipment Recommendations  3 in 1 bedside commode    Recommendations for Other Services       Precautions / Restrictions Precautions Precautions: Fall Restrictions Weight Bearing  Restrictions: No      Mobility Bed Mobility               General bed mobility comments: pt declined despite encouragement and education in benefits  Transfers                 General transfer comment: pt declined despite encouragement and education in benefits    Balance                                           ADL either performed or assessed with clinical judgement   ADL Overall ADL's : Needs assistance/impaired Eating/Feeding: Independent;Bed level Eating/Feeding Details (indicate cue type and reason): pt denies difficulty or assist to self feed meal prior to OT's arrival Grooming: Set up;Bed level Grooming Details (indicate cue type and reason): Nursing report pt was able to brush her teeth with set up                               General ADL Comments: Functional assessment somewhat limited this date 2/2 pt declining EOB/OOB attempts; requires at least Mod Assist for LB bathing and dressing, would anticipate significant assist for toilet transfer attempts as well     Vision Baseline Vision/History: Wears glasses Patient Visual Report: No change from baseline Vision Assessment?: No apparent visual deficits     Perception     Praxis      Pertinent Vitals/Pain Pain Assessment: No/denies pain     Hand Dominance     Extremity/Trunk Assessment Upper Extremity Assessment Upper Extremity Assessment: Generalized weakness(grossly at least 3/5, hx R shoulder surgery  per pt with limiting shoulder flexion to approx 50%)   Lower Extremity Assessment Lower Extremity Assessment: Generalized weakness(grossly at least 3/5)       Communication     Cognition Arousal/Alertness: Awake/alert Behavior During Therapy: WFL for tasks assessed/performed Overall Cognitive Status: History of cognitive impairments - at baseline                                 General Comments: Pt alert, oriented to self, general situation,  hospital, follows simple commands with cues, unable to verbalize PLOF   General Comments       Exercises     Shoulder Instructions      Home Living Family/patient expects to be discharged to:: Assisted living(per chart, [facility])                                 Additional Comments: Pt is unreliable historian and no family present to confirm      Prior Functioning/Environment Level of Independence: Needs assistance  Gait / Transfers Assistance Needed: per chart, daughter reporting pt required assist for initial transfers but then mod indep for mobility using RW (unclear what type) ADL's / Homemaking Assistance Needed: Per chart, daughter reporting pt required assist for bathing, dressing; pt able to feed herself            OT Problem List: Decreased strength;Decreased cognition;Decreased safety awareness;Impaired balance (sitting and/or standing);Decreased knowledge of use of DME or AE      OT Treatment/Interventions: Self-care/ADL training;Therapeutic exercise;Therapeutic activities;Cognitive remediation/compensation;DME and/or AE instruction;Patient/family education;Balance training    OT Goals(Current goals can be found in the care plan section) Acute Rehab OT Goals Patient Stated Goal: to get better and go home OT Goal Formulation: With patient Time For Goal Achievement: 04/29/19 Potential to Achieve Goals: Good ADL Goals Pt Will Perform Grooming: with set-up;with supervision;sitting Pt Will Transfer to Toilet: with mod assist;stand pivot transfer;bedside commode(LRAD for amb) Additional ADL Goal #1: Pt will perform bed mobility requiring Mod-Max assist in preparation for seated EOB ADL tasks.  OT Frequency: Min 1X/week   Barriers to D/C:            Co-evaluation              AM-PAC OT "6 Clicks" Daily Activity     Outcome Measure Help from another person eating meals?: None Help from another person taking care of personal grooming?:  A Little Help from another person toileting, which includes using toliet, bedpan, or urinal?: A Lot Help from another person bathing (including washing, rinsing, drying)?: A Lot Help from another person to put on and taking off regular upper body clothing?: A Lot Help from another person to put on and taking off regular lower body clothing?: A Lot 6 Click Score: 15   End of Session Nurse Communication: (reported to RN status of OT evaluation and pt's performance)  Activity Tolerance: Patient tolerated treatment well Patient left: in bed;with call bell/phone within reach;with bed alarm set  OT Visit Diagnosis: Other abnormalities of gait and mobility (R26.89);Muscle weakness (generalized) (M62.81)                Time: 6213-0865 OT Time Calculation (min): 11 min Charges:  OT General Charges $OT Visit: 1 Visit OT Evaluation $OT Eval Moderate Complexity: 1 Mod  Jeni Salles, MPH, MS, OTR/L ascom [phone] 04/15/19, 1:11  PM

## 2019-04-15 NOTE — TOC Initial Note (Signed)
Transition of Care [facility]) - Initial/Assessment Note    Patient Details  Name: Jasmine Hubbard MRN: [id number] Date of Birth: [birth year]  [facility] CM/SW Contact:    Trenton Founds, RN Phone Number: 04/15/2019, 3:40 PM  Clinical Narrative:  RNCM placed call into patient's room d/t bedside nurse reporting daughter was present. Spoke with patient's daughter Toniann Fail who reports they would very much like to take patient back to her ALF. Did discuss that PT/OT had recommended patient go to SNF but daughter verbalizes she thinks her mother would be much more comfortable where she is familiar and they have therapy there. Discussed that this CM would contact facility to discuss.  RNCM placed call to facility and after being transferred several times was able to speak with Brittney who reports there Admin is out today but she feels very strongly that they will be able to take her back with therapy there but it may be tomorrow morning before she can give a definite answer.  Brittney reports she will call this CM back when able. Returned call to patient's daughter to relay information and she Pension scheme manager. RNCM will follow.                 Expected Discharge Plan: Assisted Living Barriers to Discharge: Barriers Resolved   Patient Goals and CMS Choice Patient states their goals for this hospitalization and ongoing recovery are:: per daughter to get her back to ALF where she is comfortable      Expected Discharge Plan and Services Expected Discharge Plan: Assisted Living   Discharge Planning Services: CM Consult Post Acute Care Choice: Home Health Living arrangements for the past 2 months: Assisted Living Facility Expected Discharge Date: 04/15/19                                    Prior Living Arrangements/Services Living arrangements for the past 2 months: Assisted Living Facility Lives with:: Facility Resident Patient language and need for interpreter  reviewed:: Yes Do you feel safe going back to the place where you live?: Yes      Need for Family Participation in Patient Care: Yes (Comment) Care giver support system in place?: Yes (comment)   Criminal Activity/Legal Involvement Pertinent to Current Situation/Hospitalization: No - Comment as needed  Activities of Daily Living   ADL Screening (condition at time of admission) Patient's cognitive ability adequate to safely complete daily activities?: No Is the patient deaf or have difficulty hearing?: Yes Does the patient have difficulty concentrating, remembering, or making decisions?: Yes  Permission Sought/Granted                  Emotional Assessment Appearance:: (Daughter assessed via telephone) Attitude/Demeanor/Rapport: Engaged     Alcohol / Substance Use: Never Used Psych Involvement: No (comment)  Admission diagnosis:  UTI (urinary tract infection) [N39.0] Weakness [R53.1] Fall, initial encounter [W19.XXXA] Patient Active Problem List   Diagnosis Date Noted  . Dementia (HCC) 04/12/2019  . Acute metabolic encephalopathy 04/12/2019  . Diabetes (HCC) 04/12/2019  . Chronic diastolic CHF (congestive heart failure) (HCC) 04/12/2019  . UTI (urinary tract infection) 04/12/2019   PCP:  [name], MD Pharmacy:  No Pharmacies Listed    Social Determinants of Health (SDOH) Interventions    Readmission Risk Interventions Readmission Risk Prevention Plan 04/15/2019  Transportation Screening Complete  PCP or Specialist Appt within 3-5 Days Complete  Palliative Care Screening  Not Applicable  Medication Review (RN Care Manager) Complete  Some recent data might be hidden    

## 2019-04-15 NOTE — Progress Notes (Addendum)
SLP Note  Patient Details Name: Jasmine Hubbard MRN: [id number] DOB: 1931-05-29   Cancelled treatment:       Reason Eval/Treat Not Completed: (chart reviewed; consulted NSG re: pt's status). Per NSG report today, pt is tolerating her current Minced foods diet w/ thin liquids w/ NO overt clinical s/s of aspiration noted during intake. She is taking small amounts of po's at a time and requires assistance at meals from Dtr or staff d/t Cognitive decline(baseline). Recommend Dietician f/u for nutritional support.  Recommend continue current Dys. Level 2 (minced foods) diet for ease of oral intake w/ baseline Dementia/Cognitive decline; safe toleration. Recommend general aspiration precautions, oral care. See BSE for details.  No further skilled ST services indicated at this time.     Jerilynn Som, MS, CCC-SLP Watson,Katherine 04/15/2019, 1:02 PM

## 2019-04-15 NOTE — Evaluation (Signed)
Physical Therapy Evaluation Patient Details Name: Jasmine Hubbard MRN: [id number] DOB: Dec 07, 1931 Today's Date: 04/15/2019   History of Present Illness  86 y.o. female with medical history significant of Alzheimer's and vascular dementia, diabetes, hypertension, history of PE on chronic anticoagulation, diastolic dysfunction CHF, systolic dysfunction who was brought in to ED on 04/12/2019 from skilled facility with generalized weakness, hypotension, dizziness and headache. Patient being admitted with acute metabolic encephalopathy from UTI.Marland Kitchen  Clinical Impression  Pt is an 86 yo female admitted for above. Pt received in bed finishing up lunch tray and agreeable to PT. Per chart review, pt lives at [place] and ambulates with RW however needs assistance for bed mobility, dressing, bathing. Pt required frequent encouragement for active participation in therapy. Pt presents with decreased strength (grossly 3/5), balance, activity tolerance and cognition limiting functional mobility. Pt required min A to get EOB and transfer from bed. Pt required min guard assist for ambulating with Rw within the room requiring min cuing for keeping RW and close and 1-2 instances of min A for RW mgt around obstacles and with turns. Pt performed LE therex requiring mod tc/vc for correct performance. Pt reporting feeling fatigued end of session. Pt would benefit from further acute PT to improve noted deficits in order to maximize functional mobility. Recommendation for SNF following hospital discharge to further improve deficits, allow for return to PLOF, decrease fall risk and caregiver burden. pts daughter educated on recommendation and in agreement.     Follow Up Recommendations SNF    Equipment Recommendations  None recommended by PT    Recommendations for Other Services       Precautions / Restrictions Precautions Precautions: Fall Restrictions Weight Bearing Restrictions: No      Mobility   Bed Mobility Overal bed mobility: Needs Assistance Bed Mobility: Supine to Sit     Supine to sit: Min assist;HOB elevated     General bed mobility comments: min A to get EOB, pt required encouragement for initiation and technique, min A for trunk elevation  Transfers Overall transfer level: Needs assistance Equipment used: Rolling walker (2 wheeled) Transfers: Sit to/from Stand Sit to Stand: Min assist         General transfer comment: light min A to power to full upright from bed, pt required cuing for hand placement and safe use of RW pt continues to try and pull up using RW  Ambulation/Gait Ambulation/Gait assistance: Min guard Gait Distance (Feet): 15 Feet Assistive device: Rolling walker (2 wheeled) Gait Pattern/deviations: Step-through pattern;Decreased stride length;Shuffle;Trunk flexed Gait velocity: decreased   General Gait Details: pt ambulated with RW, overall steady with RW, cuing required for RW mgt during turns and around obstacles, pt likes to keep RW in front, no buckling or LOB noted, requires mod encouragement to progress to ambulation  Stairs            Wheelchair Mobility    Modified Rankin (Stroke Patients Only)       Balance Overall balance assessment: Needs assistance Sitting-balance support: Bilateral upper extremity supported;Feet supported Sitting balance-Leahy Scale: Fair     Standing balance support: During functional activity;Single extremity supported Standing balance-Leahy Scale: Fair Standing balance comment: reliant on at least single UE support for static standing, B UE support for ambulation                             Pertinent Vitals/Pain Pain Assessment: No/denies pain  Home Living Family/patient expects to be discharged to:: Assisted living([facility])                 Additional Comments: Pt is unreliable historian    Prior Function Level of Independence: Needs assistance   Gait /  Transfers Assistance Needed: per daughter pt has been ambulating with RW  ADL's / Homemaking Assistance Needed: Per chart, daughter reporting pt required assist for bathing, dressing; pt able to feed herself        Hand Dominance        Extremity/Trunk Assessment   Upper Extremity Assessment Upper Extremity Assessment: Generalized weakness;Defer to OT evaluation    Lower Extremity Assessment Lower Extremity Assessment: Generalized weakness;Difficult to assess due to impaired cognition(grossly 3/5)       Communication   Communication: HOH  Cognition Arousal/Alertness: Awake/alert Behavior During Therapy: WFL for tasks assessed/performed Overall Cognitive Status: History of cognitive impairments - at baseline                                 General Comments: pt alert and oriented to self, follows simple commands with increased time and cues      General Comments General comments (skin integrity, edema, etc.): VSS    Exercises Total Joint Exercises Ankle Circles/Pumps: AROM;Both;10 reps Short Arc Quad: AROM;Both;10 reps Long Arc Quad: AROM;Both;10 reps   Assessment/Plan    PT Assessment Patient needs continued PT services  PT Problem List Decreased mobility;Decreased strength;Decreased range of motion;Decreased activity tolerance;Decreased balance;Decreased knowledge of use of DME;Decreased cognition;Decreased safety awareness       PT Treatment Interventions DME instruction;Therapeutic exercise;Gait training;Balance training;Neuromuscular re-education;Stair training;Cognitive remediation;Functional mobility training;Therapeutic activities;Patient/family education    PT Goals (Current goals can be found in the Care Plan section)  Acute Rehab PT Goals Patient Stated Goal: go home PT Goal Formulation: With patient Time For Goal Achievement: 04/29/19 Potential to Achieve Goals: Fair    Frequency Min 2X/week   Barriers to discharge         Co-evaluation               AM-PAC PT "6 Clicks" Mobility  Outcome Measure Help needed turning from your back to your side while in a flat bed without using bedrails?: A Little Help needed moving from lying on your back to sitting on the side of a flat bed without using bedrails?: A Little Help needed moving to and from a bed to a chair (including a wheelchair)?: A Little Help needed standing up from a chair using your arms (e.g., wheelchair or bedside chair)?: A Little Help needed to walk in hospital room?: A Little Help needed climbing 3-5 steps with a railing? : A Lot 6 Click Score: 17    End of Session Equipment Utilized During Treatment: Gait belt Activity Tolerance: Patient tolerated treatment well Patient left: in chair;with call bell/phone within reach;with chair alarm set Nurse Communication: Mobility status PT Visit Diagnosis: Unsteadiness on feet (R26.81);Difficulty in walking, not elsewhere classified (R26.2);Muscle weakness (generalized) (M62.81)    Time: 9924-2683 PT Time Calculation (min) (ACUTE ONLY): 34 min   Charges:   PT Evaluation $PT Eval Moderate Complexity: 1 Mod PT Treatments $Therapeutic Activity: 8-22 mins        Bliss Behnke PT, DPT 3:11 PM,04/15/19 [phone]  Epic [place]bett Shyrl Numbers 04/15/2019, 3:06 PM

## 2019-04-16 LAB — RESPIRATORY PANEL BY RT PCR (FLU A&B, COVID)
Influenza A by PCR: NEGATIVE
Influenza B by PCR: NEGATIVE
SARS Coronavirus 2 by RT PCR: NEGATIVE

## 2019-04-16 LAB — GLUCOSE, CAPILLARY
Glucose-Capillary: 121 mg/dL — ABNORMAL HIGH (ref 70–99)
Glucose-Capillary: 92 mg/dL (ref 70–99)

## 2019-04-16 MED ORDER — PROBIOTIC 250 MG PO CAPS: 1.0000 | ORAL_CAPSULE | Freq: Two times a day (BID) | ORAL | 0 refills | Status: AC

## 2019-04-16 MED ORDER — INSULIN ASPART 100 UNIT/ML ~~LOC~~ SOLN: 0.0000 [IU] | Freq: Every day | SUBCUTANEOUS | 0 refills | Status: DC

## 2019-04-16 MED ORDER — CIPROFLOXACIN 500 MG/5ML (10%) PO SUSR: 500.0000 mg | Freq: Two times a day (BID) | ORAL | 0 refills | Status: AC

## 2019-04-16 MED ORDER — INSULIN ASPART 100 UNIT/ML ~~LOC~~ SOLN: 0.0000 [IU] | Freq: Three times a day (TID) | SUBCUTANEOUS | 0 refills | Status: DC

## 2019-04-16 NOTE — Progress Notes (Signed)
Pt discharged per MD order. IV removed. Pt being transported by facility. Pt wheeled downstairs to meet facility transported by staff. Discharge paperwork sent with pt.

## 2019-04-16 NOTE — NC FL2 (Signed)
[place] MEDICAID FL2 LEVEL OF CARE SCREENING TOOL     IDENTIFICATION  Patient Name: Jasmine Hubbard Birthdate: [birth year] Sex: female Admission Date (Current Location): 04/12/2019  [facility] and [place] Number:  Chiropodist and Address:         Provider Number: [id number]  Attending Physician Name and Address:  Thomasenia Bottoms, MD  Relative Name and Phone Number:       Current Level of Care: Hospital Recommended Level of Care: Assisted Living Facility Prior Approval Number:    Date Approved/Denied:   PASRR Number:    Discharge Plan: Other (Comment)(Assisted Living)    Current Diagnoses: Patient Active Problem List   Diagnosis Date Noted  . Dementia (HCC) 04/12/2019  . Acute metabolic encephalopathy 04/12/2019  . Diabetes (HCC) 04/12/2019  . Chronic diastolic CHF (congestive heart failure) (HCC) 04/12/2019  . UTI (urinary tract infection) 04/12/2019    Orientation RESPIRATION BLADDER Height & Weight     Self  Normal Incontinent Weight: 58.1 kg Height:  5\' 2"  (157.5 cm)  BEHAVIORAL SYMPTOMS/MOOD NEUROLOGICAL BOWEL NUTRITION STATUS      Continent Diet(dys 2)  AMBULATORY STATUS COMMUNICATION OF NEEDS Skin   Supervision Verbally Normal                       Personal Care Assistance Level of Assistance              Functional Limitations Info             SPECIAL CARE FACTORS FREQUENCY  PT (By licensed PT), OT (By licensed OT)(Referral made to [facility])                    Contractures Contractures Info: Not present    Additional Factors Info  Code Status, Allergies(Full) Code Status Info: Full Allergies Info: Naproxen           Medication List        STOP taking these medications       hydrOXYzine 25 MG tablet Commonly known as: ATARAX/VISTARIL   loperamide 2 MG capsule Commonly known as: IMODIUM   metFORMIN 500 MG tablet Commonly known as: GLUCOPHAGE             TAKE these medications        acetaminophen 325 MG tablet Commonly known as: TYLENOL Take 650 mg by mouth every 6 (six) hours as needed for mild pain.   alendronate 70 MG tablet Commonly known as: FOSAMAX Take 70 mg by mouth every Friday. Take with a full glass of water on an empty stomach.   Calcium Carb-Cholecalciferol 600-400 MG-UNIT Tabs Take 1 tablet by mouth 2 (two) times a day.   Calcium Citrate-Vitamin D 250-200 MG-UNIT Tabs Take 2 tablets by mouth at bedtime.   ciprofloxacin 500 MG/5ML (10%) suspension Commonly known as: CIPRO Take 5 mLs (500 mg total) by mouth 2 (two) times daily for 4 days.   citalopram 10 MG tablet Commonly known as: CELEXA Take 10 mg by mouth daily.   Eliquis 2.5 MG Tabs tablet Generic drug: apixaban Take 2.5 mg by mouth 2 (two) times daily.   furosemide 20 MG tablet Commonly known as: LASIX Take 20 mg by mouth daily.   guaifenesin 100 MG/5ML syrup Commonly known as: ROBITUSSIN Take 200 mg by mouth every 6 (six) hours as needed for cough or congestion. (max 4 doses daily)   insulin aspart 100 UNIT/ML injection Commonly known as: novoLOG  Inject 0-5 Units into the skin at bedtime.   insulin aspart 100 UNIT/ML injection Commonly known as: novoLOG Inject 0-9 Units into the skin 3 (three) times daily with meals.   lovastatin 20 MG tablet Commonly known as: MEVACOR Take 40 mg by mouth at bedtime.   magnesium hydroxide 400 MG/5ML suspension Commonly known as: MILK OF MAGNESIA Take 30 mLs by mouth daily as needed for mild constipation.   Mintox 951-884-16 MG/5ML suspension Generic drug: alum & mag hydroxide-simeth Take 30 mLs by mouth every 6 (six) hours as needed for indigestion or heartburn.   neomycin-bacitracin-polymyxin Oint Commonly known as: NEOSPORIN Apply 1 application topically 4 (four) times daily as needed for wound care.   Probiotic 250 MG Caps Take 1 each by mouth 2 (two) times daily.   sodium chloride 0.65 % Soln nasal  spray Commonly known as: OCEAN Place 1 spray into both nostrils 3 (three) times daily.    Relevant Imaging Results:  Relevant Lab Results:   Additional Information    Carlin Attridge, Illene Silver, RN

## 2019-04-16 NOTE — TOC Transition Note (Signed)
Transition of Care [facility]) - CM/SW Discharge Note   Patient Details  Name: Jasmine Hubbard MRN: [id number] Date of Birth: [birth year]  [facility] CM/SW Contact:  Chapman Fitch, RN Phone Number: 04/16/2019, 4:30 PM   Clinical Narrative:     Patient to discharge today Facetime assessment completed by Melody at [facility] with patient. She has confirmed she can accept patient back  Patient will have home health services.  Per Melody their preference of home health agency is [facility] or [facility].  Daughter in agreement with plan and states she does not have a preference of home health agency.  Referral made and accepted by Rosey Bath with [facility].   Repeat covid test negative.  covid results and fl2 faxed to [facility].  Hard copy placed in discharge packet   Facility to transport at discharge.  Bedside RN notified Final next level of care: Assisted Living Barriers to Discharge: No Barriers Identified   Patient Goals and CMS Choice Patient states their goals for this hospitalization and ongoing recovery are:: per daughter to get her back to ALF where she is comfortable      Discharge Placement                  Name of family member notified: Daugther Patient and family notified of of transfer: 04/16/19  Discharge Plan and Services   Discharge Planning Services: CM Consult Post Acute Care Choice: Home Health                    HH Arranged: PT, OT HH Agency: [facility] ([facility]) Date HH Agency Contacted: 04/16/19   Representative spoke with at [facility] Agency: Rosey Bath  Social Determinants of Health (SDOH) Interventions     Readmission Risk Interventions Readmission Risk Prevention Plan 04/16/2019 04/15/2019  Transportation Screening Complete Complete  PCP or Specialist Appt within 3-5 Days - Complete  HRI or Home Care Consult Complete -  Palliative Care Screening Not Applicable Not Applicable  Medication Review (RN  Care Manager) Complete Complete  Some recent data might be hidden

## 2019-04-16 NOTE — Progress Notes (Signed)
Patient discharged prior to shift to [facility]. Army Fossa, RN reviewed information.

## 2019-04-17 LAB — CULTURE, BLOOD (ROUTINE X 2)
Culture: NO GROWTH
Culture: NO GROWTH

## 2019-04-28 ENCOUNTER — Other Ambulatory Visit: Payer: Self-pay

## 2019-04-28 ENCOUNTER — Emergency Department
Admission: EM | Admit: 2019-04-28 | Discharge: 2019-04-28 | Disposition: A | Discharge status: Home / Self Care | Payer: Medicare PPO | Attending: Emergency Medicine | Admitting: Emergency Medicine

## 2019-04-28 DIAGNOSIS — Z20822 Contact with and (suspected) exposure to covid-19: Secondary | ICD-10-CM | POA: Insufficient documentation

## 2019-04-28 DIAGNOSIS — E119 Type 2 diabetes mellitus without complications: Secondary | ICD-10-CM | POA: Insufficient documentation

## 2019-04-28 DIAGNOSIS — Z87891 Personal history of nicotine dependence: Secondary | ICD-10-CM | POA: Diagnosis not present

## 2019-04-28 DIAGNOSIS — I110 Hypertensive heart disease with heart failure: Secondary | ICD-10-CM | POA: Insufficient documentation

## 2019-04-28 DIAGNOSIS — I5032 Chronic diastolic (congestive) heart failure: Secondary | ICD-10-CM | POA: Diagnosis not present

## 2019-04-28 DIAGNOSIS — F0390 Unspecified dementia without behavioral disturbance: Secondary | ICD-10-CM | POA: Diagnosis not present

## 2019-04-28 DIAGNOSIS — Z794 Long term (current) use of insulin: Secondary | ICD-10-CM | POA: Insufficient documentation

## 2019-04-28 DIAGNOSIS — Z7901 Long term (current) use of anticoagulants: Secondary | ICD-10-CM | POA: Insufficient documentation

## 2019-04-28 DIAGNOSIS — Z79899 Other long term (current) drug therapy: Secondary | ICD-10-CM | POA: Insufficient documentation

## 2019-04-28 LAB — POC SARS CORONAVIRUS 2 AG: SARS Coronavirus 2 Ag: NEGATIVE

## 2019-04-28 NOTE — ED Provider Notes (Signed)
[facility] Emergency Department Provider Note   ____________________________________________    I have reviewed the triage vital signs and the nursing notes.   HISTORY  Chief Complaint Medical Clearance  History of dementia and limits history   HPI Jasmine Hubbard is a 86 y.o. female with a history of CHF, diabetes and dementia sent from nursing home for concerns of increased work of breathing and request for Covid test.  Patient reports she feels "okay "however she is unable to provide significant history.  Past Medical History:  Diagnosis Date  . CHF (congestive heart failure) (HCC)   . Diabetes mellitus without complication (HCC)   . Left ventricular failure (HCC)   . Osteoporosis   . Pulmonary embolism (HCC)   . Pulmonary embolus (HCC)   . Vascular dementia [facility])     Patient Active Problem List   Diagnosis Date Noted  . Dementia (HCC) 04/12/2019  . Acute metabolic encephalopathy 04/12/2019  . Diabetes (HCC) 04/12/2019  . Chronic diastolic CHF (congestive heart failure) (HCC) 04/12/2019  . UTI (urinary tract infection) 04/12/2019    Past Surgical History:  Procedure Laterality Date  . ABDOMINAL HYSTERECTOMY    . BACK SURGERY    . FRACTURE SURGERY     elbow    Prior to Admission medications   Medication Sig Start Date End Date Taking? Authorizing Provider  acetaminophen (TYLENOL) 325 MG tablet Take 650 mg by mouth every 6 (six) hours as needed for mild pain.    [provider]  alendronate (FOSAMAX) 70 MG tablet Take 70 mg by mouth every Friday. Take with a full glass of water on an empty stomach.     [provider]  alum & mag hydroxide-simeth (MINTOX) 200-200-20 MG/5ML suspension Take 30 mLs by mouth every 6 (six) hours as needed for indigestion or heartburn.    [provider]  apixaban (ELIQUIS) 2.5 MG TABS tablet Take 2.5 mg by mouth 2 (two) times daily.     [provider]   Calcium Carb-Cholecalciferol 600-400 MG-UNIT TABS Take 1 tablet by mouth 2 (two) times a day.     [provider]  Calcium Citrate-Vitamin D 250-200 MG-UNIT TABS Take 2 tablets by mouth at bedtime.     [provider]  citalopram (CELEXA) 10 MG tablet Take 10 mg by mouth daily.    [provider]  furosemide (LASIX) 20 MG tablet Take 20 mg by mouth daily.     [provider]  guaifenesin (ROBITUSSIN) 100 MG/5ML syrup Take 200 mg by mouth every 6 (six) hours as needed for cough or congestion. (max 4 doses daily)    [provider]  insulin aspart (NOVOLOG) 100 UNIT/ML injection Inject 0-9 Units into the skin 3 (three) times daily with meals. 04/16/19   Thomasenia Bottoms, MD  insulin aspart (NOVOLOG) 100 UNIT/ML injection Inject 0-5 Units into the skin at bedtime. 04/16/19   Thomasenia Bottoms, MD  lovastatin (MEVACOR) 20 MG tablet Take 40 mg by mouth at bedtime.    [provider]  magnesium hydroxide (MILK OF MAGNESIA) 400 MG/5ML suspension Take 30 mLs by mouth daily as needed for mild constipation.    [provider]  neomycin-bacitracin-polymyxin (NEOSPORIN) OINT Apply 1 application topically 4 (four) times daily as needed for wound care.    [provider]  Saccharomyces boulardii (PROBIOTIC) 250 MG CAPS Take 1 each by mouth 2 (two) times daily. 04/16/19   Thomasenia Bottoms, MD  sodium chloride (  OCEAN) 0.65 % SOLN nasal spray Place 1 spray into both nostrils 3 (three) times daily.     [provider]     Allergies Naproxen  No family history on file.  Social History Social History   Tobacco Use  . Smoking status: Former Smoker    Types: Cigarettes    Quit date: 08/28/1999    Years since quitting: 19.6  . Smokeless tobacco: Never Used  Substance Use Topics  . Alcohol use: Not Currently  . Drug use: Never    Patient unable to complete review of Systems      ____________________________________________    PHYSICAL EXAM:  VITAL SIGNS: ED Triage Vitals  Enc Vitals Group     BP 04/28/19 1609 117/62     Pulse Rate 04/28/19 1609 61     Resp 04/28/19 1609 16     Temp 04/28/19 1609 98 F (36.7 C)     Temp Source 04/28/19 1609 Oral     SpO2 04/28/19 1609 98 %     Weight 04/28/19 1610 49.9 kg (110 lb)     Height 04/28/19 1610 1.676 m (5\' 6" )     Head Circumference --      Peak Flow --      Pain Score 04/28/19 1610 0     Pain Loc --      Pain Edu? --      Excl. in [place]? --      Constitutional: Alert but disoriented  Nose: No congestion/rhinnorhea. Mouth/Throat: Mucous membranes are moist.   Cardiovascular: Normal rate, regular rhythm.  Respiratory: Normal respiratory effort.  No retractions.  Clear auscultation bilaterally  Musculoskeletal: No lower extremity tenderness nor edema.   Neurologic:  Normal speech and language. No gross focal neurologic deficits are appreciated.   Skin:  Skin is warm, dry and intact. No rash noted.   ____________________________________________   LABS (all labs ordered are listed, but only abnormal results are displayed)  Labs Reviewed  POC SARS CORONAVIRUS 2 AG -  ED  POC SARS CORONAVIRUS 2 AG   ____________________________________________  EKG   ____________________________________________  RADIOLOGY  None ____________________________________________   PROCEDURES  Procedure(s) performed: No  Procedures   Critical Care performed: No ____________________________________________   INITIAL IMPRESSION / ASSESSMENT AND PLAN / ED COURSE  Pertinent labs & imaging results that were available during my care of the patient were reviewed by me and considered in my medical decision making (see chart for details).  Patient well-appearing and in no acute distress, oxygen saturations respiratory rate, pulse rate are all normal in the emergency department, she is afebrile.  Rapid antigen test is negative, appropriate for discharge at this  time   ____________________________________________   FINAL CLINICAL IMPRESSION(S) / ED DIAGNOSES  Final diagnoses:  Encounter for laboratory testing for COVID-19 virus      NEW MEDICATIONS STARTED DURING THIS VISIT:  New Prescriptions   No medications on file     Note:  This document was prepared using Dragon voice recognition software and may include unintentional dictation errors.   Lavonia Drafts, MD 04/28/19 [phone]

## 2019-04-28 NOTE — ED Notes (Signed)
Attempted to call [facility] to update them with no answer.

## 2019-04-28 NOTE — ED Notes (Signed)
Spoke with Toniann Fail - at [phone]. She will come at get the pt and [place] her [place].

## 2019-04-28 NOTE — ED Triage Notes (Addendum)
t arrived via EMS from [facility] - Nursing home stated that O2 sat was low at 82% - EMS got 95% and in triage O2 sat was 98% - Pt is alert/can answer questions appropriately and reports that she has no illness symptoms - pt states she does not know why she is here that she feels fine - nsg home request that rapid covid test be done Pt leans to the left in wheelchair at baseline

## 2019-04-28 NOTE — Discharge Instructions (Addendum)
Rapid antigen Covid test was negative

## 2019-05-24 ENCOUNTER — Emergency Department: Payer: Medicare PPO

## 2019-05-24 ENCOUNTER — Inpatient Hospital Stay
Admit: 2019-05-24 | Discharge: 2019-05-29 | DRG: 175 | Disposition: A | Discharge status: Home Health | Payer: Medicare PPO | Source: Skilled Nursing Facility | Attending: Internal Medicine | Admitting: Internal Medicine

## 2019-05-24 ENCOUNTER — Other Ambulatory Visit: Payer: Self-pay

## 2019-05-24 DIAGNOSIS — M810 Age-related osteoporosis without current pathological fracture: Secondary | ICD-10-CM | POA: Diagnosis present

## 2019-05-24 DIAGNOSIS — Z794 Long term (current) use of insulin: Secondary | ICD-10-CM | POA: Diagnosis not present

## 2019-05-24 DIAGNOSIS — E119 Type 2 diabetes mellitus without complications: Secondary | ICD-10-CM

## 2019-05-24 DIAGNOSIS — R55 Syncope and collapse: Secondary | ICD-10-CM | POA: Diagnosis not present

## 2019-05-24 DIAGNOSIS — N189 Chronic kidney disease, unspecified: Secondary | ICD-10-CM

## 2019-05-24 DIAGNOSIS — E876 Hypokalemia: Secondary | ICD-10-CM | POA: Diagnosis present

## 2019-05-24 DIAGNOSIS — Z87891 Personal history of nicotine dependence: Secondary | ICD-10-CM | POA: Diagnosis not present

## 2019-05-24 DIAGNOSIS — Z886 Allergy status to analgesic agent status: Secondary | ICD-10-CM

## 2019-05-24 DIAGNOSIS — Y92230 Patient room in hospital as the place of occurrence of the external cause: Secondary | ICD-10-CM | POA: Diagnosis not present

## 2019-05-24 DIAGNOSIS — Z20822 Contact with and (suspected) exposure to covid-19: Secondary | ICD-10-CM | POA: Diagnosis present

## 2019-05-24 DIAGNOSIS — E785 Hyperlipidemia, unspecified: Secondary | ICD-10-CM | POA: Diagnosis present

## 2019-05-24 DIAGNOSIS — I2699 Other pulmonary embolism without acute cor pulmonale: Principal | ICD-10-CM | POA: Diagnosis present

## 2019-05-24 DIAGNOSIS — J9601 Acute respiratory failure with hypoxia: Secondary | ICD-10-CM | POA: Diagnosis present

## 2019-05-24 DIAGNOSIS — K921 Melena: Secondary | ICD-10-CM | POA: Diagnosis not present

## 2019-05-24 DIAGNOSIS — F419 Anxiety disorder, unspecified: Secondary | ICD-10-CM | POA: Diagnosis present

## 2019-05-24 DIAGNOSIS — I5032 Chronic diastolic (congestive) heart failure: Secondary | ICD-10-CM

## 2019-05-24 DIAGNOSIS — N1831 Chronic kidney disease, stage 3a: Secondary | ICD-10-CM | POA: Diagnosis present

## 2019-05-24 DIAGNOSIS — E1122 Type 2 diabetes mellitus with diabetic chronic kidney disease: Secondary | ICD-10-CM | POA: Diagnosis present

## 2019-05-24 DIAGNOSIS — R197 Diarrhea, unspecified: Secondary | ICD-10-CM | POA: Diagnosis present

## 2019-05-24 DIAGNOSIS — Z79899 Other long term (current) drug therapy: Secondary | ICD-10-CM

## 2019-05-24 DIAGNOSIS — I959 Hypotension, unspecified: Secondary | ICD-10-CM | POA: Diagnosis present

## 2019-05-24 DIAGNOSIS — R296 Repeated falls: Secondary | ICD-10-CM | POA: Diagnosis present

## 2019-05-24 DIAGNOSIS — Z7983 Long term (current) use of bisphosphonates: Secondary | ICD-10-CM

## 2019-05-24 DIAGNOSIS — F0150 Vascular dementia without behavioral disturbance: Secondary | ICD-10-CM | POA: Diagnosis present

## 2019-05-24 DIAGNOSIS — R0902 Hypoxemia: Secondary | ICD-10-CM | POA: Diagnosis present

## 2019-05-24 DIAGNOSIS — I2602 Saddle embolus of pulmonary artery with acute cor pulmonale: Secondary | ICD-10-CM | POA: Diagnosis not present

## 2019-05-24 DIAGNOSIS — F329 Major depressive disorder, single episode, unspecified: Secondary | ICD-10-CM | POA: Diagnosis present

## 2019-05-24 DIAGNOSIS — T45515A Adverse effect of anticoagulants, initial encounter: Secondary | ICD-10-CM | POA: Diagnosis not present

## 2019-05-24 DIAGNOSIS — D6832 Hemorrhagic disorder due to extrinsic circulating anticoagulants: Secondary | ICD-10-CM | POA: Diagnosis not present

## 2019-05-24 DIAGNOSIS — N183 Chronic kidney disease, stage 3 unspecified: Secondary | ICD-10-CM | POA: Diagnosis present

## 2019-05-24 DIAGNOSIS — I2692 Saddle embolus of pulmonary artery without acute cor pulmonale: Secondary | ICD-10-CM | POA: Diagnosis not present

## 2019-05-24 DIAGNOSIS — F0390 Unspecified dementia without behavioral disturbance: Secondary | ICD-10-CM | POA: Diagnosis present

## 2019-05-24 LAB — CBC
HCT: 34.2 % — ABNORMAL LOW (ref 36.0–46.0)
Hemoglobin: 11.3 g/dL — ABNORMAL LOW (ref 12.0–15.0)
MCH: 29 pg (ref 26.0–34.0)
MCHC: 33 g/dL (ref 30.0–36.0)
MCV: 87.7 fL (ref 80.0–100.0)
Platelets: 182 10*3/uL (ref 150–400)
RBC: 3.9 MIL/uL (ref 3.87–5.11)
RDW: 13.7 % (ref 11.5–15.5)
WBC: 10.2 10*3/uL (ref 4.0–10.5)
nRBC: 0 % (ref 0.0–0.2)

## 2019-05-24 LAB — BASIC METABOLIC PANEL
Anion gap: 11 (ref 5–15)
BUN: 17 mg/dL (ref 8–23)
CO2: 29 mmol/L (ref 22–32)
Calcium: 8.5 mg/dL — ABNORMAL LOW (ref 8.9–10.3)
Chloride: 100 mmol/L (ref 98–111)
Creatinine, Ser: 1.32 mg/dL — ABNORMAL HIGH (ref 0.44–1.00)
GFR calc Af Amer: 43 mL/min — ABNORMAL LOW (ref >60)
GFR calc non Af Amer: 37 mL/min — ABNORMAL LOW (ref >60)
Glucose, Bld: 175 mg/dL — ABNORMAL HIGH (ref 70–99)
Potassium: 2.8 mmol/L — ABNORMAL LOW (ref 3.5–5.1)
Sodium: 140 mmol/L (ref 135–145)

## 2019-05-24 LAB — PROTIME-INR
INR: 1.1 (ref 0.8–1.2)
Prothrombin Time: 14.5 seconds (ref 11.4–15.2)

## 2019-05-24 LAB — GLUCOSE, CAPILLARY: Glucose-Capillary: 147 mg/dL — ABNORMAL HIGH (ref 70–99)

## 2019-05-24 LAB — BRAIN NATRIURETIC PEPTIDE: B Natriuretic Peptide: 131 pg/mL — ABNORMAL HIGH (ref 0.0–100.0)

## 2019-05-24 LAB — APTT: aPTT: 32 seconds (ref 24–36)

## 2019-05-24 LAB — HEPARIN LEVEL (UNFRACTIONATED): Heparin Unfractionated: <0.1 IU/mL — ABNORMAL LOW (ref 0.30–0.70)

## 2019-05-24 LAB — TROPONIN I (HIGH SENSITIVITY): Troponin I (High Sensitivity): 81 ng/L — ABNORMAL HIGH (ref <18)

## 2019-05-24 MED ORDER — IOHEXOL 350 MG/ML SOLN
60.0000 mL | Freq: Once | INTRAVENOUS | Status: AC | PRN
  Administered 2019-05-24: 60 mL via INTRAVENOUS

## 2019-05-24 MED ORDER — SODIUM CHLORIDE 0.9 % IV BOLUS
500.0000 mL | Freq: Once | INTRAVENOUS | Status: AC
  Administered 2019-05-24: 500 mL via INTRAVENOUS

## 2019-05-24 MED ORDER — POTASSIUM CHLORIDE CRYS ER 20 MEQ PO TBCR
40.0000 meq | EXTENDED_RELEASE_TABLET | Freq: Once | ORAL | Status: AC
  Administered 2019-05-24: 40 meq via ORAL
  Filled 2019-05-24: qty 2

## 2019-05-24 MED ORDER — SODIUM CHLORIDE 0.9 % IV BOLUS
1000.0000 mL | Freq: Once | INTRAVENOUS | Status: AC
  Administered 2019-05-24: 1000 mL via INTRAVENOUS

## 2019-05-24 MED ORDER — CHLORHEXIDINE GLUCONATE CLOTH 2 % EX PADS
6.0000 | MEDICATED_PAD | Freq: Every day | CUTANEOUS | Status: DC
  Administered 2019-05-25: 6 via TOPICAL
  Filled 2019-05-24: qty 6

## 2019-05-24 MED ORDER — HEPARIN (PORCINE) 25000 UT/250ML-% IV SOLN
650.0000 [IU]/h | INTRAVENOUS | Status: DC
  Administered 2019-05-24: 950 [IU]/h via INTRAVENOUS
  Administered 2019-05-25: 650 [IU]/h via INTRAVENOUS
  Filled 2019-05-24 (×2): qty 250

## 2019-05-24 NOTE — ED Notes (Signed)
Contacted patients daughter, Toniann Fail, informed of bed assignment.

## 2019-05-24 NOTE — ED Notes (Signed)
2nd IV placed due to patient unable to keep arm straight for proper flow of fluids. Patient educated on need to keep arm straight 7 times but unable to remember,

## 2019-05-24 NOTE — Progress Notes (Addendum)
ANTICOAGULATION CONSULT NOTE - Initial Consult  Pharmacy Consult for heparin Indication: pulmonary embolus  Allergies  Allergen Reactions  . Naproxen Hives and Shortness Of Breath    Patient Measurements: Height: 5\' 2"  (157.5 cm) Weight: 121 lb 7.6 oz (55.1 kg) IBW/kg (Calculated) : 50.1 Heparin Dosing Weight: 55 kg  Vital Signs: Temp: 97.6 F (36.4 C) (02/26 1917) Temp Source: Oral (02/26 1917) BP: 94/58 (02/26 2130) Pulse Rate: 77 (02/26 2130)  Labs: Recent Labs    05/24/19 1922  HGB 11.3*  HCT 34.2*  PLT 182  CREATININE 1.32*    Estimated Creatinine Clearance: 24.6 mL/min (A) (by C-G formula based on SCr of 1.32 mg/dL (H)).   Medical History: Past Medical History:  Diagnosis Date  . CHF (congestive heart failure) (HCC)   . Diabetes mellitus without complication (HCC)   . Left ventricular failure (HCC)   . Osteoporosis   . Pulmonary embolism (HCC)   . Pulmonary embolus (HCC)   . Vascular dementia (HCC)     Medications:  Scheduled:    Assessment: Patient arrives s/t fall from toilet at [facility] house w/ syncopal episode. Patient w/ h/o DM, CHF, PE admitted w/ PE on CT w/ R heart strain. Patient is on eliquis 2.5 mg bid PTA, last dose not recorded. Patient is being started on a heparin drip for management of pulmonary embolism, baseline CBC WNL, baseline labs pending.  Goal of Therapy:  Heparin level 0.3-0.7 units/ml Monitor platelets by anticoagulation protocol: Yes   Plan:  Will omit heparin bolus for now. Will start rate at 950 units/hr. Will check anti-Xa at 0700. Will monitor daily CBC's and adjust per anti-Xa levels.  02/27 @ 0000 will add a heparin bolus of 3000 units IV x 1 considering baseline labs WNL and baseline HL < 0.10. will continue to monitor.  3/27, PharmD, BCPS Clinical Pharmacist 05/24/2019,10:18 PM

## 2019-05-24 NOTE — ED Notes (Signed)
Patient is resting quietly in bed at this time. Patient has blankets over her, patient states she is going to sleep. Call light in reach

## 2019-05-24 NOTE — ED Notes (Signed)
Spoke to pt daughter [phone], Toniann Fail.

## 2019-05-24 NOTE — ED Notes (Signed)
Patient resting quietly in bed. Has not produced urine yet.

## 2019-05-24 NOTE — ED Notes (Signed)
XRAY at Bedside.  

## 2019-05-24 NOTE — ED Notes (Signed)
PurWick placed on PT. Assisted by Idalia Needle, RN

## 2019-05-24 NOTE — ED Notes (Signed)
PT to CT.

## 2019-05-24 NOTE — ED Notes (Signed)
Patient resting in bed at this time. PT provided warm blankets and has call light in reach.

## 2019-05-24 NOTE — ED Provider Notes (Signed)
[facility] Emergency Department Provider Note  ____________________________________________   First MD Initiated Contact with Patient 05/24/19 1928     (approximate)  I have reviewed the triage vital signs and the nursing notes.  History  Chief Complaint Loss of Consciousness    HPI Jasmine Hubbard is a 86 y.o. female with hx dHF, PE (recently taken off anticoagulation due to frequent falls), vascular dementia, who presents to the ED for syncope.   Per report, patient has had several days of ongoing diarrhea.  She was observed to have a witnessed episode of syncope while sitting on the toilet having diarrhea.  No falls, no head injuries, no related trauma reported.  On arrival to the emergency department, patient is pleasantly demented, has no acute complaints, cannot recall the incident.  Caveat: History primarily obtained from EMS due to patient's dementia.   Past Medical Hx Past Medical History:  Diagnosis Date  . CHF (congestive heart failure) (HCC)   . Diabetes mellitus without complication (HCC)   . Left ventricular failure (HCC)   . Osteoporosis   . Pulmonary embolism (HCC)   . Pulmonary embolus (HCC)   . Vascular dementia [facility])     Problem List Patient Active Problem List   Diagnosis Date Noted  . Dementia (HCC) 04/12/2019  . Acute metabolic encephalopathy 04/12/2019  . Diabetes (HCC) 04/12/2019  . Chronic diastolic CHF (congestive heart failure) (HCC) 04/12/2019  . UTI (urinary tract infection) 04/12/2019    Past Surgical Hx Past Surgical History:  Procedure Laterality Date  . ABDOMINAL HYSTERECTOMY    . BACK SURGERY    . FRACTURE SURGERY     elbow    Medications Prior to Admission medications   Medication Sig Start Date End Date Taking? Authorizing Provider  acetaminophen (TYLENOL) 325 MG tablet Take 650 mg by mouth every 6 (six) hours as needed for mild pain.    [provider]  alendronate (FOSAMAX)  70 MG tablet Take 70 mg by mouth every Friday. Take with a full glass of water on an empty stomach.     [provider]  alum & mag hydroxide-simeth (MINTOX) 200-200-20 MG/5ML suspension Take 30 mLs by mouth every 6 (six) hours as needed for indigestion or heartburn.    [provider]  apixaban (ELIQUIS) 2.5 MG TABS tablet Take 2.5 mg by mouth 2 (two) times daily.     [provider]  Calcium Carb-Cholecalciferol 600-400 MG-UNIT TABS Take 1 tablet by mouth 2 (two) times a day.     [provider]  Calcium Citrate-Vitamin D 250-200 MG-UNIT TABS Take 2 tablets by mouth at bedtime.     [provider]  citalopram (CELEXA) 10 MG tablet Take 10 mg by mouth daily.    [provider]  furosemide (LASIX) 20 MG tablet Take 20 mg by mouth daily.     [provider]  guaifenesin (ROBITUSSIN) 100 MG/5ML syrup Take 200 mg by mouth every 6 (six) hours as needed for cough or congestion. (max 4 doses daily)    [provider]  insulin aspart (NOVOLOG) 100 UNIT/ML injection Inject 0-9 Units into the skin 3 (three) times daily with meals. 04/16/19   Thomasenia Bottoms, MD  insulin aspart (NOVOLOG) 100 UNIT/ML injection Inject 0-5 Units into the skin at bedtime. 04/16/19   Thomasenia Bottoms, MD  lovastatin (MEVACOR) 20 MG tablet Take 40 mg by mouth at bedtime.    [provider]  magnesium hydroxide (MILK OF MAGNESIA)  400 MG/5ML suspension Take 30 mLs by mouth daily as needed for mild constipation.    [provider]  neomycin-bacitracin-polymyxin (NEOSPORIN) OINT Apply 1 application topically 4 (four) times daily as needed for wound care.    [provider]  Saccharomyces boulardii (PROBIOTIC) 250 MG CAPS Take 1 each by mouth 2 (two) times daily. 04/16/19   Thornell Mule, MD  sodium chloride (OCEAN) 0.65 % SOLN nasal spray Place 1 spray into both nostrils 3 (three) times daily.     [provider]     Allergies Naproxen  Family Hx History reviewed. No pertinent family history.  Social Hx Social History   Tobacco Use  . Smoking status: Former Smoker    Types: Cigarettes    Quit date: 08/28/1999    Years since quitting: 19.7  . Smokeless tobacco: Never Used  Substance Use Topics  . Alcohol use: Not Currently  . Drug use: Never     Review of Systems Unable to obtain due to patient's dementia.  Physical Exam  Vital Signs: ED Triage Vitals  Enc Vitals Group     BP 05/24/19 1917 (!) 92/54     Pulse Rate 05/24/19 1917 97     Resp 05/24/19 1917 18     Temp 05/24/19 1917 97.6 F (36.4 C)     Temp Source 05/24/19 1917 Oral     SpO2 05/24/19 1917 95 %     Weight 05/24/19 1917 121 lb 7.6 oz (55.1 kg)     Height 05/24/19 1917 5\' 2"  (1.575 m)     Head Circumference --      Peak Flow --      Pain Score 05/24/19 1916 0     Pain Loc --      Pain Edu? --      Excl. in [place]? --     Constitutional: Awake, alert, pleasantly demented. Head: Normocephalic. Atraumatic. Eyes: Conjunctivae clear. Sclera anicteric. Nose: No congestion. No rhinorrhea. Mouth/Throat: Wearing mask.  MM slightly dry. Neck: No stridor.   Cardiovascular: Normal rate, regular rhythm. Extremities well perfused. Respiratory: Normal respiratory effort.  Lungs CTAB.  Initially 95% on room air, then during ED course oxygen saturation dropped (with a good waveform) to the mid 80s, placed on 2 L nasal cannula with appropriate increase to the mid to upper 90s. Gastrointestinal: Soft. Non-tender. Non-distended.  Musculoskeletal: No lower extremity edema. No deformities. Neurologic: Consistent with history of dementia.  No gross focal neurologic deficits are appreciated.  Skin: Skin is warm, dry and intact. No rash noted. Psychiatric: Mood and affect are appropriate for situation.  EKG  Personally reviewed.   Rate: 88 Rhythm: sinus with PAC Axis: LAD Intervals: QTc 495 ms Non-specific ST/T wave changes in  the lateral and inferior leads No STEMI    Radiology  CTA Chest: IMPRESSION:  1. Diffuse bilateral pulmonary emboli with CTevidence of right heart  strain (RV/LV Ratio = 1.6) consistent with at least submassive  (intermediate risk) PE. The presence of right heart strain has been  associated with an increased risk of morbidity and mortality.  2. Aortic Atherosclerosis (ICD10-I70.0).  3. Emphysema (ICD10-J43.9).    Procedures  Procedure(s) performed (including critical care):  .Critical Care Performed by: Lilia Pro., MD Authorized by: Lilia Pro., MD   Critical care provider statement:    Critical care time (minutes):  45   Critical care was necessary to treat or prevent imminent or life-threatening deterioration of the following conditions: PE on heparin, syncope.  Critical care was time spent personally by me on the following activities:  Discussions with consultants, evaluation of patient's response to treatment, examination of patient, ordering and performing treatments and interventions, ordering and review of laboratory studies, ordering and review of radiographic studies, pulse oximetry, re-evaluation of patient's condition, obtaining history from patient or surrogate and review of old charts     Initial Impression / Assessment and Plan / ED Course  86 y.o. female who presents to the ED for syncope, as above  Ddx: vasovagal, dehydration in the setting of diarrhea, arrhythmia, PE, COVID  Will obtain labs, imaging.  Confirmed with patient's daughter that she was recently taken off anticoagulation due to frequent falls.  Given her mild hypotension and now new 2 L oxygen requirement, will obtain CT PE for further evaluation  CT positive for diffuse bilateral PE with imaging findings concerning for right heart strain.  Will initiate heparin drip.  Coags, BNP, troponin added on.  BP remains 90-100 systolic. Stable on 2 L [place]. Discussed with hospitalist for  admission.   Final Clinical Impression(s) / ED Diagnosis  Final diagnoses:  Acute pulmonary embolism, unspecified pulmonary embolism type, unspecified whether acute cor pulmonale present (HCC)  Syncope, unspecified syncope type       Note:  This document was prepared using Dragon voice recognition software and may include unintentional dictation errors.   Miguel Aschoff., MD 05/25/19 [phone]

## 2019-05-24 NOTE — H&P (Signed)
History and Physical        Hospital Admission Note Date: 05/24/2019  Patient name: Jasmine Hubbard Medical record number: [id number] Date of birth: July 09, 1931 Age: 86 y.o. Gender: female  PCP: [name], MD    Patient coming from: [facility]   I have reviewed all records in the [facility].    Chief Complaint:  Syncope   HPI: Jasmine Hubbard is a 86 y.o. female with PMH of dementia, PE (recently taken off Eliquis due to frequent falls), DM, diastolic HF who presents from nursing home for syncopal episode while sitting on toilet experiencing diarrhea. Per ER notes, staff reported patient did not hit her head. While in ER, was found to have PE with evidence of right heart strain.   History is limited to patient's dementia. Patient reports she is feeling fine and would like to go back to sleep. Denies chest pain and SOB.    ED work-up/course:  86 y.o. female who presents to the ED for syncope, as above  Ddx: vasovagal, dehydration in the setting of diarrhea, arrhythmia, PE, COVID  Will obtain labs, imaging.  Confirmed with patient's daughter that she was recently taken off anticoagulation due to frequent falls.  Given her mild hypotension and now new 2 L oxygen requirement, will obtain CT PE for further evaluation  CT positive for diffuse bilateral PE with imaging findings concerning for right heart strain.  Will initiate heparin drip.  Coags, BNP, troponin added on.  BP remains 90-100 systolic. Stable on 2 L [place]. Discussed with hospitalist for admission.   Review of Systems: Positives marked in 'bold' Unable to obtain due to dementia.   Past Medical History: Past Medical History:  Diagnosis Date  . CHF (congestive heart failure) (HCC)   . Diabetes mellitus without complication (HCC)   . Left ventricular failure (HCC)   . Osteoporosis   . Pulmonary embolism (HCC)   . Pulmonary  embolus (HCC)   . Vascular dementia [facility])     Past Surgical History:  Procedure Laterality Date  . ABDOMINAL HYSTERECTOMY    . BACK SURGERY    . FRACTURE SURGERY     elbow    Medications: Prior to Admission medications   Medication Sig Start Date End Date Taking? Authorizing Provider  acetaminophen (TYLENOL) 325 MG tablet Take 650 mg by mouth every 6 (six) hours as needed for mild pain.    [provider]  alendronate (FOSAMAX) 70 MG tablet Take 70 mg by mouth every Friday. Take with a full glass of water on an empty stomach.     [provider]  alum & mag hydroxide-simeth (MINTOX) 200-200-20 MG/5ML suspension Take 30 mLs by mouth every 6 (six) hours as needed for indigestion or heartburn.    [provider]  apixaban (ELIQUIS) 2.5 MG TABS tablet Take 2.5 mg by mouth 2 (two) times daily.     [provider]  Calcium Carb-Cholecalciferol 600-400 MG-UNIT TABS Take 1 tablet by mouth 2 (two) times a day.     [provider]  Calcium Citrate-Vitamin D 250-200 MG-UNIT TABS Take 2 tablets by mouth at bedtime.  [provider]  citalopram (CELEXA) 10 MG tablet Take 10 mg by mouth daily.    [provider]  furosemide (LASIX) 20 MG tablet Take 20 mg by mouth daily.     [provider]  guaifenesin (ROBITUSSIN) 100 MG/5ML syrup Take 200 mg by mouth every 6 (six) hours as needed for cough or congestion. (max 4 doses daily)    [provider]  insulin aspart (NOVOLOG) 100 UNIT/ML injection Inject 0-9 Units into the skin 3 (three) times daily with meals. 04/16/19   Thomasenia Bottoms, MD  insulin aspart (NOVOLOG) 100 UNIT/ML injection Inject 0-5 Units into the skin at bedtime. 04/16/19   Thomasenia Bottoms, MD  lovastatin (MEVACOR) 20 MG tablet Take 40 mg by mouth at bedtime.    [provider]  magnesium hydroxide (MILK OF MAGNESIA) 400 MG/5ML suspension Take 30 mLs by mouth daily as needed for mild constipation.     [provider]  neomycin-bacitracin-polymyxin (NEOSPORIN) OINT Apply 1 application topically 4 (four) times daily as needed for wound care.    [provider]  Saccharomyces boulardii (PROBIOTIC) 250 MG CAPS Take 1 each by mouth 2 (two) times daily. 04/16/19   Thomasenia Bottoms, MD  sodium chloride (OCEAN) 0.65 % SOLN nasal spray Place 1 spray into both nostrils 3 (three) times daily.     [provider]    Allergies:   Allergies  Allergen Reactions  . Naproxen Hives and Shortness Of Breath    Social History:  reports that she quit smoking about 19 years ago. Her smoking use included cigarettes. She has never used smokeless tobacco. She reports previous alcohol use. She reports that she does not use drugs.  Family History: History reviewed. No pertinent family history.  Physical Exam: Blood pressure (!) 94/58, pulse 77, temperature 97.6 F (36.4 C), temperature source Oral, resp. rate 19, height 5\' 2"  (1.575 m), weight 55.1 kg, SpO2 96 %. General: Alert, awake, in no acute distress. Pleasantly demented.  Eyes: pink conjunctiva,anicteric sclera, pupils equal and reactive to light and accomodation, HEENT: normocephalic, atraumatic, oropharynx clear Neck: supple, no masses or lymphadenopathy, no goiter, no bruits, no JVD CVS: Regular rate and rhythm, without murmurs, rubs or gallops. No lower extremity edema Resp : Clear to auscultation bilaterally, no wheezing, rales or rhonchi. [place] in place.  GI : Soft, nontender, nondistended, positive bowel sounds, no masses. No hepatomegaly. No hernia.  Musculoskeletal: No clubbing or cyanosis, positive pedal pulses. No contracture. ROM intact  Neuro: Grossly intact, no focal neurological deficits, strength 5/5 upper and lower extremities bilaterally Psych: alert and oriented to self, normal mood and affect Skin: no rashes or lesions, warm and dry   LABS on Admission: I have personally reviewed all the labs and imagings  below    Basic Metabolic Panel: Recent Labs  Lab 05/24/19 1922  NA 140  K 2.8*  CL 100  CO2 29  GLUCOSE 175*  BUN 17  CREATININE 1.32*  CALCIUM 8.5*   Liver Function Tests: No results for input(s): AST, ALT, ALKPHOS, BILITOT, PROT, ALBUMIN in the last 168 hours. No results for input(s): LIPASE, AMYLASE in the last 168 hours. No results for input(s): AMMONIA in the last 168 hours. CBC: Recent Labs  Lab 05/24/19 1922  WBC 10.2  HGB 11.3*  HCT 34.2*  MCV 87.7  PLT 182   Cardiac Enzymes: No results for input(s): CKTOTAL, CKMB, CKMBINDEX, TROPONINI in the last 168 hours. BNP: Invalid input(s): POCBNP CBG: Recent Labs  Lab  05/24/19 1929  GLUCAP 147*    Radiological Exams on Admission:  DG Chest 1 View  Result Date: 05/24/2019 CLINICAL DATA:  Syncope EXAM: CHEST  1 VIEW COMPARISON:  CT same day, April 12, 2019 FINDINGS: The heart size and mediastinal contours are within normal limits. Aortic knob calcifications. Both lungs are clear. The visualized skeletal structures are unremarkable. IMPRESSION: No active disease. Electronically Signed   By: Jonna Clark M.D.   On: 05/24/2019 22:22   CT Angio Chest PE W/Cm &/Or Wo Cm  Result Date: 05/24/2019 CLINICAL DATA:  Hypoxia, syncope EXAM: CT ANGIOGRAPHY CHEST WITH CONTRAST TECHNIQUE: Multidetector CT imaging of the chest was performed using the standard protocol during bolus administration of intravenous contrast. Multiplanar CT image reconstructions and MIPs were obtained to evaluate the vascular anatomy. CONTRAST:  36mL OMNIPAQUE IOHEXOL 350 MG/ML SOLN COMPARISON:  04/12/2019 FINDINGS: Cardiovascular: This is a technically adequate evaluation of the pulmonary vasculature. Diffuse bilateral pulmonary emboli are seen within all lobar distributions, as well as a large embolus persisting in the right main pulmonary artery. Significant clot burden. Dilated right ventricle with RV/LV ratio measuring 1.6. The heart is not enlarged.  Significant atherosclerosis of the thoracic aorta. Aberrant origin of the right subclavian artery is noted, a frequent anatomic variant. No evidence of thoracic aortic aneurysm or dissection. Mediastinum/Nodes: No enlarged mediastinal, hilar, or axillary lymph nodes. Thyroid gland, trachea, and esophagus demonstrate no significant findings. Lungs/Pleura: Mild upper lobe predominant emphysema. No airspace disease, effusion, or pneumothorax. The central airways are patent. Upper Abdomen: No acute abnormality. Musculoskeletal: No acute or destructive bony lesions. Reconstructed images demonstrate no additional findings. Review of the MIP images confirms the above findings. IMPRESSION: 1. Diffuse bilateral pulmonary emboli with CTevidence of right heart strain (RV/LV Ratio = 1.6) consistent with at least submassive (intermediate risk) PE. The presence of right heart strain has been associated with an increased risk of morbidity and mortality. 2.  Aortic Atherosclerosis (ICD10-I70.0). 3. Emphysema (ICD10-J43.9). These results were called by telephone at the time of interpretation on 05/24/2019 at 9:56 pm to provider [facility] , who verbally acknowledged these results. Electronically Signed   By: Sharlet Salina M.D.   On: 05/24/2019 21:56      EKG: Independently reviewed. Sinus rhythm with PAC. Nonspecific ST/T wave changes. No STEMI.    Assessment/Plan Active Problems:   Dementia (HCC)   Diabetes (HCC)   Chronic diastolic CHF (congestive heart failure) (HCC)   Pulmonary embolism (HCC)   Hypoxia   Hypokalemia   CKD (chronic kidney disease)  Pulmonary Embolism  CTA chest showed diffuse bilateral pulmonary emboli with CTevidence of right heart strain consistent with at least submassive (intermediate risk) PE. Started on Heparin drip in ED. Patient was recently taken off Eliquis due to history of recurrent falls. Maintaining appropriate O2 saturations with 2L [place]. Vitals remain stable otherwise.  -admit  to SDU, cardiac monitoring and continuous pulse ox  -wean off [place] as appropriate  -Heparin drip per pharmacy, will need to transition to oral anticoagulation  -troponin and BNP pending due to evidence of right heart strain   Hypokalemia  K 2.8 at admission. May be related to diarrhea and worsened by chronic Lasix use.  -Kdur x3 for repletion  -repeat in AM  -check Mg level  -hold Lasix for now   HFpEF  Appears euvolemic.  -hold Lasix for now given hypokalemia   DM  Patient on SSI at nursing home.  -sensitive SSI  -CBGs AC/HS  CKD  Cr 1.3 at admission, appears consistent with prior values.  -avoid nephrotoxic agents  -BMET in AM    DVT prophylaxis: Full Dose Heparin   CODE STATUS: FULL   Consults called: None   Admission status: Inpatient   The medical decision making on this patient was of high complexity and the patient is at high risk for clinical deterioration, therefore this is a level 3 admission.  Severity of Illness:     Moderate  The appropriate patient status for this patient is INPATIENT. Inpatient status is judged to be reasonable and necessary in order to provide the required intensity of service to ensure the patient's safety. The patient's presenting symptoms, physical exam findings, and initial radiographic and laboratory data in the context of their chronic comorbidities is felt to place them at high risk for further clinical deterioration. Furthermore, it is not anticipated that the patient will be medically stable for discharge from the hospital within 2 midnights of admission. The following factors support the patient status of inpatient.   " The patient's presenting symptoms include syncope. " The worrisome physical exam findings include hypoxia. " The initial radiographic and laboratory data are worrisome because of PE on CTA, hypokalemia. " The chronic co-morbidities include dementia, prior history of PE, DM.   * I certify that at the point of  admission it is my clinical judgment that the patient will require inpatient hospital care spanning beyond 2 midnights from the point of admission due to high intensity of service, high risk for further deterioration and high frequency of surveillance required.*    Time Spent on Admission: 48 minutes      Melina Schools D.O.  [facility] 05/24/2019, 10:26 PM

## 2019-05-24 NOTE — ED Triage Notes (Addendum)
Patient arrives via EMS due to a syncopal episode at [facility] while sitting on the toilet experiencing diarrhea.  Patient was with staff when this occurred and did not fall nor hit her head. Patient is alert but oriented to none which is her reported baseline. Patient is reported to be experiencing diarrhea today

## 2019-05-25 DIAGNOSIS — I2602 Saddle embolus of pulmonary artery with acute cor pulmonale: Secondary | ICD-10-CM

## 2019-05-25 LAB — RESPIRATORY PANEL BY RT PCR (FLU A&B, COVID)
Influenza A by PCR: NEGATIVE
Influenza B by PCR: NEGATIVE
SARS Coronavirus 2 by RT PCR: NEGATIVE

## 2019-05-25 LAB — BASIC METABOLIC PANEL
Anion gap: 6 (ref 5–15)
BUN: 15 mg/dL (ref 8–23)
CO2: 29 mmol/L (ref 22–32)
Calcium: 7.9 mg/dL — ABNORMAL LOW (ref 8.9–10.3)
Chloride: 107 mmol/L (ref 98–111)
Creatinine, Ser: 1.16 mg/dL — ABNORMAL HIGH (ref 0.44–1.00)
GFR calc Af Amer: 50 mL/min — ABNORMAL LOW (ref >60)
GFR calc non Af Amer: 43 mL/min — ABNORMAL LOW (ref >60)
Glucose, Bld: 111 mg/dL — ABNORMAL HIGH (ref 70–99)
Potassium: 3.5 mmol/L (ref 3.5–5.1)
Sodium: 142 mmol/L (ref 135–145)

## 2019-05-25 LAB — URINALYSIS, COMPLETE (UACMP) WITH MICROSCOPIC
Bilirubin Urine: NEGATIVE
Glucose, UA: NEGATIVE mg/dL
Hgb urine dipstick: NEGATIVE
Ketones, ur: NEGATIVE mg/dL
Nitrite: NEGATIVE
Protein, ur: NEGATIVE mg/dL
Specific Gravity, Urine: >1.046 — ABNORMAL HIGH (ref 1.005–1.030)
pH: 5 (ref 5.0–8.0)

## 2019-05-25 LAB — CBC
HCT: 27.4 % — ABNORMAL LOW (ref 36.0–46.0)
Hemoglobin: 9.1 g/dL — ABNORMAL LOW (ref 12.0–15.0)
MCH: 29.1 pg (ref 26.0–34.0)
MCHC: 33.2 g/dL (ref 30.0–36.0)
MCV: 87.5 fL (ref 80.0–100.0)
Platelets: 156 10*3/uL (ref 150–400)
RBC: 3.13 MIL/uL — ABNORMAL LOW (ref 3.87–5.11)
RDW: 13.7 % (ref 11.5–15.5)
WBC: 7.7 10*3/uL (ref 4.0–10.5)
nRBC: 0 % (ref 0.0–0.2)

## 2019-05-25 LAB — HEPARIN LEVEL (UNFRACTIONATED)
Heparin Unfractionated: 0.96 IU/mL — ABNORMAL HIGH (ref 0.30–0.70)
Heparin Unfractionated: 0.81 IU/mL — ABNORMAL HIGH (ref 0.30–0.70)

## 2019-05-25 LAB — GLUCOSE, CAPILLARY
Glucose-Capillary: 115 mg/dL — ABNORMAL HIGH (ref 70–99)
Glucose-Capillary: 116 mg/dL — ABNORMAL HIGH (ref 70–99)
Glucose-Capillary: 96 mg/dL (ref 70–99)
Glucose-Capillary: 123 mg/dL — ABNORMAL HIGH (ref 70–99)

## 2019-05-25 LAB — MAGNESIUM: Magnesium: 2.1 mg/dL (ref 1.7–2.4)

## 2019-05-25 LAB — MRSA PCR SCREENING: MRSA by PCR: NEGATIVE

## 2019-05-25 MED ORDER — POTASSIUM CHLORIDE CRYS ER 20 MEQ PO TBCR
40.0000 meq | EXTENDED_RELEASE_TABLET | Freq: Two times a day (BID) | ORAL | Status: AC
  Administered 2019-05-25 (×3): 40 meq via ORAL
  Filled 2019-05-25 (×4): qty 2

## 2019-05-25 MED ORDER — INSULIN ASPART 100 UNIT/ML ~~LOC~~ SOLN
0.0000 [IU] | Freq: Three times a day (TID) | SUBCUTANEOUS | Status: DC
  Administered 2019-05-26 (×2): 1 [IU] via SUBCUTANEOUS
  Administered 2019-05-28: 2 [IU] via SUBCUTANEOUS
  Filled 2019-05-25 (×3): qty 1

## 2019-05-25 MED ORDER — POTASSIUM CHLORIDE CRYS ER 20 MEQ PO TBCR: 40.0000 meq | EXTENDED_RELEASE_TABLET | Freq: Once | ORAL | Status: DC

## 2019-05-25 MED ORDER — PRAVASTATIN SODIUM 20 MG PO TABS
20.0000 mg | ORAL_TABLET | Freq: Every day | ORAL | Status: DC
  Administered 2019-05-25 – 2019-05-28 (×2): 20 mg via ORAL
  Filled 2019-05-25 (×2): qty 1

## 2019-05-25 MED ORDER — CITALOPRAM HYDROBROMIDE 20 MG PO TABS
10.0000 mg | ORAL_TABLET | Freq: Every day | ORAL | Status: DC
  Administered 2019-05-25 – 2019-05-29 (×4): 10 mg via ORAL
  Filled 2019-05-25 (×4): qty 1

## 2019-05-25 MED ORDER — SODIUM CHLORIDE 0.9 % IV SOLN: INTRAVENOUS | Status: DC

## 2019-05-25 MED ORDER — ORAL CARE MOUTH RINSE
15.0000 mL | Freq: Two times a day (BID) | OROMUCOSAL | Status: DC
  Administered 2019-05-25 – 2019-05-29 (×8): 15 mL via OROMUCOSAL

## 2019-05-25 MED ORDER — HEPARIN BOLUS VIA INFUSION
3000.0000 [IU] | Freq: Once | INTRAVENOUS | Status: AC
  Administered 2019-05-25: 3000 [IU] via INTRAVENOUS
  Filled 2019-05-25: qty 3000

## 2019-05-25 NOTE — Progress Notes (Addendum)
PROGRESS NOTE    Jasmine Hubbard  WUJ:[id number] DOB: 1932-02-07 DOA: 05/24/2019  PCP: [name], MD    LOS - 1   Brief Narrative:  86 year old female with history of dementia, pulmonary embolism recently taken off Eliquis due to recurrent falls, diabetes, diastolic CHF, who presented to the ED from her nursing home after a syncopal episode that occurred while she was on the commode having diarrhea.  Per report, patient did not hit her head or lose consciousness.  In the ED, patient hypotensive and hypoxic with O2 sat of 86% on room air which resolved on 2 L/min nasal cannula oxygen.  Labs were notable for glucose 175, creatinine 1.32, potassium 2.8, hemoglobin 11.3.  CTA chest showed diffuse bilateral pulmonary emboli with CTevidence of right heart strain (RV/LV Ratio = 1.6) consistent with at least submassive (intermediate risk) PE.  Heparin drip was started and patient was admitted to stepdown unit on hospitalist service for further evaluation management.  Vascular surgery was consulted for consideration of possible thrombolysis or thrombectomy.   Subjective 2/27: Patient seen at bedside this morning.  No acute events reported overnight.  Patient was sleeping but awoke to voice.  She denies any acute complaints including chest pain or shortness of breath, fevers or chills, nausea vomiting or diarrhea.  Says she feels fine.    Assessment & Plan:   Active Problems:   Dementia (HCC)   Diabetes (HCC)   Chronic diastolic CHF (congestive heart failure) (HCC)   Pulmonary embolism (HCC)   Hypoxia   Hypokalemia   CKD (chronic kidney disease)  Acute pulmonary emboli -CTPA findings as outlined in above narrative.  Patient had recently been taken off Eliquis for prior PE due to recurrent falls and concern for bleeding risk.  Patient was hypotensive and hypoxic upon admission. --Continue heparin drip --Vascular surgery is consulted --Monitor vitals closely --Maintain MAP greater  than 65 with IV fluids as needed  Acute hypoxic respiratory failure -secondary to acute PE --Continue supplemental oxygen to maintain O2 sat greater than 90%, wean off as tolerated  Hypokalemia -present on admission, K2.8.  Replaced.  Magnesium was normal at 2.1. --Hold Lasix for now --Monitor BMP, replace K as needed for goal K>4  HFpEF -euvolemic upon admission.  Lasix on hold due to hypokalemia.  Will resume Lasix tomorrow if potassium is stable.  Type 2 diabetes -on sliding scale coverage at SNF --Sliding scale NovoLog and CBGs AC/at bedtime  Chronic kidney disease stage IIIa -baseline creatinine appears to be around 1.1-1.2.  On admission creatinine 1.32.  Monitor renal function closely.  Avoid nephrotoxic agents and renally dose meds as indicated.  Hyperlipidemia - continue pravastatin  Depression/Anxiety - continue Celexa  Dementia - does not appear to have behavioral disturbances.  Monitor.   DVT prophylaxis: heparin drip   Code Status: Full Code  Family Communication: none at bedside   Disposition Plan:  Expect d/c back to SNF pending further improvement in hemodynamic stability and evaluation by vascular surgery. Coming From SNF Exp DC Date 1/29  Barriers clinical as above Medically Stable for Discharge? No   Consultants:   Vascular surgery  Procedures:   None  Antimicrobials:   None    Objective: Vitals:   05/25/19 0500 05/25/19 0530 05/25/19 0700 05/25/19 0800  BP: 100/66 (!) 87/57 92/68 95/62   Pulse:  65 71 68  Resp: 19 (!) 21 20 17   Temp:    97.9 F (36.6 C)  TempSrc:    Oral  SpO2:  98% 92% 94%  Weight:      Height:        Intake/Output Summary (Last 24 hours) at 05/25/2019 0830 Last data filed at 05/25/2019 0345 Gross per 24 hour  Intake 572.91 ml  Output --  Net 572.91 ml   Filed Weights   05/24/19 1917 05/25/19 0000  Weight: 55.1 kg 55.5 kg    Examination:  General exam: awake with eyes closed, alert, no acute distress, frail,  pleasant HEENT: atraumatic, moist mucus membranes, hearing grossly normal  Respiratory system: CTAB, no wheezes, rales or rhonchi, normal respiratory effort. Cardiovascular system: normal S1/S2, RRR, no pedal edema.   Gastrointestinal system: soft, NT, ND, no HSM felt, +bowel sounds. Central nervous system: no gross focal neurologic deficits, normal speech Extremities: moves all, no cyanosis, normal tone Skin: dry, intact, normal temperature Psychiatry: normal mood, congruent affect, no signs of agitation   Data Reviewed: I have personally reviewed following labs and imaging studies  CBC: Recent Labs  Lab 05/24/19 1922 05/25/19 0632  WBC 10.2 7.7  HGB 11.3* 9.1*  HCT 34.2* 27.4*  MCV 87.7 87.5  PLT 182 222   Basic Metabolic Panel: Recent Labs  Lab 05/24/19 1922 05/24/19 2210 05/25/19 0632  NA 140  --  142  K 2.8*  --  3.5  CL 100  --  107  CO2 29  --  29  GLUCOSE 175*  --  111*  BUN 17  --  15  CREATININE 1.32*  --  1.16*  CALCIUM 8.5*  --  7.9*  MG  --  2.1  --    GFR: Estimated Creatinine Clearance: 28 mL/min (A) (by C-G formula based on SCr of 1.16 mg/dL (H)). Liver Function Tests: No results for input(s): AST, ALT, ALKPHOS, BILITOT, PROT, ALBUMIN in the last 168 hours. No results for input(s): LIPASE, AMYLASE in the last 168 hours. No results for input(s): AMMONIA in the last 168 hours. Coagulation Profile: Recent Labs  Lab 05/24/19 2210  INR 1.1   Cardiac Enzymes: No results for input(s): CKTOTAL, CKMB, CKMBINDEX, TROPONINI in the last 168 hours. BNP (last 3 results) No results for input(s): PROBNP in the last 8760 hours. HbA1C: No results for input(s): HGBA1C in the last 72 hours. CBG: Recent Labs  Lab 05/24/19 1929 05/25/19 0752  GLUCAP 147* 115*   Lipid Profile: No results for input(s): CHOL, HDL, LDLCALC, TRIG, CHOLHDL, LDLDIRECT in the last 72 hours. Thyroid Function Tests: No results for input(s): TSH, T4TOTAL, FREET4, T3FREE, THYROIDAB  in the last 72 hours. Anemia Panel: No results for input(s): VITAMINB12, FOLATE, FERRITIN, TIBC, IRON, RETICCTPCT in the last 72 hours. Sepsis Labs: No results for input(s): PROCALCITON, LATICACIDVEN in the last 168 hours.  Recent Results (from the past 240 hour(s))  Respiratory Panel by RT PCR (Flu A&B, Covid) - Nasopharyngeal Swab     Status: None   Collection Time: 05/24/19 10:36 PM   Specimen: Nasopharyngeal Swab  Result Value Ref Range Status   SARS Coronavirus 2 by RT PCR NEGATIVE NEGATIVE Final    Comment: (NOTE) SARS-CoV-2 target nucleic acids are NOT DETECTED. The SARS-CoV-2 RNA is generally detectable in upper respiratoy specimens during the acute phase of infection. The lowest concentration of SARS-CoV-2 viral copies this assay can detect is 131 copies/mL. A negative result does not preclude SARS-Cov-2 infection and should not be used as the sole basis for treatment or other patient management decisions. A negative result may occur with  improper specimen collection/handling, submission of specimen  other than nasopharyngeal swab, presence of viral mutation(s) within the areas targeted by this assay, and inadequate number of viral copies (<131 copies/mL). A negative result must be combined with clinical observations, patient history, and epidemiological information. The expected result is Negative. Fact Sheet for Patients:  https://www.moore.com/ Fact Sheet for Healthcare Providers:  https://www.young.biz/ This test is not yet ap proved or cleared by the [place] FDA and  has been authorized for detection and/or diagnosis of SARS-CoV-2 by FDA under an Emergency Use Authorization (EUA). This EUA will remain  in effect (meaning this test can be used) for the duration of the COVID-19 declaration under Section 564(b)(1) of the Act, 21 U.S.C. section 360bbb-3(b)(1), unless the authorization is terminated or revoked sooner.     Influenza A by PCR NEGATIVE NEGATIVE Final   Influenza B by PCR NEGATIVE NEGATIVE Final    Comment: (NOTE) The Xpert Xpress SARS-CoV-2/FLU/RSV assay is intended as an aid in  the diagnosis of influenza from Nasopharyngeal swab specimens and  should not be used as a sole basis for treatment. Nasal washings and  aspirates are unacceptable for Xpert Xpress SARS-CoV-2/FLU/RSV  testing. Fact Sheet for Patients: https://www.moore.com/ Fact Sheet for Healthcare Providers: https://www.young.biz/ This test is not yet approved or cleared by the [place] FDA and  has been authorized for detection and/or diagnosis of SARS-CoV-2 by  FDA under an Emergency Use Authorization (EUA). This EUA will remain  in effect (meaning this test can be used) for the duration of the  Covid-19 declaration under Section 564(b)(1) of the Act, 21  U.S.C. section 360bbb-3(b)(1), unless the authorization is  terminated or revoked. Performed at [facility], [address] [place], [place] [address]   MRSA PCR Screening     Status: None   Collection Time: 05/25/19 12:13 AM   Specimen: Nasopharyngeal  Result Value Ref Range Status   MRSA by PCR NEGATIVE NEGATIVE Final    Comment:        The GeneXpert MRSA Assay (FDA approved for NASAL specimens only), is one component of a comprehensive MRSA colonization surveillance program. It is not intended to diagnose MRSA infection nor to guide or monitor treatment for MRSA infections. Performed at [facility], [address] [place], [place] [address]          Radiology Studies: DG Chest 1 View  Result Date: 05/24/2019 CLINICAL DATA:  Syncope EXAM: CHEST  1 VIEW COMPARISON:  CT same day, April 12, 2019 FINDINGS: The heart size and mediastinal contours are within normal limits. Aortic knob calcifications. Both lungs are clear. The visualized skeletal structures are unremarkable. IMPRESSION: No active  disease. Electronically Signed   By: Jonna Clark M.D.   On: 05/24/2019 22:22   CT Angio Chest PE W/Cm &/Or Wo Cm  Result Date: 05/24/2019 CLINICAL DATA:  Hypoxia, syncope EXAM: CT ANGIOGRAPHY CHEST WITH CONTRAST TECHNIQUE: Multidetector CT imaging of the chest was performed using the standard protocol during bolus administration of intravenous contrast. Multiplanar CT image reconstructions and MIPs were obtained to evaluate the vascular anatomy. CONTRAST:  19mL OMNIPAQUE IOHEXOL 350 MG/ML SOLN COMPARISON:  04/12/2019 FINDINGS: Cardiovascular: This is a technically adequate evaluation of the pulmonary vasculature. Diffuse bilateral pulmonary emboli are seen within all lobar distributions, as well as a large embolus persisting in the right main pulmonary artery. Significant clot burden. Dilated right ventricle with RV/LV ratio measuring 1.6. The heart is not enlarged. Significant atherosclerosis of the thoracic aorta. Aberrant origin of the right subclavian artery is  noted, a frequent anatomic variant. No evidence of thoracic aortic aneurysm or dissection. Mediastinum/Nodes: No enlarged mediastinal, hilar, or axillary lymph nodes. Thyroid gland, trachea, and esophagus demonstrate no significant findings. Lungs/Pleura: Mild upper lobe predominant emphysema. No airspace disease, effusion, or pneumothorax. The central airways are patent. Upper Abdomen: No acute abnormality. Musculoskeletal: No acute or destructive bony lesions. Reconstructed images demonstrate no additional findings. Review of the MIP images confirms the above findings. IMPRESSION: 1. Diffuse bilateral pulmonary emboli with CTevidence of right heart strain (RV/LV Ratio = 1.6) consistent with at least submassive (intermediate risk) PE. The presence of right heart strain has been associated with an increased risk of morbidity and mortality. 2.  Aortic Atherosclerosis (ICD10-I70.0). 3. Emphysema (ICD10-J43.9). These results were called by telephone at  the time of interpretation on 05/24/2019 at 9:56 pm to provider [facility] , who verbally acknowledged these results. Electronically Signed   By: Sharlet Salina M.D.   On: 05/24/2019 21:56        Scheduled Meds: . Chlorhexidine Gluconate Cloth  6 each Topical Daily  . citalopram  10 mg Oral Daily  . insulin aspart  0-9 Units Subcutaneous TID WC  . mouth rinse  15 mL Mouth Rinse BID  . potassium chloride  40 mEq Oral BID  . pravastatin  20 mg Oral q1800   Continuous Infusions: . heparin 800 Units/hr (05/25/19 0759)     LOS: 1 day    Time spent: 40-45 minutes    Pennie Banter, DO [facility]   If 7PM-7AM, please contact night-coverage [email or web address] 05/25/2019, 8:30 AM

## 2019-05-25 NOTE — Consult Note (Signed)
Reason for Consult:Pulmonary Embolism Referring Physician: Dr. Alben Deeds Jasmine Hubbard is an 86 y.o. female.  HPI: Patient with history of dementia, PEs previously on Eliquis, discontinued secondary to frequent falls presents after syncopal episode. Upon arrival to ER found to be hypoxic, CT Chest revealed Pulmonary emboli.   Past Medical History:  Diagnosis Date  . CHF (congestive heart failure) (HCC)   . Diabetes mellitus without complication (HCC)   . Left ventricular failure (HCC)   . Osteoporosis   . Pulmonary embolism (HCC)   . Pulmonary embolus (HCC)   . Vascular dementia [facility])     Past Surgical History:  Procedure Laterality Date  . ABDOMINAL HYSTERECTOMY    . BACK SURGERY    . FRACTURE SURGERY     elbow    History reviewed. No pertinent family history.  Social History:  reports that she quit smoking about 19 years ago. Her smoking use included cigarettes. She has never used smokeless tobacco. She reports previous alcohol use. She reports that she does not use drugs.  Allergies:  Allergies  Allergen Reactions  . Naproxen Hives and Shortness Of Breath    Medications: I have reviewed the patient's current medications.  Results for orders placed or performed during the hospital encounter of 05/24/19 (from the past 48 hour(s))  Basic metabolic panel     Status: Abnormal   Collection Time: 05/24/19  7:22 PM  Result Value Ref Range   Sodium 140 135 - 145 mmol/L   Potassium 2.8 (L) 3.5 - 5.1 mmol/L   Chloride 100 98 - 111 mmol/L   CO2 29 22 - 32 mmol/L   Glucose, Bld 175 (H) 70 - 99 mg/dL    Comment: Glucose reference range applies only to samples taken after fasting for at least 8 hours.   BUN 17 8 - 23 mg/dL   Creatinine, Ser 7.85 (H) 0.44 - 1.00 mg/dL   Calcium 8.5 (L) 8.9 - 10.3 mg/dL   GFR calc non Af Amer 37 (L) >60 mL/min   GFR calc Af Amer 43 (L) >60 mL/min   Anion gap 11 5 - 15    Comment: Performed at [facility], [address] [place], [place] [address]  CBC     Status: Abnormal   Collection Time: 05/24/19  7:22 PM  Result Value Ref Range   WBC 10.2 4.0 - 10.5 K/uL   RBC 3.90 3.87 - 5.11 MIL/uL   Hemoglobin 11.3 (L) 12.0 - 15.0 g/dL   HCT 77.4 (L) 12.8 - 78.6 %   MCV 87.7 80.0 - 100.0 fL   MCH 29.0 26.0 - 34.0 pg   MCHC 33.0 30.0 - 36.0 g/dL   RDW 76.7 20.9 - 47.0 %   Platelets 182 150 - 400 K/uL   nRBC 0.0 0.0 - 0.2 %    Comment: Performed at [facility], [address] [place], [place] [address]  Glucose, capillary     Status: Abnormal   Collection Time: 05/24/19  7:29 PM  Result Value Ref Range   Glucose-Capillary 147 (H) 70 - 99 mg/dL    Comment: Glucose reference range applies only to samples taken after fasting for at least 8 hours.  Troponin I (High Sensitivity)     Status: Abnormal   Collection Time: 05/24/19 10:10 PM  Result Value Ref Range   Troponin I (High Sensitivity) 81 (H) <18 ng/L    Comment: (NOTE) Elevated high sensitivity troponin I (hsTnI) values and significant  changes across serial measurements may suggest ACS but many other  chronic and acute conditions are known to elevate hsTnI results.  Refer to the "Links" section for chest pain algorithms and additional  guidance. Performed at [facility], [address] [place], [place] [address]   Brain natriuretic peptide     Status: Abnormal   Collection Time: 05/24/19 10:10 PM  Result Value Ref Range   B Natriuretic Peptide 131.0 (H) 0.0 - 100.0 pg/mL    Comment: Performed at [facility], [address] [place], [place] [address]  APTT     Status: None   Collection Time: 05/24/19 10:10 PM  Result Value Ref Range   aPTT 32 24 - 36 seconds    Comment: Performed at [facility], [address] [place], [place] [address]  Protime-INR     Status: None   Collection Time: 05/24/19 10:10 PM  Result Value Ref Range   Prothrombin Time 14.5 11.4 - 15.2 seconds   INR 1.1 0.8 - 1.2     Comment: (NOTE) INR goal varies based on device and disease states. Performed at [facility], [address] [place], [place] [address]   Heparin level (unfractionated)     Status: Abnormal   Collection Time: 05/24/19 10:10 PM  Result Value Ref Range   Heparin Unfractionated <0.10 (L) 0.30 - 0.70 IU/mL    Comment: (NOTE) If heparin results are below expected values, and patient dosage has  been confirmed, suggest follow up testing of antithrombin III levels. Performed at [facility], [address] [place], [place] [address]   Magnesium     Status: None   Collection Time: 05/24/19 10:10 PM  Result Value Ref Range   Magnesium 2.1 1.7 - 2.4 mg/dL    Comment: Performed at [facility], [address] [place], [place] [address]  Respiratory Panel by RT PCR (Flu A&B, Covid) - Nasopharyngeal Swab     Status: None   Collection Time: 05/24/19 10:36 PM   Specimen: Nasopharyngeal Swab  Result Value Ref Range   SARS Coronavirus 2 by RT PCR NEGATIVE NEGATIVE    Comment: (NOTE) SARS-CoV-2 target nucleic acids are NOT DETECTED. The SARS-CoV-2 RNA is generally detectable in upper respiratoy specimens during the acute phase of infection. The lowest concentration of SARS-CoV-2 viral copies this assay can detect is 131 copies/mL. A negative result does not preclude SARS-Cov-2 infection and should not be used as the sole basis for treatment or other patient management decisions. A negative result may occur with  improper specimen collection/handling, submission of specimen other than nasopharyngeal swab, presence of viral mutation(s) within the areas targeted by this assay, and inadequate number of viral copies (<131 copies/mL). A negative result must be combined with clinical observations, patient history, and epidemiological information. The expected result is Negative. Fact Sheet for Patients:  https://www.moore.com/ Fact Sheet for  Healthcare Providers:  https://www.young.biz/ This test is not yet ap proved or cleared by the [place] FDA and  has been authorized for detection and/or diagnosis of SARS-CoV-2 by FDA under an Emergency Use Authorization (EUA). This EUA will remain  in effect (meaning this test can be used) for the duration of the COVID-19 declaration under Section 564(b)(1) of the Act, 21 U.S.C. section 360bbb-3(b)(1), unless the authorization is terminated or revoked sooner.    Influenza A by PCR NEGATIVE NEGATIVE   Influenza B by PCR NEGATIVE NEGATIVE    Comment: (NOTE) The Xpert Xpress SARS-CoV-2/FLU/RSV assay is  intended as an aid in  the diagnosis of influenza from Nasopharyngeal swab specimens and  should not be used as a sole basis for treatment. Nasal washings and  aspirates are unacceptable for Xpert Xpress SARS-CoV-2/FLU/RSV  testing. Fact Sheet for Patients: https://www.moore.com/ Fact Sheet for Healthcare Providers: https://www.young.biz/ This test is not yet approved or cleared by the [place] FDA and  has been authorized for detection and/or diagnosis of SARS-CoV-2 by  FDA under an Emergency Use Authorization (EUA). This EUA will remain  in effect (meaning this test can be used) for the duration of the  Covid-19 declaration under Section 564(b)(1) of the Act, 21  U.S.C. section 360bbb-3(b)(1), unless the authorization is  terminated or revoked. Performed at [facility], [address] [place], [place] [address]   MRSA PCR Screening     Status: None   Collection Time: 05/25/19 12:13 AM   Specimen: Nasopharyngeal  Result Value Ref Range   MRSA by PCR NEGATIVE NEGATIVE    Comment:        The GeneXpert MRSA Assay (FDA approved for NASAL specimens only), is one component of a comprehensive MRSA colonization surveillance program. It is not intended to diagnose MRSA infection nor to guide or monitor  treatment for MRSA infections. Performed at [facility], [address] [place], [place] [address]   CBC     Status: Abnormal   Collection Time: 05/25/19  6:32 AM  Result Value Ref Range   WBC 7.7 4.0 - 10.5 K/uL   RBC 3.13 (L) 3.87 - 5.11 MIL/uL   Hemoglobin 9.1 (L) 12.0 - 15.0 g/dL   HCT 85.6 (L) 31.4 - 97.0 %   MCV 87.5 80.0 - 100.0 fL   MCH 29.1 26.0 - 34.0 pg   MCHC 33.2 30.0 - 36.0 g/dL   RDW 26.3 78.5 - 88.5 %   Platelets 156 150 - 400 K/uL   nRBC 0.0 0.0 - 0.2 %    Comment: Performed at [facility], [address], [place], [place] [address]  Basic metabolic panel     Status: Abnormal   Collection Time: 05/25/19  6:32 AM  Result Value Ref Range   Sodium 142 135 - 145 mmol/L   Potassium 3.5 3.5 - 5.1 mmol/L   Chloride 107 98 - 111 mmol/L   CO2 29 22 - 32 mmol/L   Glucose, Bld 111 (H) 70 - 99 mg/dL    Comment: Glucose reference range applies only to samples taken after fasting for at least 8 hours.   BUN 15 8 - 23 mg/dL   Creatinine, Ser 1.28 (H) 0.44 - 1.00 mg/dL   Calcium 7.9 (L) 8.9 - 10.3 mg/dL   GFR calc non Af Amer 43 (L) >60 mL/min   GFR calc Af Amer 50 (L) >60 mL/min   Anion gap 6 5 - 15    Comment: Performed at [facility], [address] [place], [place] [address]  Heparin level (unfractionated)     Status: Abnormal   Collection Time: 05/25/19  6:32 AM  Result Value Ref Range   Heparin Unfractionated 0.96 (H) 0.30 - 0.70 IU/mL    Comment: (NOTE) If heparin results are below expected values, and patient dosage has  been confirmed, suggest follow up testing of antithrombin III levels. Performed at [facility], [address] [place], [place] [address]   Glucose, capillary     Status: Abnormal   Collection Time: 05/25/19  7:52 AM  Result Value  Ref Range   Glucose-Capillary 115 (H) 70 - 99 mg/dL    Comment: Glucose reference range applies only to samples taken after fasting for at least 8 hours.  Glucose,  capillary     Status: Abnormal   Collection Time: 05/25/19 11:34 AM  Result Value Ref Range   Glucose-Capillary 116 (H) 70 - 99 mg/dL    Comment: Glucose reference range applies only to samples taken after fasting for at least 8 hours.  Urinalysis, Complete w Microscopic     Status: Abnormal   Collection Time: 05/25/19 12:00 PM  Result Value Ref Range   Color, Urine YELLOW (A) YELLOW   APPearance HAZY (A) CLEAR   Specific Gravity, Urine >1.046 (H) 1.005 - 1.030   pH 5.0 5.0 - 8.0   Glucose, UA NEGATIVE NEGATIVE mg/dL   Hgb urine dipstick NEGATIVE NEGATIVE   Bilirubin Urine NEGATIVE NEGATIVE   Ketones, ur NEGATIVE NEGATIVE mg/dL   Protein, ur NEGATIVE NEGATIVE mg/dL   Nitrite NEGATIVE NEGATIVE   Leukocytes,Ua SMALL (A) NEGATIVE   RBC / HPF 0-5 0 - 5 RBC/hpf   WBC, UA 6-10 0 - 5 WBC/hpf   Bacteria, UA FEW (A) NONE SEEN   Squamous Epithelial / LPF 0-5 0 - 5    Comment: Performed at [facility], [address], [place], [place] [address]    DG Chest 1 View  Result Date: 05/24/2019 CLINICAL DATA:  Syncope EXAM: CHEST  1 VIEW COMPARISON:  CT same day, April 12, 2019 FINDINGS: The heart size and mediastinal contours are within normal limits. Aortic knob calcifications. Both lungs are clear. The visualized skeletal structures are unremarkable. IMPRESSION: No active disease. Electronically Signed   By: Prudencio Pair M.D.   On: 05/24/2019 22:22   CT Angio Chest PE W/Cm &/Or Wo Cm  Result Date: 05/24/2019 CLINICAL DATA:  Hypoxia, syncope EXAM: CT ANGIOGRAPHY CHEST WITH CONTRAST TECHNIQUE: Multidetector CT imaging of the chest was performed using the standard protocol during bolus administration of intravenous contrast. Multiplanar CT image reconstructions and MIPs were obtained to evaluate the vascular anatomy. CONTRAST:  38mL OMNIPAQUE IOHEXOL 350 MG/ML SOLN COMPARISON:  04/12/2019 FINDINGS: Cardiovascular: This is a technically adequate evaluation of the pulmonary vasculature.  Diffuse bilateral pulmonary emboli are seen within all lobar distributions, as well as a large embolus persisting in the right main pulmonary artery. Significant clot burden. Dilated right ventricle with RV/LV ratio measuring 1.6. The heart is not enlarged. Significant atherosclerosis of the thoracic aorta. Aberrant origin of the right subclavian artery is noted, a frequent anatomic variant. No evidence of thoracic aortic aneurysm or dissection. Mediastinum/Nodes: No enlarged mediastinal, hilar, or axillary lymph nodes. Thyroid gland, trachea, and esophagus demonstrate no significant findings. Lungs/Pleura: Mild upper lobe predominant emphysema. No airspace disease, effusion, or pneumothorax. The central airways are patent. Upper Abdomen: No acute abnormality. Musculoskeletal: No acute or destructive bony lesions. Reconstructed images demonstrate no additional findings. Review of the MIP images confirms the above findings. IMPRESSION: 1. Diffuse bilateral pulmonary emboli with CTevidence of right heart strain (RV/LV Ratio = 1.6) consistent with at least submassive (intermediate risk) PE. The presence of right heart strain has been associated with an increased risk of morbidity and mortality. 2.  Aortic Atherosclerosis (ICD10-I70.0). 3. Emphysema (ICD10-J43.9). These results were called by telephone at the time of interpretation on 05/24/2019 at 9:56 pm to provider [facility] , who verbally acknowledged these results. Electronically Signed   By: Randa Ngo M.D.   On: 05/24/2019 21:56  Review of Systems  Unable to perform ROS: Dementia   Blood pressure 101/66, pulse 64, temperature (!) 97.3 F (36.3 C), temperature source Oral, resp. rate 19, height 5\' 2"  (1.575 m), weight 55.5 kg, SpO2 97 %. Physical Exam  Nursing note and vitals reviewed. Constitutional: She appears well-developed.  HENT:  Head: Normocephalic.  Cardiovascular: Normal rate and regular rhythm.  Respiratory: Effort normal and breath  sounds normal. No respiratory distress. She has no wheezes.  GI: Soft. There is no abdominal tenderness.  Musculoskeletal:        General: No edema. Normal range of motion.     Cervical back: Normal range of motion and neck supple.  Neurological:  Opens eyes  Skin: Skin is warm.    Assessment/Plan: Bilateral scattered Pulmonary emboli.  Patient hemodynamically stable, Oxygen saturations are stable on 2 L [place].  The pulmonary emboli are bilateral multiple and diffuse. Would not recommend Thrombolysis/Thrombectomy because of such.  Continue Heparin gtt. Continue supportive Care.  Would consider IVC Filter if patient will not tolerate long term anticoagulation outpatient.   Zayde Stroupe A 05/25/2019, 3:13 PM

## 2019-05-25 NOTE — Progress Notes (Addendum)
ANTICOAGULATION CONSULT NOTE  Pharmacy Consult for heparin Indication: pulmonary embolus  Allergies  Allergen Reactions  . Naproxen Hives and Shortness Of Breath    Patient Measurements: Height: 5\' 2"  (157.5 cm) Weight: 122 lb 5.7 oz (55.5 kg) IBW/kg (Calculated) : 50.1 Heparin Dosing Weight: 55 kg  Vital Signs: Temp: 97.3 F (36.3 C) (02/27 1400) Temp Source: Oral (02/27 1400) BP: 103/61 (02/27 1800) Pulse Rate: 72 (02/27 1800)  Labs: Recent Labs    05/24/19 1922 05/24/19 2210 05/25/19 0632 05/25/19 1718  HGB 11.3*  --  9.1*  --   HCT 34.2*  --  27.4*  --   PLT 182  --  156  --   APTT  --  32  --   --   LABPROT  --  14.5  --   --   INR  --  1.1  --   --   HEPARINUNFRC  --  <0.10* 0.96* 0.81*  CREATININE 1.32*  --  1.16*  --   TROPONINIHS  --  81*  --   --     Estimated Creatinine Clearance: 28 mL/min (A) (by C-G formula based on SCr of 1.16 mg/dL (H)).   Medical History: Past Medical History:  Diagnosis Date  . CHF (congestive heart failure) (HCC)   . Diabetes mellitus without complication (HCC)   . Left ventricular failure (HCC)   . Osteoporosis   . Pulmonary embolism (HCC)   . Pulmonary embolus (HCC)   . Vascular dementia (HCC)     Medications:  Scheduled:  . Chlorhexidine Gluconate Cloth  6 each Topical Daily  . citalopram  10 mg Oral Daily  . insulin aspart  0-9 Units Subcutaneous TID WC  . mouth rinse  15 mL Mouth Rinse BID  . potassium chloride  40 mEq Oral BID  . pravastatin  20 mg Oral q1800    Assessment: Patient arrives s/t fall from toilet at [facility] house w/ syncopal episode. Patient w/ h/o DM, CHF, PE admitted w/ PE on CT w/ R heart strain. Patient is on eliquis 2.5 mg bid PTA, last dose not recorded. Patient is being started on a heparin drip for management of pulmonary embolism, baseline CBC WNL.  2/27 1743 HL 0.81 supratherapeutic and confirmed with nurse no bleeding concerns.   Goal of Therapy:  Heparin level 0.3-0.7  units/ml Monitor platelets by anticoagulation protocol: Yes   Plan:  Decrease heparin drip to 650 units/hr- ICU nurse informed of dose change. Repeat HL in 6 hours. CBC daily while on heparin drip.  3/27, PharmD Clinical Pharmacist 05/25/2019,6:10 PM

## 2019-05-25 NOTE — Progress Notes (Signed)
ANTICOAGULATION CONSULT NOTE  Pharmacy Consult for heparin Indication: pulmonary embolus  Allergies  Allergen Reactions  . Naproxen Hives and Shortness Of Breath    Patient Measurements: Height: 5\' 2"  (157.5 cm) Weight: 122 lb 5.7 oz (55.5 kg) IBW/kg (Calculated) : 50.1 Heparin Dosing Weight: 55 kg  Vital Signs: Temp: 97.9 F (36.6 C) (02/27 0800) Temp Source: Oral (02/27 0800) BP: 95/62 (02/27 0800) Pulse Rate: 68 (02/27 0800)  Labs: Recent Labs    05/24/19 1922 05/24/19 2210 05/25/19 0632  HGB 11.3*  --  9.1*  HCT 34.2*  --  27.4*  PLT 182  --  156  APTT  --  32  --   LABPROT  --  14.5  --   INR  --  1.1  --   HEPARINUNFRC  --  <0.10* 0.96*  CREATININE 1.32*  --  1.16*  TROPONINIHS  --  81*  --     Estimated Creatinine Clearance: 28 mL/min (A) (by C-G formula based on SCr of 1.16 mg/dL (H)).   Medical History: Past Medical History:  Diagnosis Date  . CHF (congestive heart failure) (HCC)   . Diabetes mellitus without complication (HCC)   . Left ventricular failure (HCC)   . Osteoporosis   . Pulmonary embolism (HCC)   . Pulmonary embolus (HCC)   . Vascular dementia (HCC)     Medications:  Scheduled:  . Chlorhexidine Gluconate Cloth  6 each Topical Daily  . citalopram  10 mg Oral Daily  . insulin aspart  0-9 Units Subcutaneous TID WC  . mouth rinse  15 mL Mouth Rinse BID  . potassium chloride  40 mEq Oral BID  . pravastatin  20 mg Oral q1800    Assessment: Patient arrives s/t fall from toilet at [facility] house w/ syncopal episode. Patient w/ h/o DM, CHF, PE admitted w/ PE on CT w/ R heart strain. Patient is on eliquis 2.5 mg bid PTA, last dose not recorded. Patient is being started on a heparin drip for management of pulmonary embolism, baseline CBC WNL.  Goal of Therapy:  Heparin level 0.3-0.7 units/ml Monitor platelets by anticoagulation protocol: Yes   Plan:  2/27 0632 HL 0.96 supratherapeutic Decrease heparin drip to 800 units/hr. Repeat HL  at 1600. CBC daily while on heparin drip.  3/27, PharmD Clinical Pharmacist 05/25/2019,8:23 AM

## 2019-05-26 ENCOUNTER — Encounter: Payer: Self-pay | Admitting: Internal Medicine

## 2019-05-26 LAB — MAGNESIUM: Magnesium: 2.1 mg/dL (ref 1.7–2.4)

## 2019-05-26 LAB — CBC
HCT: 27.5 % — ABNORMAL LOW (ref 36.0–46.0)
Hemoglobin: 8.8 g/dL — ABNORMAL LOW (ref 12.0–15.0)
MCH: 28.7 pg (ref 26.0–34.0)
MCHC: 32 g/dL (ref 30.0–36.0)
MCV: 89.6 fL (ref 80.0–100.0)
Platelets: 144 10*3/uL — ABNORMAL LOW (ref 150–400)
RBC: 3.07 MIL/uL — ABNORMAL LOW (ref 3.87–5.11)
RDW: 14 % (ref 11.5–15.5)
WBC: 6.7 10*3/uL (ref 4.0–10.5)
nRBC: 0 % (ref 0.0–0.2)

## 2019-05-26 LAB — GLUCOSE, CAPILLARY
Glucose-Capillary: 125 mg/dL — ABNORMAL HIGH (ref 70–99)
Glucose-Capillary: 149 mg/dL — ABNORMAL HIGH (ref 70–99)
Glucose-Capillary: 122 mg/dL — ABNORMAL HIGH (ref 70–99)
Glucose-Capillary: 100 mg/dL — ABNORMAL HIGH (ref 70–99)
Glucose-Capillary: 140 mg/dL — ABNORMAL HIGH (ref 70–99)

## 2019-05-26 LAB — BASIC METABOLIC PANEL
Anion gap: 7 (ref 5–15)
BUN: 15 mg/dL (ref 8–23)
CO2: 24 mmol/L (ref 22–32)
Calcium: 8.2 mg/dL — ABNORMAL LOW (ref 8.9–10.3)
Chloride: 112 mmol/L — ABNORMAL HIGH (ref 98–111)
Creatinine, Ser: 1.13 mg/dL — ABNORMAL HIGH (ref 0.44–1.00)
GFR calc Af Amer: 51 mL/min — ABNORMAL LOW (ref >60)
GFR calc non Af Amer: 44 mL/min — ABNORMAL LOW (ref >60)
Glucose, Bld: 103 mg/dL — ABNORMAL HIGH (ref 70–99)
Potassium: 4.2 mmol/L (ref 3.5–5.1)
Sodium: 143 mmol/L (ref 135–145)

## 2019-05-26 LAB — HEPARIN LEVEL (UNFRACTIONATED)
Heparin Unfractionated: 0.51 IU/mL (ref 0.30–0.70)
Heparin Unfractionated: 0.48 IU/mL (ref 0.30–0.70)

## 2019-05-26 NOTE — Progress Notes (Addendum)
Pt was incontinent for mix of urine and liquid stool.  Bloody-tinge noted to mixture.  Heparin stopped. Secure text sent to Dr. Myriam Forehand.  [id number]--No response to secure text.  Dr. Myriam Forehand paged. [phone]--Spoke with Dr. Myriam Forehand regarding above situation.  Received order: Heparin IV d/c'd.

## 2019-05-26 NOTE — Progress Notes (Signed)
Shift summary:  - Diarrhea this AM x 2. - 1 L/min of [place] O2. - Remains on Heparin Gtt.

## 2019-05-26 NOTE — Progress Notes (Signed)
Patient's daughter, Toniann Fail, has been notified that IV Heparin has been stopped because of bloody stools. She understands that patient is at increased risk of new thromboembolic events because of acute PE. She also understands that acute GI bleeding is a contraindication to IV Heparin.  She is waiting to discuss IVC filter with vascular surgeon before making any final decision on long term anticoagulation.

## 2019-05-26 NOTE — Progress Notes (Signed)
ANTICOAGULATION CONSULT NOTE  Pharmacy Consult for heparin Indication: pulmonary embolus  Allergies  Allergen Reactions  . Naproxen Hives and Shortness Of Breath    Patient Measurements: Height: 5\' 2"  (157.5 cm) Weight: 122 lb 5.7 oz (55.5 kg) IBW/kg (Calculated) : 50.1 Heparin Dosing Weight: 55 kg  Vital Signs: Temp: 96.6 F (35.9 C) (02/28 0800) Temp Source: Axillary (02/28 0800) BP: 124/104 (02/28 0800) Pulse Rate: 76 (02/28 0800)  Labs: Recent Labs    05/24/19 1922 05/24/19 2210 05/24/19 2210 05/25/19 05/27/19 05/25/19 05/27/19 05/25/19 1718 05/26/19 0021 05/26/19 0456 05/26/19 0835  HGB 11.3*  --    < > 9.1*  --   --   --  8.8*  --   HCT 34.2*  --   --  27.4*  --   --   --  27.5*  --   PLT 182  --   --  156  --   --   --  144*  --   APTT  --  32  --   --   --   --   --   --   --   LABPROT  --  14.5  --   --   --   --   --   --   --   INR  --  1.1  --   --   --   --   --   --   --   HEPARINUNFRC  --  <0.10*   < > 0.96*   < > 0.81* 0.51  --  0.48  CREATININE 1.32*  --   --  1.16*  --   --   --  1.13*  --   TROPONINIHS  --  81*  --   --   --   --   --   --   --    < > = values in this interval not displayed.    Estimated Creatinine Clearance: 28.8 mL/min (A) (by C-G formula based on SCr of 1.13 mg/dL (H)).   Medical History: Past Medical History:  Diagnosis Date  . CHF (congestive heart failure) (HCC)   . Diabetes mellitus without complication (HCC)   . Left ventricular failure (HCC)   . Osteoporosis   . Pulmonary embolism (HCC)   . Pulmonary embolus (HCC)   . Vascular dementia (HCC)     Medications:  Scheduled:  . Chlorhexidine Gluconate Cloth  6 each Topical Daily  . citalopram  10 mg Oral Daily  . insulin aspart  0-9 Units Subcutaneous TID WC  . mouth rinse  15 mL Mouth Rinse BID  . pravastatin  20 mg Oral q1800    Assessment: Patient arrives s/t fall from toilet at [facility] house w/ syncopal episode. Patient w/ h/o DM, CHF, PE admitted w/ PE on CT  w/ R heart strain. Patient is on eliquis 2.5 mg bid PTA, last dose not recorded. Patient is being started on a heparin drip for management of pulmonary embolism, baseline CBC WNL.  2/27 1743 HL 0.81 supratherapeutic and confirmed with nurse no bleeding concerns.   Goal of Therapy:  Heparin level 0.3-0.7 units/ml Monitor platelets by anticoagulation protocol: Yes   Plan:  02/28 0835 HL 0.48 therapeutic x 2. Will continue heparin at 650 units/hr. HL/CBC with morning labs.  3/28, PharmD Clinical Pharmacist 05/26/2019,9:15 AM

## 2019-05-26 NOTE — Progress Notes (Signed)
Pt transferred from ICU to 2A cardiac/telemetry unit with yellow MEWS.  Pt assessed, and NAD noted.  Will continue to monitor and follow MEWS protocol.  Family and patient oriented to safety features of room.  Bed low and brakes locked.  Call light within reach.  Bed alarm and mittens on for safety.

## 2019-05-26 NOTE — Progress Notes (Addendum)
Progress Note    Jasmine Hubbard  YPP:[id number] DOB: 1932/02/22  DOA: 05/24/2019 PCP: [name], MD      Brief Narrative:    Medical records reviewed and are as summarized below:  Jasmine Hubbard is an 86 y.o. female with medical history of dementia, pulmonary embolism recently taken off Eliquis due to recurrent falls, diabetes, diastolic CHF, who presented to the ED from her nursing home after a syncopal episode that occurred while she was on the commode having diarrhea.  Per report, patient did not hit her head or lose consciousness.  In the ED, patient hypotensive and hypoxic with O2 sat of 86% on room air which resolved on 2 L/min nasal cannula oxygen.  Labs were notable for glucose 175, creatinine 1.32, potassium 2.8, hemoglobin 11.3.  CTA chest showed diffuse bilateral pulmonary emboli with CTevidence of right heart strain (RV/LV Ratio = 1.6) consistent with at least submassive (intermediate risk) PE.  Heparin drip was started and patient was admitted to stepdown unit on hospitalist service for further evaluation management.  Vascular surgery was consulted for consideration of possible thrombolysis or thrombectomy.       Assessment/Plan:   Active Problems:   Dementia (HCC)   Diabetes (HCC)   Chronic diastolic CHF (congestive heart failure) (HCC)   Pulmonary embolism (HCC)   Hypoxia   Hypokalemia   CKD (chronic kidney disease)   Acute pulmonary emboli -CTPA findings as outlined in above narrative.  Patient had recently been taken off Eliquis for prior PE due to recurrent falls and concern for bleeding risk.  Patient was hypotensive and hypoxic upon admission. --Continue heparin drip and monitor PTT per protocol --Vascular surgery said patient is not a good candidate for thrombolysis or thrombectomy because pulmonary emboli bilateral, multiple and diffuse. --Monitor vitals closely Her daughter, Arline Asp, wanted to talk to vascular surgeon about IVC  filter placement.  Acute hypoxic respiratory failure -secondary to acute PE --Continue supplemental oxygen to maintain O2 sat greater than 90%, wean off as tolerated  Hypokalemia -improved. --Hold Lasix for now --Monitor BMP, replace K as needed for goal K>4  HFpEF -euvolemic upon admission.  Discontinue IV fluids to avoid fluid overload.   Lasix can be resumed tomorrow if BP remains stable.   Type 2 diabetes -on sliding scale coverage at SNF --Sliding scale NovoLog and CBGs AC/at bedtime  Chronic kidney disease stage IIIa -baseline creatinine appears to be around 1.1-1.2.  On admission creatinine 1.32.  Monitor renal function closely.  Avoid nephrotoxic agents and renally dose meds as indicated.  Hyperlipidemia - continue pravastatin  Depression/Anxiety - continue Celexa  Dementia - does not appear to have behavioral disturbances.  Monitor    Body mass index is 22.38 kg/m.   Family Communication/Anticipated D/C date and plan/Code Status   DVT prophylaxis: IV heparin Code Status: Full code Family Communication: Plan discussed with her daughter, Arline Asp at the bedside.  I also spoke to Toniann Fail, her daughter over the phone. Disposition Plan: Patient is from the nursing home and plan is to start her back to the nursing home and she is medically stable.  He is still on IV heparin and family is yet to make a decision on IVC filter placement.  Subjective:   Patient has no complaints.  She kept saying she wanted to go home.  She is confused and unable to provide any history.  Objective:    Vitals:   05/26/19 1000 05/26/19 1100 05/26/19 1200 05/26/19 1413  BP: 115/74  114/74 110/69  Pulse:  67 62 68  Resp: (!) 21 (!) 21 20 16   Temp:    98.1 F (36.7 C)  TempSrc:    Oral  SpO2: 92% 98% 100% 94%  Weight:      Height:        Intake/Output Summary (Last 24 hours) at 05/26/2019 1551 Last data filed at 05/26/2019 1300 Gross per 24 hour  Intake 2124.85 ml  Output 300 ml   Net 1824.85 ml   Filed Weights   05/24/19 1917 05/25/19 0000  Weight: 55.1 kg 55.5 kg    Exam:  GEN: NAD SKIN: No rash EYES: EOMI ENT: MMM CV: RRR PULM: CTA B ABD: soft, ND, NT, +BS CNS: AAO x 1 (person), non focal EXT: No edema or tenderness   Data Reviewed:   I have personally reviewed following labs and imaging studies:  Labs: Labs show the following:   Basic Metabolic Panel: Recent Labs  Lab 05/24/19 1922 05/24/19 1922 05/24/19 2210 05/25/19 0632 05/26/19 0456  NA 140  --   --  142 143  K 2.8*   < >  --  3.5 4.2  CL 100  --   --  107 112*  CO2 29  --   --  29 24  GLUCOSE 175*  --   --  111* 103*  BUN 17  --   --  15 15  CREATININE 1.32*  --   --  1.16* 1.13*  CALCIUM 8.5*  --   --  7.9* 8.2*  MG  --   --  2.1  --  2.1   < > = values in this interval not displayed.   GFR Estimated Creatinine Clearance: 28.8 mL/min (A) (by C-G formula based on SCr of 1.13 mg/dL (H)). Liver Function Tests: No results for input(s): AST, ALT, ALKPHOS, BILITOT, PROT, ALBUMIN in the last 168 hours. No results for input(s): LIPASE, AMYLASE in the last 168 hours. No results for input(s): AMMONIA in the last 168 hours. Coagulation profile Recent Labs  Lab 05/24/19 2210  INR 1.1    CBC: Recent Labs  Lab 05/24/19 1922 05/25/19 0632 05/26/19 0456  WBC 10.2 7.7 6.7  HGB 11.3* 9.1* 8.8*  HCT 34.2* 27.4* 27.5*  MCV 87.7 87.5 89.6  PLT 182 156 144*   Cardiac Enzymes: No results for input(s): CKTOTAL, CKMB, CKMBINDEX, TROPONINI in the last 168 hours. BNP (last 3 results) No results for input(s): PROBNP in the last 8760 hours. CBG: Recent Labs  Lab 05/25/19 1605 05/25/19 2116 05/26/19 0727 05/26/19 1139 05/26/19 1421  GLUCAP 96 123* 125* 149* 122*   D-Dimer: No results for input(s): DDIMER in the last 72 hours. Hgb A1c: No results for input(s): HGBA1C in the last 72 hours. Lipid Profile: No results for input(s): CHOL, HDL, LDLCALC, TRIG, CHOLHDL, LDLDIRECT  in the last 72 hours. Thyroid function studies: No results for input(s): TSH, T4TOTAL, T3FREE, THYROIDAB in the last 72 hours.  Invalid input(s): FREET3 Anemia work up: No results for input(s): VITAMINB12, FOLATE, FERRITIN, TIBC, IRON, RETICCTPCT in the last 72 hours. Sepsis Labs: Recent Labs  Lab 05/24/19 1922 05/25/19 0632 05/26/19 0456  WBC 10.2 7.7 6.7    Microbiology Recent Results (from the past 240 hour(s))  Respiratory Panel by RT PCR (Flu A&B, Covid) - Nasopharyngeal Swab     Status: None   Collection Time: 05/24/19 10:36 PM   Specimen: Nasopharyngeal Swab  Result Value Ref Range Status   SARS  Coronavirus 2 by RT PCR NEGATIVE NEGATIVE Final    Comment: (NOTE) SARS-CoV-2 target nucleic acids are NOT DETECTED. The SARS-CoV-2 RNA is generally detectable in upper respiratoy specimens during the acute phase of infection. The lowest concentration of SARS-CoV-2 viral copies this assay can detect is 131 copies/mL. A negative result does not preclude SARS-Cov-2 infection and should not be used as the sole basis for treatment or other patient management decisions. A negative result may occur with  improper specimen collection/handling, submission of specimen other than nasopharyngeal swab, presence of viral mutation(s) within the areas targeted by this assay, and inadequate number of viral copies (<131 copies/mL). A negative result must be combined with clinical observations, patient history, and epidemiological information. The expected result is Negative. Fact Sheet for Patients:  https://www.moore.com/ Fact Sheet for Healthcare Providers:  https://www.young.biz/ This test is not yet ap proved or cleared by the [place] FDA and  has been authorized for detection and/or diagnosis of SARS-CoV-2 by FDA under an Emergency Use Authorization (EUA). This EUA will remain  in effect (meaning this test can be used) for the duration of  the COVID-19 declaration under Section 564(b)(1) of the Act, 21 U.S.C. section 360bbb-3(b)(1), unless the authorization is terminated or revoked sooner.    Influenza A by PCR NEGATIVE NEGATIVE Final   Influenza B by PCR NEGATIVE NEGATIVE Final    Comment: (NOTE) The Xpert Xpress SARS-CoV-2/FLU/RSV assay is intended as an aid in  the diagnosis of influenza from Nasopharyngeal swab specimens and  should not be used as a sole basis for treatment. Nasal washings and  aspirates are unacceptable for Xpert Xpress SARS-CoV-2/FLU/RSV  testing. Fact Sheet for Patients: https://www.moore.com/ Fact Sheet for Healthcare Providers: https://www.young.biz/ This test is not yet approved or cleared by the [place] FDA and  has been authorized for detection and/or diagnosis of SARS-CoV-2 by  FDA under an Emergency Use Authorization (EUA). This EUA will remain  in effect (meaning this test can be used) for the duration of the  Covid-19 declaration under Section 564(b)(1) of the Act, 21  U.S.C. section 360bbb-3(b)(1), unless the authorization is  terminated or revoked. Performed at [facility], [address] [place], [place] [address]   MRSA PCR Screening     Status: None   Collection Time: 05/25/19 12:13 AM   Specimen: Nasopharyngeal  Result Value Ref Range Status   MRSA by PCR NEGATIVE NEGATIVE Final    Comment:        The GeneXpert MRSA Assay (FDA approved for NASAL specimens only), is one component of a comprehensive MRSA colonization surveillance program. It is not intended to diagnose MRSA infection nor to guide or monitor treatment for MRSA infections. Performed at [facility], [address] [place], [place] [address]     Procedures and diagnostic studies:  DG Chest 1 View  Result Date: 05/24/2019 CLINICAL DATA:  Syncope EXAM: CHEST  1 VIEW COMPARISON:  CT same day, April 12, 2019 FINDINGS: The heart size and  mediastinal contours are within normal limits. Aortic knob calcifications. Both lungs are clear. The visualized skeletal structures are unremarkable. IMPRESSION: No active disease. Electronically Signed   By: Jonna Clark M.D.   On: 05/24/2019 22:22   CT Angio Chest PE W/Cm &/Or Wo Cm  Result Date: 05/24/2019 CLINICAL DATA:  Hypoxia, syncope EXAM: CT ANGIOGRAPHY CHEST WITH CONTRAST TECHNIQUE: Multidetector CT imaging of the chest was performed using the standard protocol during bolus administration of intravenous contrast. Multiplanar CT image reconstructions  and MIPs were obtained to evaluate the vascular anatomy. CONTRAST:  78mL OMNIPAQUE IOHEXOL 350 MG/ML SOLN COMPARISON:  04/12/2019 FINDINGS: Cardiovascular: This is a technically adequate evaluation of the pulmonary vasculature. Diffuse bilateral pulmonary emboli are seen within all lobar distributions, as well as a large embolus persisting in the right main pulmonary artery. Significant clot burden. Dilated right ventricle with RV/LV ratio measuring 1.6. The heart is not enlarged. Significant atherosclerosis of the thoracic aorta. Aberrant origin of the right subclavian artery is noted, a frequent anatomic variant. No evidence of thoracic aortic aneurysm or dissection. Mediastinum/Nodes: No enlarged mediastinal, hilar, or axillary lymph nodes. Thyroid gland, trachea, and esophagus demonstrate no significant findings. Lungs/Pleura: Mild upper lobe predominant emphysema. No airspace disease, effusion, or pneumothorax. The central airways are patent. Upper Abdomen: No acute abnormality. Musculoskeletal: No acute or destructive bony lesions. Reconstructed images demonstrate no additional findings. Review of the MIP images confirms the above findings. IMPRESSION: 1. Diffuse bilateral pulmonary emboli with CTevidence of right heart strain (RV/LV Ratio = 1.6) consistent with at least submassive (intermediate risk) PE. The presence of right heart strain has been  associated with an increased risk of morbidity and mortality. 2.  Aortic Atherosclerosis (ICD10-I70.0). 3. Emphysema (ICD10-J43.9). These results were called by telephone at the time of interpretation on 05/24/2019 at 9:56 pm to provider [facility] , who verbally acknowledged these results. Electronically Signed   By: Randa Ngo M.D.   On: 05/24/2019 21:56    Medications:   . Chlorhexidine Gluconate Cloth  6 each Topical Daily  . citalopram  10 mg Oral Daily  . insulin aspart  0-9 Units Subcutaneous TID WC  . mouth rinse  15 mL Mouth Rinse BID  . pravastatin  20 mg Oral q1800   Continuous Infusions: . heparin 650 Units/hr (05/26/19 1300)     LOS: 2 days   Kaly Mcquary  [facility]     05/26/2019, 3:51 PM

## 2019-05-26 NOTE — Plan of Care (Signed)

## 2019-05-26 NOTE — Progress Notes (Addendum)
Jasmine Hubbard # [id number] pt daughter wants to get called about the plan procedure. Arline Asp who is also one of pts daughter states that nobody call them about the procedure. Will notify incoming shift. Will contineu to monitor.

## 2019-05-26 NOTE — Progress Notes (Signed)
ANTICOAGULATION CONSULT NOTE  Pharmacy Consult for heparin Indication: pulmonary embolus  Allergies  Allergen Reactions  . Naproxen Hives and Shortness Of Breath    Patient Measurements: Height: 5\' 2"  (157.5 cm) Weight: 122 lb 5.7 oz (55.5 kg) IBW/kg (Calculated) : 50.1 Heparin Dosing Weight: 55 kg  Vital Signs: Temp: 97.6 F (36.4 C) (02/27 2000) Temp Source: Oral (02/27 2000) BP: 117/76 (02/28 0100) Pulse Rate: 67 (02/28 0100)  Labs: Recent Labs    05/24/19 1922 05/24/19 2210 05/24/19 2210 05/25/19 0632 05/25/19 1718 05/26/19 0021  HGB 11.3*  --   --  9.1*  --   --   HCT 34.2*  --   --  27.4*  --   --   PLT 182  --   --  156  --   --   APTT  --  32  --   --   --   --   LABPROT  --  14.5  --   --   --   --   INR  --  1.1  --   --   --   --   HEPARINUNFRC  --  <0.10*   < > 0.96* 0.81* 0.51  CREATININE 1.32*  --   --  1.16*  --   --   TROPONINIHS  --  81*  --   --   --   --    < > = values in this interval not displayed.    Estimated Creatinine Clearance: 28 mL/min (A) (by C-G formula based on SCr of 1.16 mg/dL (H)).   Medical History: Past Medical History:  Diagnosis Date  . CHF (congestive heart failure) (HCC)   . Diabetes mellitus without complication (HCC)   . Left ventricular failure (HCC)   . Osteoporosis   . Pulmonary embolism (HCC)   . Pulmonary embolus (HCC)   . Vascular dementia (HCC)     Medications:  Scheduled:  . Chlorhexidine Gluconate Cloth  6 each Topical Daily  . citalopram  10 mg Oral Daily  . insulin aspart  0-9 Units Subcutaneous TID WC  . mouth rinse  15 mL Mouth Rinse BID  . pravastatin  20 mg Oral q1800    Assessment: Patient arrives s/t fall from toilet at [facility] house w/ syncopal episode. Patient w/ h/o DM, CHF, PE admitted w/ PE on CT w/ R heart strain. Patient is on eliquis 2.5 mg bid PTA, last dose not recorded. Patient is being started on a heparin drip for management of pulmonary embolism, baseline CBC WNL.  2/27  1743 HL 0.81 supratherapeutic and confirmed with nurse no bleeding concerns.   Goal of Therapy:  Heparin level 0.3-0.7 units/ml Monitor platelets by anticoagulation protocol: Yes   Plan:  02/28 @ 0000 HL 0.51 therapeutic. Will continue current rate and will recheck HL at 0800 will continue to monitor.  3/28, PharmD, BCPS Clinical Pharmacist 05/26/2019,1:24 AM

## 2019-05-27 ENCOUNTER — Encounter: Payer: Self-pay | Admitting: Cardiology

## 2019-05-27 ENCOUNTER — Other Ambulatory Visit (INDEPENDENT_AMBULATORY_CARE_PROVIDER_SITE_OTHER): Payer: Self-pay | Admitting: Vascular Surgery

## 2019-05-27 ENCOUNTER — Encounter
Disposition: A | Discharge status: Home Health | Payer: Self-pay | Source: Skilled Nursing Facility | Attending: Internal Medicine

## 2019-05-27 DIAGNOSIS — I2692 Saddle embolus of pulmonary artery without acute cor pulmonale: Secondary | ICD-10-CM

## 2019-05-27 DIAGNOSIS — I2699 Other pulmonary embolism without acute cor pulmonale: Secondary | ICD-10-CM

## 2019-05-27 HISTORY — PX: IVC FILTER INSERTION: CATH118245

## 2019-05-27 LAB — GLUCOSE, CAPILLARY
Glucose-Capillary: 106 mg/dL — ABNORMAL HIGH (ref 70–99)
Glucose-Capillary: 103 mg/dL — ABNORMAL HIGH (ref 70–99)
Glucose-Capillary: 109 mg/dL — ABNORMAL HIGH (ref 70–99)
Glucose-Capillary: 142 mg/dL — ABNORMAL HIGH (ref 70–99)
Glucose-Capillary: 91 mg/dL (ref 70–99)
Glucose-Capillary: 89 mg/dL (ref 70–99)
Glucose-Capillary: 86 mg/dL (ref 70–99)

## 2019-05-27 LAB — BASIC METABOLIC PANEL
Anion gap: 5 (ref 5–15)
BUN: 13 mg/dL (ref 8–23)
CO2: 27 mmol/L (ref 22–32)
Calcium: 8.2 mg/dL — ABNORMAL LOW (ref 8.9–10.3)
Chloride: 110 mmol/L (ref 98–111)
Creatinine, Ser: 1.07 mg/dL — ABNORMAL HIGH (ref 0.44–1.00)
GFR calc Af Amer: 55 mL/min — ABNORMAL LOW (ref >60)
GFR calc non Af Amer: 47 mL/min — ABNORMAL LOW (ref >60)
Glucose, Bld: 105 mg/dL — ABNORMAL HIGH (ref 70–99)
Potassium: 3.7 mmol/L (ref 3.5–5.1)
Sodium: 142 mmol/L (ref 135–145)

## 2019-05-27 LAB — CBC WITH DIFFERENTIAL/PLATELET
Abs Immature Granulocytes: 0.04 10*3/uL (ref 0.00–0.07)
Basophils Absolute: 0.1 10*3/uL (ref 0.0–0.1)
Basophils Relative: 1 %
Eosinophils Absolute: 0.2 10*3/uL (ref 0.0–0.5)
Eosinophils Relative: 4 %
HCT: 27 % — ABNORMAL LOW (ref 36.0–46.0)
Hemoglobin: 8.7 g/dL — ABNORMAL LOW (ref 12.0–15.0)
Immature Granulocytes: 1 %
Lymphocytes Relative: 24 %
Lymphs Abs: 1.5 10*3/uL (ref 0.7–4.0)
MCH: 28.6 pg (ref 26.0–34.0)
MCHC: 32.2 g/dL (ref 30.0–36.0)
MCV: 88.8 fL (ref 80.0–100.0)
Monocytes Absolute: 0.7 10*3/uL (ref 0.1–1.0)
Monocytes Relative: 11 %
Neutro Abs: 3.7 10*3/uL (ref 1.7–7.7)
Neutrophils Relative %: 59 %
Platelets: 152 10*3/uL (ref 150–400)
RBC: 3.04 MIL/uL — ABNORMAL LOW (ref 3.87–5.11)
RDW: 13.8 % (ref 11.5–15.5)
WBC: 6.2 10*3/uL (ref 4.0–10.5)
nRBC: 0 % (ref 0.0–0.2)

## 2019-05-27 LAB — HEPARIN LEVEL (UNFRACTIONATED): Heparin Unfractionated: <0.1 IU/mL — ABNORMAL LOW (ref 0.30–0.70)

## 2019-05-27 SURGERY — IVC FILTER INSERTION
Anesthesia: Moderate Sedation

## 2019-05-27 MED ORDER — MIDAZOLAM HCL 5 MG/5ML IJ SOLN
INTRAMUSCULAR | Status: AC
  Filled 2019-05-27: qty 5

## 2019-05-27 MED ORDER — DIPHENHYDRAMINE HCL 50 MG/ML IJ SOLN: 50.0000 mg | Freq: Once | INTRAMUSCULAR | Status: DC | PRN

## 2019-05-27 MED ORDER — ENSURE ENLIVE PO LIQD
237.0000 mL | Freq: Two times a day (BID) | ORAL | Status: DC
  Administered 2019-05-28 (×2): 237 mL via ORAL

## 2019-05-27 MED ORDER — FAMOTIDINE 20 MG PO TABS: 40.0000 mg | ORAL_TABLET | Freq: Once | ORAL | Status: DC | PRN

## 2019-05-27 MED ORDER — HYDROMORPHONE HCL 1 MG/ML IJ SOLN: 1.0000 mg | Freq: Once | INTRAMUSCULAR | Status: DC | PRN

## 2019-05-27 MED ORDER — ADULT MULTIVITAMIN W/MINERALS CH
1.0000 | ORAL_TABLET | Freq: Every day | ORAL | Status: DC
  Administered 2019-05-28 – 2019-05-29 (×2): 1 via ORAL
  Filled 2019-05-27 (×2): qty 1

## 2019-05-27 MED ORDER — CEFAZOLIN SODIUM-DEXTROSE 2-4 GM/100ML-% IV SOLN
INTRAVENOUS | Status: AC
  Administered 2019-05-27: 2 g via INTRAVENOUS
  Filled 2019-05-27: qty 100

## 2019-05-27 MED ORDER — FENTANYL CITRATE (PF) 100 MCG/2ML IJ SOLN
INTRAMUSCULAR | Status: AC
  Filled 2019-05-27: qty 2

## 2019-05-27 MED ORDER — CEFAZOLIN SODIUM-DEXTROSE 2-4 GM/100ML-% IV SOLN: 2.0000 g | Freq: Once | INTRAVENOUS | Status: AC

## 2019-05-27 MED ORDER — IODIXANOL 320 MG/ML IV SOLN
INTRAVENOUS | Status: DC | PRN
  Administered 2019-05-27: 13:00:00 10 mL

## 2019-05-27 MED ORDER — SODIUM CHLORIDE 0.9 % IV SOLN: INTRAVENOUS | Status: DC

## 2019-05-27 MED ORDER — ONDANSETRON HCL 4 MG/2ML IJ SOLN: 4.0000 mg | Freq: Four times a day (QID) | INTRAMUSCULAR | Status: DC | PRN

## 2019-05-27 MED ORDER — FENTANYL CITRATE (PF) 100 MCG/2ML IJ SOLN
INTRAMUSCULAR | Status: DC | PRN
  Administered 2019-05-27: 50 ug via INTRAVENOUS

## 2019-05-27 MED ORDER — MIDAZOLAM HCL 2 MG/ML PO SYRP: 8.0000 mg | ORAL_SOLUTION | Freq: Once | ORAL | Status: DC | PRN

## 2019-05-27 MED ORDER — METHYLPREDNISOLONE SODIUM SUCC 125 MG IJ SOLR: 125.0000 mg | Freq: Once | INTRAMUSCULAR | Status: DC | PRN

## 2019-05-27 MED ORDER — MIDAZOLAM HCL 2 MG/2ML IJ SOLN
INTRAMUSCULAR | Status: DC | PRN
  Administered 2019-05-27: 1 mg via INTRAVENOUS

## 2019-05-27 SURGICAL SUPPLY — 4 items
COVER PROBE U/S 5X48 (MISCELLANEOUS) ×3 IMPLANT
KIT FEMORAL DEL DENALI (Miscellaneous) ×3 IMPLANT
PACK ANGIOGRAPHY (CUSTOM PROCEDURE TRAY) ×3 IMPLANT
WIRE GUIDERIGHT .035X150 (WIRE) ×3 IMPLANT

## 2019-05-27 NOTE — Op Note (Signed)
[facility]   OPERATIVE NOTE    PRE-OPERATIVE DIAGNOSIS: PE with GI bleed  POST-OPERATIVE DIAGNOSIS: same as above  PROCEDURE: 1.   Ultrasound guidance for vascular access to the right femoral vein 2.   Catheter placement into the inferior vena cava 3.   Inferior venacavogram 4.   Placement of a [employer] [place] IVC filter  SURGEON: Festus Barren, MD  ASSISTANT(S): None  ANESTHESIA: local with Moderate Conscious Sedation for approximately 10 minutes using 1 mg of Versed and 50 mcg of Fentanyl  ESTIMATED BLOOD LOSS: minimal  CONTRAST: 15 cc  FLUORO TIME: less than one minute  FINDING(S): 1.  Patent IVC  SPECIMEN(S):  none  INDICATIONS:   Jasmine Hubbard is a 86 y.o. female who presents with PE and a GI bleed.  Inferior vena cava filter is indicated for this reason.  Risks and benefits including filter thrombosis, migration, fracture, bleeding, and infection were all discussed.  We discussed that all IVC filters that we place can be removed if desired from the patient once the need for the filter has passed.    DESCRIPTION: After obtaining full informed written consent, the patient was brought back to the vascular suite. The skin was sterilely prepped and draped in a sterile surgical field was created. Moderate conscious sedation was administered during a face to face encounter with the patient throughout the procedure with my supervision of the RN administering medicines and monitoring the patient's vital signs, pulse oximetry, telemetry and mental status throughout from the start of the procedure until the patient was taken to the recovery room. The right femoral vein was accessed under direct ultrasound guidance without difficulty with a Seldinger needle and a J-wire was then placed. After skin nick and dilatation, the delivery sheath was placed into the inferior vena cava and an inferior venacavogram was performed. This demonstrated a patent IVC with the  level of the renal veins at L1.  The filter was then deployed into the inferior vena cava at the level of L2 just below the renal veins. The delivery sheath was then removed. Pressure was held. Sterile dressings were placed. The patient tolerated the procedure well and was taken to the recovery room in stable condition.  COMPLICATIONS: None  CONDITION: Stable  Festus Barren  05/27/2019, 1:26 PM   This note was created with Dragon Medical transcription system. Any errors in dictation are purely unintentional.

## 2019-05-27 NOTE — Progress Notes (Signed)
Initial Nutrition Assessment  DOCUMENTATION CODES:   Not applicable  INTERVENTION:   Ensure Enlive po BID, each supplement provides 350 kcal and 20 grams of protein  MVI daily   NUTRITION DIAGNOSIS:   Inadequate oral intake related to acute illness as evidenced by meal completion < 50%.  GOAL:   Patient will meet greater than or equal to 90% of their needs  MONITOR:   PO intake, Supplement acceptance, Labs, Weight trends, Skin, I & O's  REASON FOR ASSESSMENT:   Malnutrition Screening Tool    ASSESSMENT:   86 y.o. female with hx dHF, PE (recently taken off anticoagulation due to frequent falls), vascular dementia, who presents to the ED for syncope.   Unable to see patient today as pt in procedure at time of RD visit. Pt NPO for IVC filter today. Per chart, pt eating 25% of meals prior to NPO. RD will add supplements and MVI to help pt meet her estimated needs. Per chart, pt has lost 18lbs(13%) in < 4 months; this is significant. RD suspects pt with poor appetite and oral intake at baseline. RD will obtain nutrition related history and exam at follow-up. Pt is at high risk for malnutrition.   Medications reviewed and include: celexa, insulin, fentanyl, cefazolin, NaCl @75ml /hr  Labs reviewed: creat 1.07(H) Hgb 8.7(L), Hct 27.0(L)  Unable to complete Nutrition-Focused physical exam at this time.   Diet Order:   Diet Order            Diet NPO time specified  Diet effective midnight             EDUCATION NEEDS:   No education needs have been identified at this time  Skin:  Skin Assessment: Reviewed RN Assessment(Ecchymosis)  Last BM:  2/28- TYPE 6  Height:   Ht Readings from Last 1 Encounters:  05/27/19 5\' 2"  (1.575 m)    Weight:   Wt Readings from Last 1 Encounters:  05/27/19 55.5 kg    Ideal Body Weight:  50 kg  BMI:  Body mass index is 22.38 kg/m.  Estimated Nutritional Needs:   Kcal:  1300-1500kcal/day  Protein:  70-75g/day  Fluid:   >1.3L/day  MS, RD, LDN Contact information available in Amion

## 2019-05-27 NOTE — Progress Notes (Signed)
Patient's daughter is at bedside. Both of patient's daughter have spoken with vascular team and agree about the necessity of the procedure. Consent obtained from daughter at bedside. Vitals stable. Breathing even and unlabored. No distress noted. Patient wheeled down at 1136. Returned from procedure at 1440. Breathing even and unlabored. No distress noted. Vitals stable. Right femoral access clean, dry, and intact. No bleeding, hematoma, or pain present at site. All pulses are palpable. Will continue to monitor patient closely.

## 2019-05-27 NOTE — Care Management Important Message (Signed)
Important Message  Patient Details  Name: Jasmine Hubbard MRN: [id number] Date of Birth: November 09, 1931   Medicare Important Message Given:  Yes  Initial Medicare IM given by Patient Access Associate on 05/27/2019 at 10:29am.     [name] 05/27/2019, 11:10 AM

## 2019-05-27 NOTE — Progress Notes (Signed)
PROGRESS NOTE    Jasmine Hubbard  AYT:[id number] DOB: [birth year] DOA: 05/24/2019  PCP: [name], MD    LOS - 3   Brief Narrative:  falls, diabetes, diastolic CHF, who presented to the ED from her nursing home after a syncopal episode that occurred while she was on the commode having diarrhea. Per report, patient did not hit her head or lose consciousness. In the ED, patient hypotensive and hypoxic with O2 sat of 86% on room air which resolved on 2 L/min nasal cannula oxygen. Labs were notable for glucose 175, creatinine 1.32, potassium 2.8, hemoglobin 11.3. CTA chest showed diffuse bilateral pulmonary emboli with CTevidence of right heart strain (RV/LV Ratio = 1.6) consistent with at least submassive (intermediate risk) PE.Heparin drip was started and patient was admitted to stepdown unit on hospitalist service for further evaluation management. Vascular surgery was consulted for consideration of possible thrombolysis or thrombectomy.  Subjective 3/1: Patient seen at bedside this AM.  No acute events reported.  Patient becomes agitated with attempts to examine her today, pushes me away and refuses exam.  Appears in no distress or discomfort at this time.  Assessment & Plan:   Active Problems:   Dementia (HCC)   Diabetes (HCC)   Chronic diastolic CHF (congestive heart failure) (HCC)   Pulmonary embolism (HCC)   Hypoxia   Hypokalemia   CKD (chronic kidney disease)   Acute pulmonary emboli- CTA findings as outlined in above narrative. Patient had recently been taken off Eliquis for prior PE due to recurrent falls and concern for bleeding risk. Patient was hypotensive and hypoxic upon admission.  Patient was placed on heparin drip, but developed GI bleeding.  Heparin stopped.   --IVC filter to be placed today, pending daughter's consent --Monitor vitals closely   Acute hypoxic respiratory failure-secondary to acute PE --Continue supplemental oxygen to maintain  O2 sat greater than 90%, wean off as tolerated  Hypokalemia-improved. --Hold Lasix for now --Monitor BMP, replace K as needed for goal K>4  HFpEF-euvolemic upon admission.  Discontinue IV fluids to avoid fluid overload.  Lasix can be resumed tomorrow if BP remains stable.   Type 2 diabetes-on sliding scale coverage at SNF --Sliding scale NovoLog and CBGs AC/at bedtime  Chronic kidney disease stage IIIa-baseline creatinine appears to be around 1.1-1.2. On admission creatinine 1.32. Monitor renal function closely. Avoid nephrotoxic agents and renally dose meds as indicated.  Hyperlipidemia - continue pravastatin  Depression/Anxiety - continue Celexa  Dementia - does not appear to have behavioral disturbances. Monitor   DVT prophylaxis: SCD's    Code Status: Full Code  Family Communication: none at bedside   Disposition Plan:  Expect d/c back to SNF pending further improvement in hemodynamic stability and evaluation by vascular surgery. Coming From SNF Exp DC Date 1/29        Barriers clinical as above Medically Stable for Discharge? No   Consultants:   Vascular surgery  Procedures:   None  Antimicrobials:   None    Objective: Vitals:   05/26/19 1715 05/26/19 1940 05/27/19 0437 05/27/19 0822  BP: 124/73 131/72 (!) 115/57 119/69  Pulse: 63 82 69 66  Resp: 16 20 20 18   Temp: 98.3 F (36.8 C) 97.6 F (36.4 C) 98.3 F (36.8 C) 98 F (36.7 C)  TempSrc: Oral Oral Oral Oral  SpO2: 95% 92% 99% 99%  Weight:      Height:        Intake/Output Summary (Last 24 hours) at 05/27/2019 07/27/2019 Last  data filed at 05/27/2019 0445 Gross per 24 hour  Intake 638.89 ml  Output 200 ml  Net 438.89 ml   Filed Weights   05/24/19 1917 05/25/19 0000  Weight: 55.1 kg 55.5 kg    Examination:  General exam: awake, alert, no acute distress, frail Respiratory system: CTAB upper anterior lung fields, patient refused further exam, no wheezes, rales or rhonchi, normal  respiratory effort. Cardiovascular system: normal S1/S2, RRR, no pedal edema.   Central nervous system: alert, disoriented, no gross focal neurologic deficits, normal speech Extremities: moves all, no cyanosis, normal tone Skin: dry, intact, normal temperature, normal color Psychiatry: agitated mood, congruent affect, abnormal judgement and insight due to dementia    Data Reviewed: I have personally reviewed following labs and imaging studies  CBC: Recent Labs  Lab 05/24/19 1922 05/25/19 0632 05/26/19 0456 05/27/19 0452  WBC 10.2 7.7 6.7 6.2  NEUTROABS  --   --   --  3.7  HGB 11.3* 9.1* 8.8* 8.7*  HCT 34.2* 27.4* 27.5* 27.0*  MCV 87.7 87.5 89.6 88.8  PLT 182 156 144* 734   Basic Metabolic Panel: Recent Labs  Lab 05/24/19 1922 05/24/19 2210 05/25/19 0632 05/26/19 0456 05/27/19 0452  NA 140  --  142 143 142  K 2.8*  --  3.5 4.2 3.7  CL 100  --  107 112* 110  CO2 29  --  29 24 27   GLUCOSE 175*  --  111* 103* 105*  BUN 17  --  15 15 13   CREATININE 1.32*  --  1.16* 1.13* 1.07*  CALCIUM 8.5*  --  7.9* 8.2* 8.2*  MG  --  2.1  --  2.1  --    GFR: Estimated Creatinine Clearance: 30.4 mL/min (A) (by C-G formula based on SCr of 1.07 mg/dL (H)). Liver Function Tests: No results for input(s): AST, ALT, ALKPHOS, BILITOT, PROT, ALBUMIN in the last 168 hours. No results for input(s): LIPASE, AMYLASE in the last 168 hours. No results for input(s): AMMONIA in the last 168 hours. Coagulation Profile: Recent Labs  Lab 05/24/19 2210  INR 1.1   Cardiac Enzymes: No results for input(s): CKTOTAL, CKMB, CKMBINDEX, TROPONINI in the last 168 hours. BNP (last 3 results) No results for input(s): PROBNP in the last 8760 hours. HbA1C: No results for input(s): HGBA1C in the last 72 hours. CBG: Recent Labs  Lab 05/26/19 1139 05/26/19 1421 05/26/19 1714 05/26/19 2048 05/27/19 0822  GLUCAP 149* 122* 100* 140* 106*   Lipid Profile: No results for input(s): CHOL, HDL, LDLCALC,  TRIG, CHOLHDL, LDLDIRECT in the last 72 hours. Thyroid Function Tests: No results for input(s): TSH, T4TOTAL, FREET4, T3FREE, THYROIDAB in the last 72 hours. Anemia Panel: No results for input(s): VITAMINB12, FOLATE, FERRITIN, TIBC, IRON, RETICCTPCT in the last 72 hours. Sepsis Labs: No results for input(s): PROCALCITON, LATICACIDVEN in the last 168 hours.  Recent Results (from the past 240 hour(s))  Respiratory Panel by RT PCR (Flu A&B, Covid) - Nasopharyngeal Swab     Status: None   Collection Time: 05/24/19 10:36 PM   Specimen: Nasopharyngeal Swab  Result Value Ref Range Status   SARS Coronavirus 2 by RT PCR NEGATIVE NEGATIVE Final    Comment: (NOTE) SARS-CoV-2 target nucleic acids are NOT DETECTED. The SARS-CoV-2 RNA is generally detectable in upper respiratoy specimens during the acute phase of infection. The lowest concentration of SARS-CoV-2 viral copies this assay can detect is 131 copies/mL. A negative result does not preclude SARS-Cov-2 infection and should not  be used as the sole basis for treatment or other patient management decisions. A negative result may occur with  improper specimen collection/handling, submission of specimen other than nasopharyngeal swab, presence of viral mutation(s) within the areas targeted by this assay, and inadequate number of viral copies (<131 copies/mL). A negative result must be combined with clinical observations, patient history, and epidemiological information. The expected result is Negative. Fact Sheet for Patients:  https://www.moore.com/ Fact Sheet for Healthcare Providers:  https://www.young.biz/ This test is not yet ap proved or cleared by the [place] FDA and  has been authorized for detection and/or diagnosis of SARS-CoV-2 by FDA under an Emergency Use Authorization (EUA). This EUA will remain  in effect (meaning this test can be used) for the duration of the COVID-19 declaration  under Section 564(b)(1) of the Act, 21 U.S.C. section 360bbb-3(b)(1), unless the authorization is terminated or revoked sooner.    Influenza A by PCR NEGATIVE NEGATIVE Final   Influenza B by PCR NEGATIVE NEGATIVE Final    Comment: (NOTE) The Xpert Xpress SARS-CoV-2/FLU/RSV assay is intended as an aid in  the diagnosis of influenza from Nasopharyngeal swab specimens and  should not be used as a sole basis for treatment. Nasal washings and  aspirates are unacceptable for Xpert Xpress SARS-CoV-2/FLU/RSV  testing. Fact Sheet for Patients: https://www.moore.com/ Fact Sheet for Healthcare Providers: https://www.young.biz/ This test is not yet approved or cleared by the [place] FDA and  has been authorized for detection and/or diagnosis of SARS-CoV-2 by  FDA under an Emergency Use Authorization (EUA). This EUA will remain  in effect (meaning this test can be used) for the duration of the  Covid-19 declaration under Section 564(b)(1) of the Act, 21  U.S.C. section 360bbb-3(b)(1), unless the authorization is  terminated or revoked. Performed at [facility], [address] [place], [place] [address]   MRSA PCR Screening     Status: None   Collection Time: 05/25/19 12:13 AM   Specimen: Nasopharyngeal  Result Value Ref Range Status   MRSA by PCR NEGATIVE NEGATIVE Final    Comment:        The GeneXpert MRSA Assay (FDA approved for NASAL specimens only), is one component of a comprehensive MRSA colonization surveillance program. It is not intended to diagnose MRSA infection nor to guide or monitor treatment for MRSA infections. Performed at [facility], [address] [place], [place] [address]          Radiology Studies: No results found.      Scheduled Meds: . citalopram  10 mg Oral Daily  . insulin aspart  0-9 Units Subcutaneous TID WC  . mouth rinse  15 mL Mouth Rinse BID  . pravastatin  20 mg Oral  q1800   Continuous Infusions:   LOS: 3 days    Time spent: 30 minutes    Pennie Banter, DO [facility]   If 7PM-7AM, please contact night-coverage [email or web address] 05/27/2019, 8:32 AM

## 2019-05-28 LAB — CBC
HCT: 26.5 % — ABNORMAL LOW (ref 36.0–46.0)
Hemoglobin: 8.5 g/dL — ABNORMAL LOW (ref 12.0–15.0)
MCH: 28.9 pg (ref 26.0–34.0)
MCHC: 32.1 g/dL (ref 30.0–36.0)
MCV: 90.1 fL (ref 80.0–100.0)
Platelets: 146 10*3/uL — ABNORMAL LOW (ref 150–400)
RBC: 2.94 MIL/uL — ABNORMAL LOW (ref 3.87–5.11)
RDW: 13.8 % (ref 11.5–15.5)
WBC: 5.4 10*3/uL (ref 4.0–10.5)
nRBC: 0 % (ref 0.0–0.2)

## 2019-05-28 LAB — BASIC METABOLIC PANEL
Anion gap: 2 — ABNORMAL LOW (ref 5–15)
BUN: 11 mg/dL (ref 8–23)
CO2: 29 mmol/L (ref 22–32)
Calcium: 8 mg/dL — ABNORMAL LOW (ref 8.9–10.3)
Chloride: 113 mmol/L — ABNORMAL HIGH (ref 98–111)
Creatinine, Ser: 1.08 mg/dL — ABNORMAL HIGH (ref 0.44–1.00)
GFR calc Af Amer: 54 mL/min — ABNORMAL LOW (ref >60)
GFR calc non Af Amer: 47 mL/min — ABNORMAL LOW (ref >60)
Glucose, Bld: 88 mg/dL (ref 70–99)
Potassium: 3.7 mmol/L (ref 3.5–5.1)
Sodium: 144 mmol/L (ref 135–145)

## 2019-05-28 LAB — GLUCOSE, CAPILLARY
Glucose-Capillary: 107 mg/dL — ABNORMAL HIGH (ref 70–99)
Glucose-Capillary: 118 mg/dL — ABNORMAL HIGH (ref 70–99)
Glucose-Capillary: 155 mg/dL — ABNORMAL HIGH (ref 70–99)
Glucose-Capillary: 81 mg/dL (ref 70–99)
Glucose-Capillary: 85 mg/dL (ref 70–99)

## 2019-05-28 LAB — MAGNESIUM: Magnesium: 1.9 mg/dL (ref 1.7–2.4)

## 2019-05-28 MED ORDER — LOPERAMIDE HCL 2 MG PO CAPS
4.0000 mg | ORAL_CAPSULE | Freq: Once | ORAL | Status: AC
  Administered 2019-05-28: 4 mg via ORAL
  Filled 2019-05-28: qty 2

## 2019-05-28 MED ORDER — ADULT MULTIVITAMIN W/MINERALS CH: 1.0000 | ORAL_TABLET | Freq: Every day | ORAL | Status: AC

## 2019-05-28 MED ORDER — ENSURE ENLIVE PO LIQD: 237.0000 mL | Freq: Two times a day (BID) | ORAL | 12 refills | Status: AC

## 2019-05-28 NOTE — Discharge Summary (Addendum)
Physician Discharge Summary  Jasmine Hubbard XIP:[id number] DOB: 07/08/1931 DOA: 05/24/2019  PCP: Guillermina City, MD  Admit date: 05/24/2019 Discharge date: 05/29/2019   addendum to discharge summary from 3/2   Admitted From: SNF Disposition:  SNF  Recommendations for Outpatient Follow-up:  1. Follow up with PCP in 1-2 weeks 2. Please obtain BMP/CBC in one week 3. Please follow up with vascular surgery in 2 months  Home Health: No  Equipment/Devices: None   Discharge Condition: Stable  CODE STATUS: Full  Diet recommendation: Regular  Brief/Interim Summary:  Jasmine Hubbard is an 86 y.o. female with medical history of dementia, pulmonary embolism recently taken off Eliquis due to recurrent falls, diabetes, diastolic CHF, who presented to the ED from her nursing home after a syncopal episode that occurred while she was on the commode having diarrhea. Per report, patient did not hit her head or lose consciousness. In the ED, patient hypotensive and hypoxic with O2 sat of 86% on room air which resolved on 2 L/min nasal cannula oxygen. Labs were notable for glucose 175, creatinine 1.32, potassium 2.8, hemoglobin 11.3. CTA chest showed diffuse bilateral pulmonary emboli with CTevidence of right heart strain (RV/LV Ratio = 1.6) consistent with at least submassive (intermediate risk) PE.Heparin drip was started and patient was admitted to stepdown unit on hospitalist service for further evaluation management. Vascular surgery was consulted for consideration of possible thrombolysis or thrombectomy but this was not recommended as patients clots were bilateral and diffuse and not amenable to intervention.  Patient developed GI bleeding on heparin drip, so anticoagulation was stopped.  IVC filter was placed on 05/27/19.  Patient clinically improved and stable to return to her facility this afternoon.  Active problems Acute respiratory failure with hypoxia  secondary to submassive  PE sats remain stable on RA and ambulation. No o2 needed upon discharge.   Type 2 DM, controlled  on SSI  Chronic kidney disease stage 3A Stable at baseline of 1.2.  Severe dementia No acute symptoms   Anxiety and depression  continue celexa  Diarrhea, noninfectious Stable on prn imodium   Discharge Diagnoses: Active Problems:   Dementia ([facility])   Diabetes ([place])   Chronic diastolic CHF (congestive heart failure) ([facility])   Pulmonary embolism ([facility])   Hypoxia   Hypokalemia   CKD (chronic kidney disease)    Discharge Instructions  NO BLOOD THINNERS   Discharge Instructions    Call MD for:   Complete by: As directed    Bleeding, chest pain, progressive shortness of breath   Call MD for:  severe uncontrolled pain   Complete by: As directed    Call MD for:  temperature >100.4   Complete by: As directed    Diet general   Complete by: As directed    Increase activity slowly   Complete by: As directed      Allergies as of 05/28/2019      Reactions   Naproxen Hives, Shortness Of Breath      Medication List    STOP taking these medications   Eliquis 2.5 MG Tabs tablet Generic drug: apixaban     TAKE these medications   acetaminophen 325 MG tablet Commonly known as: TYLENOL Take 650 mg by mouth every 6 (six) hours as needed for mild pain.   alendronate 70 MG tablet Commonly known as: FOSAMAX Take 70 mg by mouth every Friday. Take with a full glass of water on an empty stomach.   Calcium Carb-Cholecalciferol 600-400 MG-UNIT  Tabs Take 1 tablet by mouth 2 (two) times a day.   Calcium Citrate-Vitamin D 250-200 MG-UNIT Tabs Take 2 tablets by mouth at bedtime.   citalopram 10 MG tablet Commonly known as: CELEXA Take 10 mg by mouth daily.   feeding supplement (ENSURE ENLIVE) Liqd Take 237 mLs by mouth 2 (two) times daily between meals. Start taking on: May 29, 2019   furosemide 20 MG tablet Commonly known as: LASIX Take 20 mg by mouth daily.    guaifenesin 100 MG/5ML syrup Commonly known as: ROBITUSSIN Take 200 mg by mouth every 6 (six) hours as needed for cough or congestion. (max 4 doses daily)   insulin aspart 100 UNIT/ML injection Commonly known as: novoLOG Inject 0-9 Units into the skin 3 (three) times daily with meals.   insulin aspart 100 UNIT/ML injection Commonly known as: novoLOG Inject 0-5 Units into the skin at bedtime.   lovastatin 20 MG tablet Commonly known as: MEVACOR Take 40 mg by mouth at bedtime.   magnesium hydroxide 400 MG/5ML suspension Commonly known as: MILK OF MAGNESIA Take 30 mLs by mouth daily as needed for mild constipation.   Mintox 200-200-20 MG/5ML suspension Generic drug: alum & mag hydroxide-simeth Take 30 mLs by mouth every 6 (six) hours as needed for indigestion or heartburn.   multivitamin with minerals Tabs tablet Take 1 tablet by mouth daily. Start taking on: May 29, 2019   neomycin-bacitracin-polymyxin Oint Commonly known as: NEOSPORIN Apply 1 application topically 4 (four) times daily as needed for wound care.   Probiotic 250 MG Caps Take 1 each by mouth 2 (two) times daily.   sodium chloride 0.65 % Soln nasal spray Commonly known as: OCEAN Place 1 spray into both nostrils 3 (three) times daily.      Follow-up Information    Dew, Marlow BaarsJason S, MD Follow up in 2 month(s).   Specialties: Vascular Surgery, Radiology, Interventional Cardiology Why: Can see Dew or [place]ivia [place]. IVC filter. Will need bilateral lower extremity DVT study.  Contact information: [address] [place] [id number] [phone]          Allergies  Allergen Reactions  . Naproxen Hives and Shortness Of Breath    Consultations:  Vascular surgery    Procedures/Studies: DG Chest 1 View  Result Date: 05/24/2019 CLINICAL DATA:  Syncope EXAM: CHEST  1 VIEW COMPARISON:  CT same day, April 12, 2019 FINDINGS: The heart size and mediastinal contours are within normal limits. Aortic knob  calcifications. Both lungs are clear. The visualized skeletal structures are unremarkable. IMPRESSION: No active disease. Electronically Signed   By: Jonna ClarkBindu  Avutu M.D.   On: 05/24/2019 22:22   CT Angio Chest PE W/Cm &/Or Wo Cm  Result Date: 05/24/2019 CLINICAL DATA:  Hypoxia, syncope EXAM: CT ANGIOGRAPHY CHEST WITH CONTRAST TECHNIQUE: Multidetector CT imaging of the chest was performed using the standard protocol during bolus administration of intravenous contrast. Multiplanar CT image reconstructions and MIPs were obtained to evaluate the vascular anatomy. CONTRAST:  60mL OMNIPAQUE IOHEXOL 350 MG/ML SOLN COMPARISON:  04/12/2019 FINDINGS: Cardiovascular: This is a technically adequate evaluation of the pulmonary vasculature. Diffuse bilateral pulmonary emboli are seen within all lobar distributions, as well as a large embolus persisting in the right main pulmonary artery. Significant clot burden. Dilated right ventricle with RV/LV ratio measuring 1.6. The heart is not enlarged. Significant atherosclerosis of the thoracic aorta. Aberrant origin of the right subclavian artery is noted, a frequent anatomic variant. No evidence of thoracic aortic aneurysm or dissection. Mediastinum/Nodes: No  enlarged mediastinal, hilar, or axillary lymph nodes. Thyroid gland, trachea, and esophagus demonstrate no significant findings. Lungs/Pleura: Mild upper lobe predominant emphysema. No airspace disease, effusion, or pneumothorax. The central airways are patent. Upper Abdomen: No acute abnormality. Musculoskeletal: No acute or destructive bony lesions. Reconstructed images demonstrate no additional findings. Review of the MIP images confirms the above findings. IMPRESSION: 1. Diffuse bilateral pulmonary emboli with CTevidence of right heart strain (RV/LV Ratio = 1.6) consistent with at least submassive (intermediate risk) PE. The presence of right heart strain has been associated with an increased risk of morbidity and mortality.  2.  Aortic Atherosclerosis (ICD10-I70.0). 3. Emphysema (ICD10-J43.9). These results were called by telephone at the time of interpretation on 05/24/2019 at 9:56 pm to provider [facility] , who verbally acknowledged these results. Electronically Signed   By: Sharlet Salina M.D.   On: 05/24/2019 21:56   PERIPHERAL VASCULAR CATHETERIZATION  Result Date: 05/27/2019 See op note    IVC filter placed 05/27/19    Subjective:  No overnight events. HR and BP stable. Had 2 loose BM, improved with imodium  Discharge Exam:  Vitals on 3/3 BP 90/63 mmhg Hr 63, regular  tem 37F RR:16  o2 sat 96% on RA  Vitals:   05/28/19 1101 05/28/19 1525  BP: 106/64 (!) 125/95  Pulse: 64 67  Resp: 17 16  Temp: 98.1 F (36.7 C) 97.8 F (36.6 C)  SpO2: 99% 98%   Vitals:   05/28/19 0500 05/28/19 0852 05/28/19 1101 05/28/19 1525  BP: 130/74 (!) 105/54 106/64 (!) 125/95  Pulse: 66 (!) 56 64 67  Resp: 20  17 16   Temp: 98 F (36.7 C) 98 F (36.7 C) 98.1 F (36.7 C) 97.8 F (36.6 C)  TempSrc: Axillary Oral Oral Oral  SpO2: 95% 100% 99% 98%  Weight:      Height:        General: Pt is alert, awake, not in acute distress Cardiovascular: RRR, S1/S2 +, no rubs, no gallops Respiratory: CTA bilaterally, no wheezing, no rhonchi Abdominal: Soft, NT, ND, bowel sounds + Extremities: no edema, no cyanosis    The results of significant diagnostics from this hospitalization (including imaging, microbiology, ancillary and laboratory) are listed below for reference.     Microbiology: Recent Results (from the past 240 hour(s))  Respiratory Panel by RT PCR (Flu A&B, Covid) - Nasopharyngeal Swab     Status: None   Collection Time: 05/24/19 10:36 PM   Specimen: Nasopharyngeal Swab  Result Value Ref Range Status   SARS Coronavirus 2 by RT PCR NEGATIVE NEGATIVE Final    Comment: (NOTE) SARS-CoV-2 target nucleic acids are NOT DETECTED. The SARS-CoV-2 RNA is generally detectable in upper respiratoy specimens  during the acute phase of infection. The lowest concentration of SARS-CoV-2 viral copies this assay can detect is 131 copies/mL. A negative result does not preclude SARS-Cov-2 infection and should not be used as the sole basis for treatment or other patient management decisions. A negative result may occur with  improper specimen collection/handling, submission of specimen other than nasopharyngeal swab, presence of viral mutation(s) within the areas targeted by this assay, and inadequate number of viral copies (<131 copies/mL). A negative result must be combined with clinical observations, patient history, and epidemiological information. The expected result is Negative. Fact Sheet for Patients:  05/26/19 Fact Sheet for Healthcare Providers:  https://www.moore.com/ This test is not yet ap proved or cleared by the https://www.young.biz/ FDA and  has been authorized for detection and/or diagnosis  of SARS-CoV-2 by FDA under an Emergency Use Authorization (EUA). This EUA will remain  in effect (meaning this test can be used) for the duration of the COVID-19 declaration under Section 564(b)(1) of the Act, 21 U.S.C. section 360bbb-3(b)(1), unless the authorization is terminated or revoked sooner.    Influenza A by PCR NEGATIVE NEGATIVE Final   Influenza B by PCR NEGATIVE NEGATIVE Final    Comment: (NOTE) The Xpert Xpress SARS-CoV-2/FLU/RSV assay is intended as an aid in  the diagnosis of influenza from Nasopharyngeal swab specimens and  should not be used as a sole basis for treatment. Nasal washings and  aspirates are unacceptable for Xpert Xpress SARS-CoV-2/FLU/RSV  testing. Fact Sheet for Patients: https://www.moore.com/ Fact Sheet for Healthcare Providers: https://www.young.biz/ This test is not yet approved or cleared by the [place] FDA and  has been authorized for detection and/or diagnosis of  SARS-CoV-2 by  FDA under an Emergency Use Authorization (EUA). This EUA will remain  in effect (meaning this test can be used) for the duration of the  Covid-19 declaration under Section 564(b)(1) of the Act, 21  U.S.C. section 360bbb-3(b)(1), unless the authorization is  terminated or revoked. Performed at [facility], [address] [place], [place] [address]   MRSA PCR Screening     Status: None   Collection Time: 05/25/19 12:13 AM   Specimen: Nasopharyngeal  Result Value Ref Range Status   MRSA by PCR NEGATIVE NEGATIVE Final    Comment:        The GeneXpert MRSA Assay (FDA approved for NASAL specimens only), is one component of a comprehensive MRSA colonization surveillance program. It is not intended to diagnose MRSA infection nor to guide or monitor treatment for MRSA infections. Performed at [facility], [address] [place], [place] [address]      Labs: BNP (last 3 results) Recent Labs    05/24/19 2210  BNP 131.0*   Basic Metabolic Panel: Recent Labs  Lab 05/24/19 1922 05/24/19 2210 05/25/19 0632 05/26/19 0456 05/27/19 0452 05/28/19 0623  NA 140  --  142 143 142 144  K 2.8*  --  3.5 4.2 3.7 3.7  CL 100  --  107 112* 110 113*  CO2 29  --  29 24 27 29   GLUCOSE 175*  --  111* 103* 105* 88  BUN 17  --  15 15 13 11   CREATININE 1.32*  --  1.16* 1.13* 1.07* 1.08*  CALCIUM 8.5*  --  7.9* 8.2* 8.2* 8.0*  MG  --  2.1  --  2.1  --  1.9   Liver Function Tests: No results for input(s): AST, ALT, ALKPHOS, BILITOT, PROT, ALBUMIN in the last 168 hours. No results for input(s): LIPASE, AMYLASE in the last 168 hours. No results for input(s): AMMONIA in the last 168 hours. CBC: Recent Labs  Lab 05/24/19 1922 05/25/19 05/26/19 05/26/19 0456 05/27/19 0452 05/28/19 0623  WBC 10.2 7.7 6.7 6.2 5.4  NEUTROABS  --   --   --  3.7  --   HGB 11.3* 9.1* 8.8* 8.7* 8.5*  HCT 34.2* 27.4* 27.5* 27.0* 26.5*  MCV 87.7 87.5 89.6 88.8 90.1  PLT 182 156  144* 152 146*   Cardiac Enzymes: No results for input(s): CKTOTAL, CKMB, CKMBINDEX, TROPONINI in the last 168 hours. BNP: Invalid input(s): POCBNP CBG: Recent Labs  Lab 05/27/19 2110 05/27/19 2211 05/28/19 0031 05/28/19 0854 05/28/19 1102  GLUCAP 89 86 85 81 107*   D-Dimer No results  for input(s): DDIMER in the last 72 hours. Hgb A1c No results for input(s): HGBA1C in the last 72 hours. Lipid Profile No results for input(s): CHOL, HDL, LDLCALC, TRIG, CHOLHDL, LDLDIRECT in the last 72 hours. Thyroid function studies No results for input(s): TSH, T4TOTAL, T3FREE, THYROIDAB in the last 72 hours.  Invalid input(s): FREET3 Anemia work up No results for input(s): VITAMINB12, FOLATE, FERRITIN, TIBC, IRON, RETICCTPCT in the last 72 hours. Urinalysis    Component Value Date/Time   COLORURINE YELLOW (A) 05/25/2019 1200   APPEARANCEUR HAZY (A) 05/25/2019 1200   LABSPEC >1.046 (H) 05/25/2019 1200   PHURINE 5.0 05/25/2019 1200   GLUCOSEU NEGATIVE 05/25/2019 1200   HGBUR NEGATIVE 05/25/2019 1200   BILIRUBINUR NEGATIVE 05/25/2019 1200   KETONESUR NEGATIVE 05/25/2019 1200   PROTEINUR NEGATIVE 05/25/2019 1200   NITRITE NEGATIVE 05/25/2019 1200   LEUKOCYTESUR SMALL (A) 05/25/2019 1200   Sepsis Labs Invalid input(s): PROCALCITONIN,  WBC,  LACTICIDVEN Microbiology Recent Results (from the past 240 hour(s))  Respiratory Panel by RT PCR (Flu A&B, Covid) - Nasopharyngeal Swab     Status: None   Collection Time: 05/24/19 10:36 PM   Specimen: Nasopharyngeal Swab  Result Value Ref Range Status   SARS Coronavirus 2 by RT PCR NEGATIVE NEGATIVE Final    Comment: (NOTE) SARS-CoV-2 target nucleic acids are NOT DETECTED. The SARS-CoV-2 RNA is generally detectable in upper respiratoy specimens during the acute phase of infection. The lowest concentration of SARS-CoV-2 viral copies this assay can detect is 131 copies/mL. A negative result does not preclude SARS-Cov-2 infection and should not  be used as the sole basis for treatment or other patient management decisions. A negative result may occur with  improper specimen collection/handling, submission of specimen other than nasopharyngeal swab, presence of viral mutation(s) within the areas targeted by this assay, and inadequate number of viral copies (<131 copies/mL). A negative result must be combined with clinical observations, patient history, and epidemiological information. The expected result is Negative. Fact Sheet for Patients:  https://www.moore.com/ Fact Sheet for Healthcare Providers:  https://www.young.biz/ This test is not yet ap proved or cleared by the [place] FDA and  has been authorized for detection and/or diagnosis of SARS-CoV-2 by FDA under an Emergency Use Authorization (EUA). This EUA will remain  in effect (meaning this test can be used) for the duration of the COVID-19 declaration under Section 564(b)(1) of the Act, 21 U.S.C. section 360bbb-3(b)(1), unless the authorization is terminated or revoked sooner.    Influenza A by PCR NEGATIVE NEGATIVE Final   Influenza B by PCR NEGATIVE NEGATIVE Final    Comment: (NOTE) The Xpert Xpress SARS-CoV-2/FLU/RSV assay is intended as an aid in  the diagnosis of influenza from Nasopharyngeal swab specimens and  should not be used as a sole basis for treatment. Nasal washings and  aspirates are unacceptable for Xpert Xpress SARS-CoV-2/FLU/RSV  testing. Fact Sheet for Patients: https://www.moore.com/ Fact Sheet for Healthcare Providers: https://www.young.biz/ This test is not yet approved or cleared by the [place] FDA and  has been authorized for detection and/or diagnosis of SARS-CoV-2 by  FDA under an Emergency Use Authorization (EUA). This EUA will remain  in effect (meaning this test can be used) for the duration of the  Covid-19 declaration under Section 564(b)(1) of  the Act, 21  U.S.C. section 360bbb-3(b)(1), unless the authorization is  terminated or revoked. Performed at [facility], [address] [place], [place] [address]   MRSA PCR Screening     Status:  None   Collection Time: 05/25/19 12:13 AM   Specimen: Nasopharyngeal  Result Value Ref Range Status   MRSA by PCR NEGATIVE NEGATIVE Final    Comment:        The GeneXpert MRSA Assay (FDA approved for NASAL specimens only), is one component of a comprehensive MRSA colonization surveillance program. It is not intended to diagnose MRSA infection nor to guide or monitor treatment for MRSA infections. Performed at [facility], [address], [place], [place] [address]      Time coordinating discharge: 35 minutes  SIGNED:   Pennie Banter, DO [facility] 05/28/2019, 3:55 PM   If 7PM-7AM, please contact night-coverage [email or web address]

## 2019-05-28 NOTE — Progress Notes (Signed)
Alerted Md that patient has had 2 watery/loose stools. MD stated she would order immodium, no further orders.

## 2019-05-28 NOTE — Plan of Care (Signed)
  Problem: Pain Managment: Goal: General experience of comfort will improve Outcome: Progressing   

## 2019-05-28 NOTE — Progress Notes (Signed)
[facility] Daily Progress Note   Subjective: 1 Day Post-Op: 1.   Ultrasound guidance for vascular access to the right femoral vein 2.   Catheter placement into the inferior vena cava 3.   Inferior venacavogram 4.   Placement of a [employer] IVC filter  Patient without complaint this AM. No issues overnight.   Objective: Vitals:   05/27/19 1535 05/27/19 1950 05/28/19 0500 05/28/19 0852  BP: 112/69 129/69 130/74 (!) 105/54  Pulse: 62 62 66 (!) 56  Resp: 18  20   Temp: 97.8 F (36.6 C) 97.8 F (36.6 C) 98 F (36.7 C) 98 F (36.7 C)  TempSrc: Oral Oral Axillary Oral  SpO2: 95% 100% 95% 100%  Weight:      Height:        Intake/Output Summary (Last 24 hours) at 05/28/2019 1044 Last data filed at 05/28/2019 0131 Gross per 24 hour  Intake 225 ml  Output 200 ml  Net 25 ml   Physical Exam: A&Ox1, NAD CV: RRR Pulmonary: CTA Bilaterally Abdomen: Soft, Nontender, Nondistended Right Groin:  Access Site: clean, dry and intact. No signs of swelling or drainage noted.  Vascular:  Bilateral: Warm, non-tender, minimal edema   Laboratory: CBC    Component Value Date/Time   WBC 5.4 05/28/2019 0623   HGB 8.5 (L) 05/28/2019 0623   HCT 26.5 (L) 05/28/2019 0623   PLT 146 (L) 05/28/2019 0623   BMET    Component Value Date/Time   NA 144 05/28/2019 0623   K 3.7 05/28/2019 0623   CL 113 (H) 05/28/2019 0623   CO2 29 05/28/2019 0623   GLUCOSE 88 05/28/2019 0623   BUN 11 05/28/2019 0623   CREATININE 1.08 (H) 05/28/2019 0623   CALCIUM 8.0 (L) 05/28/2019 0623   GFRNONAA 47 (L) 05/28/2019 0623   GFRAA 54 (L) 05/28/2019 3491   Assessment/Planning: The patient is an 86 year old female presented with bilateral PE status post IVC filter placement - POD#1  1) Groin access site clean, dry and intact 2) Will see in the outpatient setting for continued care  Discussed with Dr. Wallis Mart [facility] PA-C 05/28/2019 10:44 AM

## 2019-05-28 NOTE — Progress Notes (Signed)
SATURATION QUALIFICATIONS: (This note is used to comply with regulatory documentation for home oxygen)  Patient Saturations on Room Air at Rest = 97%  Patient Saturations on Room Air while Ambulating = %: Not applicable   Patient Saturations on  Liters of oxygen while Ambulating = % Not applicable   Please briefly explain why patient needs home oxygen:   Patient does not need oxygen at rest. Physical therapy has not seen patient, and patient is very weak, so cannot ambulate to see if oxygen would be needed with ambulation.

## 2019-05-29 DIAGNOSIS — J9601 Acute respiratory failure with hypoxia: Secondary | ICD-10-CM

## 2019-05-29 DIAGNOSIS — R55 Syncope and collapse: Secondary | ICD-10-CM

## 2019-05-29 LAB — GLUCOSE, CAPILLARY
Glucose-Capillary: 109 mg/dL — ABNORMAL HIGH (ref 70–99)
Glucose-Capillary: 100 mg/dL — ABNORMAL HIGH (ref 70–99)
Glucose-Capillary: 158 mg/dL — ABNORMAL HIGH (ref 70–99)

## 2019-05-29 LAB — SARS CORONAVIRUS 2 (TAT 6-24 HRS): SARS Coronavirus 2: NEGATIVE

## 2019-05-29 MED ORDER — LOPERAMIDE HCL 2 MG PO TABS: 2.0000 mg | ORAL_TABLET | Freq: Four times a day (QID) | ORAL | 0 refills | Status: AC | PRN

## 2019-05-29 NOTE — Evaluation (Signed)
Physical Therapy Evaluation Patient Details Name: Jasmine Hubbard MRN: [id number] DOB: Mar 21, 1932 Today's Date: 05/29/2019   History of Present Illness  Pt admitted for complaints of syncopal event and diagnosed with B PE. Pt is now s/p IVC filter on 05/27/19. History includes fall, DM, CHF, and dementia. Pt confused at baseline.  Clinical Impression  Pt is a pleasantly confused 86 year old female who was admitted for syncopal event secondary to B PE. Now s/p IVC filter placement. Pt confused at baseline, however agreeable to performing therapy this date. Pt performs bed mobility, transfers, and ambulation with min assist. Needs assist to initiate movement, however is able to follow commands. Pt demonstrates deficits with strength/cognition/mobility. Would benefit from skilled PT to address above deficits and promote optimal return to PLOF. Recommend transition to [facility] upon discharge from acute hospitalization. SaO2 on room air at rest = 95% SaO2 on room air while ambulating = 93% SaO2 on n/a liters of O2 while ambulating = n/a%     Follow Up Recommendations Home health PT;Supervision/Assistance - 24 hour(at ALF)    Equipment Recommendations  None recommended by PT    Recommendations for Other Services       Precautions / Restrictions Precautions Precautions: Fall Restrictions Weight Bearing Restrictions: No      Mobility  Bed Mobility Overal bed mobility: Needs Assistance Bed Mobility: Supine to Sit     Supine to sit: Min assist     General bed mobility comments: able to follow commands to initiate bed mobility. Needs HHA for reaching EOB. Once seated, able to maintain upright posture.  Transfers Overall transfer level: Needs assistance Equipment used: Rolling walker (2 wheeled) Transfers: Sit to/from Stand Sit to Stand: Min assist         General transfer comment: assist to stand with upright posture. Cues for hand placement on RW. Once standing, upright  posture noted. All mobility performed on RA with sats WNL  Ambulation/Gait Ambulation/Gait assistance: Min assist Gait Distance (Feet): 3 Feet Assistive device: Rolling walker (2 wheeled) Gait Pattern/deviations: Step-to pattern     General Gait Details: ambulated over to recliner. Has difficulty with sequencing to take steps backward to recliner. Recliner brought up close to patient. no LOB noted  Stairs            Wheelchair Mobility    Modified Rankin (Stroke Patients Only)       Balance Overall balance assessment: Needs assistance Sitting-balance support: Feet supported Sitting balance-Leahy Scale: Good     Standing balance support: Bilateral upper extremity supported Standing balance-Leahy Scale: Fair Standing balance comment: used RW                             Pertinent Vitals/Pain Pain Assessment: No/denies pain    Home Living Family/patient expects to be discharged to:: Assisted living               Home Equipment: None Additional Comments: Pt is unreliable historian. Reports she doesn't use AD at baseline, however seemed familar with concept of RW.    Prior Function Level of Independence: Needs assistance         Comments: Currently at ALF, unsure of amount of assistance pt needs at baseline. Due to confusion, would assume pt needs assist for all ADLs.     Hand Dominance        Extremity/Trunk Assessment   Upper Extremity Assessment Upper Extremity Assessment: Generalized weakness(B UE grossly  3+/5)    Lower Extremity Assessment Lower Extremity Assessment: Generalized weakness(B LE grossly 4/5)       Communication   Communication: HOH  Cognition Arousal/Alertness: Awake/alert Behavior During Therapy: WFL for tasks assessed/performed Overall Cognitive Status: History of cognitive impairments - at baseline                                        General Comments      Exercises Other  Exercises Other Exercises: Supine ther-ex performed on B LE including SLRs, hip abd/add, and seated LAQ. All ther-ex performed x 10 reps with cga and safe technique.   Assessment/Plan    PT Assessment Patient needs continued PT services  PT Problem List Decreased strength;Decreased activity tolerance;Decreased balance;Decreased mobility;Decreased cognition       PT Treatment Interventions Gait training;Therapeutic exercise;Balance training;DME instruction    PT Goals (Current goals can be found in the Care Plan section)  Acute Rehab PT Goals Patient Stated Goal: unable to state PT Goal Formulation: Patient unable to participate in goal setting Time For Goal Achievement: 06/12/19 Potential to Achieve Goals: Good    Frequency Min 2X/week   Barriers to discharge        Co-evaluation               AM-PAC PT "6 Clicks" Mobility  Outcome Measure Help needed turning from your back to your side while in a flat bed without using bedrails?: A Little Help needed moving from lying on your back to sitting on the side of a flat bed without using bedrails?: A Little Help needed moving to and from a bed to a chair (including a wheelchair)?: A Little Help needed standing up from a chair using your arms (e.g., wheelchair or bedside chair)?: A Little Help needed to walk in hospital room?: A Lot Help needed climbing 3-5 steps with a railing? : Total 6 Click Score: 15    End of Session Equipment Utilized During Treatment: Gait belt Activity Tolerance: Patient tolerated treatment well Patient left: in chair;with chair alarm set Nurse Communication: Mobility status PT Visit Diagnosis: Muscle weakness (generalized) (M62.81);Difficulty in walking, not elsewhere classified (R26.2)    Time: 4982-6415 PT Time Calculation (min) (ACUTE ONLY): 25 min   Charges:   PT Evaluation $PT Eval Moderate Complexity: 1 Mod PT Treatments $Therapeutic Exercise: 8-22 mins        Jasmine Hubbard, PT,  DPT [phone]   Jasmine Hubbard 05/29/2019, 11:13 AM

## 2019-05-29 NOTE — NC FL2 (Addendum)
[place] LEVEL OF CARE SCREENING TOOL     IDENTIFICATION  Patient Name: Jasmine Hubbard Birthdate: 08/20/1931 Sex: female Admission Date (Current Location): 05/24/2019  [place] and [place] Number:  Engineering geologist and Address:  [facility], [address], [place], [place] [address]      Provider Number: [id number]  Attending Physician Name and Address:  Louellen Molder, MD  Relative Name and Phone Number:       Current Level of Care: Hospital Recommended Level of Care: [place] Prior Approval Number:    Date Approved/Denied:   PASRR Number:    Discharge Plan: Other (Comment)(Assisted Living Facility)    Current Diagnoses: Patient Active Problem List   Diagnosis Date Noted  . Pulmonary embolism ([place]) 05/24/2019  . Hypoxia 05/24/2019  . Hypokalemia 05/24/2019  . CKD (chronic kidney disease) 05/24/2019  . Dementia ([place]) 04/12/2019  . Acute metabolic encephalopathy 73/71/0626  . Diabetes ([place]) 04/12/2019  . Chronic diastolic CHF (congestive heart failure) ([place]) 04/12/2019  . UTI (urinary tract infection) 04/12/2019    Orientation RESPIRATION BLADDER Height & Weight     Self, Situation, Place  O2 Incontinent Weight: 55.5 kg Height:  5\' 2"  (157.5 cm)  BEHAVIORAL SYMPTOMS/MOOD NEUROLOGICAL BOWEL NUTRITION STATUS      Incontinent    AMBULATORY STATUS COMMUNICATION OF NEEDS Skin   Limited Assist Verbally Normal                       Personal Care Assistance Level of Assistance  Bathing, Dressing Bathing Assistance: Limited assistance   Dressing Assistance: Limited assistance     Functional Limitations Info  Hearing, Sight Sight Info: Adequate Hearing Info: Adequate      SPECIAL CARE FACTORS FREQUENCY                       Contractures Contractures Info: Not present    Additional Factors Info  Allergies   Allergies Info: naproxen           Current Medications  (05/29/2019):  This is the current hospital active medication list Current Facility-Administered Medications  Medication Dose Route Frequency Provider Last Rate Last Admin  . citalopram (CELEXA) tablet 10 mg  10 mg Oral Daily Algernon Huxley, MD   10 mg at 05/29/19 0932  . feeding supplement (ENSURE ENLIVE) (ENSURE ENLIVE) liquid 237 mL  237 mL Oral BID BM Stegmayer, Kimberly A, PA-C   237 mL at 05/28/19 1330  . insulin aspart (novoLOG) injection 0-9 Units  0-9 Units Subcutaneous TID WC Algernon Huxley, MD   2 Units at 05/28/19 1715  . MEDLINE mouth rinse  15 mL Mouth Rinse BID Algernon Huxley, MD   15 mL at 05/29/19 0933  . multivitamin with minerals tablet 1 tablet  1 tablet Oral Daily Stegmayer, Kimberly A, PA-C   1 tablet at 05/29/19 0932  . ondansetron (ZOFRAN) injection 4 mg  4 mg Intravenous Q6H PRN Algernon Huxley, MD      . pravastatin (PRAVACHOL) tablet 20 mg  20 mg Oral q1800 Algernon Huxley, MD   20 mg at 05/28/19 1715     Discharge Medications: STOP taking these medications   Eliquis 2.5 MG Tabs tablet Generic drug: apixaban     TAKE these medications   acetaminophen 325 MG tablet Commonly known as: TYLENOL Take 650 mg by mouth every 6 (six) hours as needed for mild pain.  alendronate 70 MG tablet Commonly known as: FOSAMAX Take 70 mg by mouth every Friday. Take with a full glass of water on an empty stomach.   Calcium Carb-Cholecalciferol 600-400 MG-UNIT Tabs Take 1 tablet by mouth 2 (two) times a day.   Calcium Citrate-Vitamin D 250-200 MG-UNIT Tabs Take 2 tablets by mouth at bedtime.   citalopram 10 MG tablet Commonly known as: CELEXA Take 10 mg by mouth daily.   feeding supplement (ENSURE ENLIVE) Liqd Take 237 mLs by mouth 2 (two) times daily between meals. Start taking on: May 29, 2019   furosemide 20 MG tablet Commonly known as: LASIX Take 20 mg by mouth daily.   guaifenesin 100 MG/5ML syrup Commonly known as: ROBITUSSIN Take 200 mg by mouth every 6 (six)  hours as needed for cough or congestion. (max 4 doses daily)   insulin aspart 100 UNIT/ML injection Commonly known as: novoLOG Inject 0-9 Units into the skin 3 (three) times daily with meals.   insulin aspart 100 UNIT/ML injection Commonly known as: novoLOG Inject 0-5 Units into the skin at bedtime.   lovastatin 20 MG tablet Commonly known as: MEVACOR Take 40 mg by mouth at bedtime.   magnesium hydroxide 400 MG/5ML suspension Commonly known as: MILK OF MAGNESIA Take 30 mLs by mouth daily as needed for mild constipation.   Mintox 200-200-20 MG/5ML suspension Generic drug: alum & mag hydroxide-simeth Take 30 mLs by mouth every 6 (six) hours as needed for indigestion or heartburn.   multivitamin with minerals Tabs tablet Take 1 tablet by mouth daily. Start taking on: May 29, 2019   neomycin-bacitracin-polymyxin Oint Commonly known as: NEOSPORIN Apply 1 application topically 4 (four) times daily as needed for wound care.   Probiotic 250 MG Caps Take 1 each by mouth 2 (two) times daily.   sodium chloride 0.65 % Soln nasal spray Commonly known as: OCEAN Place 1 spray into both nostrils 3 (three) times daily.      Relevant Imaging Results:  Relevant Lab Results:   Additional Information    Shawn Route, RN

## 2019-05-29 NOTE — TOC Transition Note (Signed)
Transition of Care [facility]) - CM/SW Discharge Note   Patient Details  Name: Jasmine Hubbard MRN: [id number] Date of Birth: August 27, 1931  [facility] of [facility]) CM/SW Contact:  Shawn Route, RN Phone Number: 05/29/2019, 11:09 AM   Clinical Narrative:     Patient discharging back to [facility] today.  She will continue HH orders with [facility].  Fl2 faxed to facility.  Daughter will transport patient back to facility. No further TOC needs at this time, please re-consult for new needs.     Final next level of care: Assisted Living(with [facility] services) Barriers to Discharge: Barriers Resolved   Patient Goals and CMS Choice     Choice offered to / list presented to : Patient  Discharge Placement                       Discharge Plan and Services                    Date DME Agency Contacted: 05/29/19 Time DME Agency Contacted: [phone]   HH Arranged: RN, PT HH Agency: [facility] (formerly [facility]) Date HH Agency Contacted: 05/29/19 Time HH Agency Contacted: 1108 Representative spoke with at [facility] Agency: Rosey Bath  Social Determinants of Health (SDOH) Interventions     Readmission Risk Interventions Readmission Risk Prevention Plan 04/16/2019 04/15/2019  Transportation Screening Complete Complete  PCP or Specialist Appt within 3-5 Days - Complete  HRI or Home Care Consult Complete -  Palliative Care Screening Not Applicable Not Applicable  Medication Review (RN Care Manager) Complete Complete  Some recent data might be hidden

## 2019-05-29 NOTE — Progress Notes (Addendum)
PROGRESS NOTE                                                                                                                                                                                                             Patient Demographics:    Jasmine Hubbard, is a 86 y.o. female, DOB - 04-21-1931, WVP:[id number]  Admit date - 05/24/2019   Admitting Physician Lurene Shadow, MD  Outpatient Primary MD for the patient is [name], MD  LOS - 5  This progress note is billed as discharge summary  Chief Complaint  Patient presents with  . Loss of Consciousness       Brief Narrative   86 y/o demented female with PE, recently taken off eliquis due to falls admitted with b/l LE and respiratory failure. Underwent IVC filter placement on 3/1.   Subjective:   No overnight events, confused at baseline and hard of hearing. BP and HR stable. o2 sats in mid 90s on RA   Assessment  & Plan :    Principal Problem:  bilateral submassive Pulmonary embolism (HCC) Presented with acute hypoxic respiratory failure.  Vascular surgery was consulted for consideration of possible thrombolysis or thrombectomy but this was not recommended as patients clots were bilateral and diffuse and not amenable to intervention.  Patient developed GI bleeding on heparin drip, so anticoagulation was stopped.  IVC filter was placed on 05/27/19. Stable on tele.  D/c to SNF  Acute respiratory failure with hypoxia  secondary to submassive PE sats remain stable on RA. Has not ambulate and need ot monitor o2 sat upon ambulation at the SNF.   Active Problems:     Type 2 DM, controlled  on SSI  Chronic kidney disease stage 3A Stable at baseline of 1.2  Severe dementia No acute symptoms   Anxiety and depression  continue celexa  Diarrhea, noninfectious Stable on prn imodium       Code Status : full  Family Communication  :none  Disposition  Plan  :to SNF today    Consults  :  vascular  Procedures  : IVC filter  Lab Results  Component Value Date   PLT 146 (L) 05/28/2019    Antibiotics  :    Anti-infectives (From admission, onward)   Start     Dose/Rate Route Frequency Ordered Stop   05/28/19  0000  ceFAZolin (ANCEF) IVPB 2g/100 mL premix    Note to Pharmacy: To be given in specials   2 g 200 mL/hr over 30 Minutes Intravenous  Once 05/27/19 1157 05/27/19 1618        Objective:   Vitals:   05/28/19 1622 05/28/19 1933 05/29/19 0351 05/29/19 0809  BP:  113/64 (!) 91/52 90/62  Pulse:  65 65 63  Resp:    16  Temp:  98.2 F (36.8 C) 98.5 F (36.9 C) 98 F (36.7 C)  TempSrc:  Oral Oral Oral  SpO2: 97% 93% 95% 95%  Weight:      Height:        Wt Readings from Last 3 Encounters:  05/27/19 55.5 kg  04/28/19 49.9 kg  04/12/19 58.1 kg     Intake/Output Summary (Last 24 hours) at 05/29/2019 0923 Last data filed at 05/29/2019 0351 Gross per 24 hour  Intake 1727.46 ml  Output 101 ml  Net 1626.46 ml     Physical Exam  Gen: not in distress HEENT: moist mucosa, supple neck Chest: clear b/l, no added sounds CVS: N S1&S2, no murmurs,  GI: soft, NT, ND, Musculoskeletal: warm, no edema CNS: AAOX0    Data Review:    CBC Recent Labs  Lab 05/24/19 1922 05/25/19 0632 05/26/19 0456 05/27/19 0452 05/28/19 0623  WBC 10.2 7.7 6.7 6.2 5.4  HGB 11.3* 9.1* 8.8* 8.7* 8.5*  HCT 34.2* 27.4* 27.5* 27.0* 26.5*  PLT 182 156 144* 152 146*  MCV 87.7 87.5 89.6 88.8 90.1  MCH 29.0 29.1 28.7 28.6 28.9  MCHC 33.0 33.2 32.0 32.2 32.1  RDW 13.7 13.7 14.0 13.8 13.8  LYMPHSABS  --   --   --  1.5  --   MONOABS  --   --   --  0.7  --   EOSABS  --   --   --  0.2  --   BASOSABS  --   --   --  0.1  --     Chemistries  Recent Labs  Lab 05/24/19 1922 05/24/19 2210 05/25/19 0632 05/26/19 0456 05/27/19 0452 05/28/19 0623  NA 140  --  142 143 142 144  K 2.8*  --  3.5 4.2 3.7 3.7  CL 100  --  107 112* 110 113*  CO2  29  --  29 24 27 29   GLUCOSE 175*  --  111* 103* 105* 88  BUN 17  --  15 15 13 11   CREATININE 1.32*  --  1.16* 1.13* 1.07* 1.08*  CALCIUM 8.5*  --  7.9* 8.2* 8.2* 8.0*  MG  --  2.1  --  2.1  --  1.9   ------------------------------------------------------------------------------------------------------------------ No results for input(s): CHOL, HDL, LDLCALC, TRIG, CHOLHDL, LDLDIRECT in the last 72 hours.  Lab Results  Component Value Date   HGBA1C 5.9 (H) 04/12/2019   ------------------------------------------------------------------------------------------------------------------ No results for input(s): TSH, T4TOTAL, T3FREE, THYROIDAB in the last 72 hours.  Invalid input(s): FREET3 ------------------------------------------------------------------------------------------------------------------ No results for input(s): VITAMINB12, FOLATE, FERRITIN, TIBC, IRON, RETICCTPCT in the last 72 hours.  Coagulation profile Recent Labs  Lab 05/24/19 2210  INR 1.1    No results for input(s): DDIMER in the last 72 hours.  Cardiac Enzymes No results for input(s): CKMB, TROPONINI, MYOGLOBIN in the last 168 hours.  Invalid input(s): CK ------------------------------------------------------------------------------------------------------------------    Component Value Date/Time   BNP 131.0 (H) 05/24/2019 2210    Inpatient Medications  Scheduled Meds: . citalopram  10 mg  Oral Daily  . feeding supplement (ENSURE ENLIVE)  237 mL Oral BID BM  . insulin aspart  0-9 Units Subcutaneous TID WC  . mouth rinse  15 mL Mouth Rinse BID  . multivitamin with minerals  1 tablet Oral Daily  . pravastatin  20 mg Oral q1800   Continuous Infusions: PRN Meds:.ondansetron (ZOFRAN) IV  Micro Results Recent Results (from the past 240 hour(s))  Respiratory Panel by RT PCR (Flu A&B, Covid) - Nasopharyngeal Swab     Status: None   Collection Time: 05/24/19 10:36 PM   Specimen: Nasopharyngeal Swab   Result Value Ref Range Status   SARS Coronavirus 2 by RT PCR NEGATIVE NEGATIVE Final    Comment: (NOTE) SARS-CoV-2 target nucleic acids are NOT DETECTED. The SARS-CoV-2 RNA is generally detectable in upper respiratoy specimens during the acute phase of infection. The lowest concentration of SARS-CoV-2 viral copies this assay can detect is 131 copies/mL. A negative result does not preclude SARS-Cov-2 infection and should not be used as the sole basis for treatment or other patient management decisions. A negative result may occur with  improper specimen collection/handling, submission of specimen other than nasopharyngeal swab, presence of viral mutation(s) within the areas targeted by this assay, and inadequate number of viral copies (<131 copies/mL). A negative result must be combined with clinical observations, patient history, and epidemiological information. The expected result is Negative. Fact Sheet for Patients:  https://www.moore.com/ Fact Sheet for Healthcare Providers:  https://www.young.biz/ This test is not yet ap proved or cleared by the [place] FDA and  has been authorized for detection and/or diagnosis of SARS-CoV-2 by FDA under an Emergency Use Authorization (EUA). This EUA will remain  in effect (meaning this test can be used) for the duration of the COVID-19 declaration under Section 564(b)(1) of the Act, 21 U.S.C. section 360bbb-3(b)(1), unless the authorization is terminated or revoked sooner.    Influenza A by PCR NEGATIVE NEGATIVE Final   Influenza B by PCR NEGATIVE NEGATIVE Final    Comment: (NOTE) The Xpert Xpress SARS-CoV-2/FLU/RSV assay is intended as an aid in  the diagnosis of influenza from Nasopharyngeal swab specimens and  should not be used as a sole basis for treatment. Nasal washings and  aspirates are unacceptable for Xpert Xpress SARS-CoV-2/FLU/RSV  testing. Fact Sheet for  Patients: https://www.moore.com/ Fact Sheet for Healthcare Providers: https://www.young.biz/ This test is not yet approved or cleared by the [place] FDA and  has been authorized for detection and/or diagnosis of SARS-CoV-2 by  FDA under an Emergency Use Authorization (EUA). This EUA will remain  in effect (meaning this test can be used) for the duration of the  Covid-19 declaration under Section 564(b)(1) of the Act, 21  U.S.C. section 360bbb-3(b)(1), unless the authorization is  terminated or revoked. Performed at [facility], [address] [place], [place] [address]   MRSA PCR Screening     Status: None   Collection Time: 05/25/19 12:13 AM   Specimen: Nasopharyngeal  Result Value Ref Range Status   MRSA by PCR NEGATIVE NEGATIVE Final    Comment:        The GeneXpert MRSA Assay (FDA approved for NASAL specimens only), is one component of a comprehensive MRSA colonization surveillance program. It is not intended to diagnose MRSA infection nor to guide or monitor treatment for MRSA infections. Performed at [facility], [address] [place], [place] [address]   SARS CORONAVIRUS 2 (TAT 6-24 HRS) Nasopharyngeal Nasopharyngeal Swab  Status: None   Collection Time: 05/28/19  4:07 PM   Specimen: Nasopharyngeal Swab  Result Value Ref Range Status   SARS Coronavirus 2 NEGATIVE NEGATIVE Final    Comment: (NOTE) SARS-CoV-2 target nucleic acids are NOT DETECTED. The SARS-CoV-2 RNA is generally detectable in upper and lower respiratory specimens during the acute phase of infection. Negative results do not preclude SARS-CoV-2 infection, do not rule out co-infections with other pathogens, and should not be used as the sole basis for treatment or other patient management decisions. Negative results must be combined with clinical observations, patient history, and epidemiological information. The expected result is  Negative. Fact Sheet for Patients: HairSlick.no Fact Sheet for Healthcare Providers: quierodirigir.com This test is not yet approved or cleared by the [place] FDA and  has been authorized for detection and/or diagnosis of SARS-CoV-2 by FDA under an Emergency Use Authorization (EUA). This EUA will remain  in effect (meaning this test can be used) for the duration of the COVID-19 declaration under Section 56 4(b)(1) of the Act, 21 U.S.C. section 360bbb-3(b)(1), unless the authorization is terminated or revoked sooner. Performed at [facility], [address] [place], [place] [address]     Radiology Reports DG Chest 1 View  Result Date: 05/24/2019 CLINICAL DATA:  Syncope EXAM: CHEST  1 VIEW COMPARISON:  CT same day, April 12, 2019 FINDINGS: The heart size and mediastinal contours are within normal limits. Aortic knob calcifications. Both lungs are clear. The visualized skeletal structures are unremarkable. IMPRESSION: No active disease. Electronically Signed   By: Jonna Clark M.D.   On: 05/24/2019 22:22   CT Angio Chest PE W/Cm &/Or Wo Cm  Result Date: 05/24/2019 CLINICAL DATA:  Hypoxia, syncope EXAM: CT ANGIOGRAPHY CHEST WITH CONTRAST TECHNIQUE: Multidetector CT imaging of the chest was performed using the standard protocol during bolus administration of intravenous contrast. Multiplanar CT image reconstructions and MIPs were obtained to evaluate the vascular anatomy. CONTRAST:  50mL OMNIPAQUE IOHEXOL 350 MG/ML SOLN COMPARISON:  04/12/2019 FINDINGS: Cardiovascular: This is a technically adequate evaluation of the pulmonary vasculature. Diffuse bilateral pulmonary emboli are seen within all lobar distributions, as well as a large embolus persisting in the right main pulmonary artery. Significant clot burden. Dilated right ventricle with RV/LV ratio measuring 1.6. The heart is not enlarged. Significant atherosclerosis of the  thoracic aorta. Aberrant origin of the right subclavian artery is noted, a frequent anatomic variant. No evidence of thoracic aortic aneurysm or dissection. Mediastinum/Nodes: No enlarged mediastinal, hilar, or axillary lymph nodes. Thyroid gland, trachea, and esophagus demonstrate no significant findings. Lungs/Pleura: Mild upper lobe predominant emphysema. No airspace disease, effusion, or pneumothorax. The central airways are patent. Upper Abdomen: No acute abnormality. Musculoskeletal: No acute or destructive bony lesions. Reconstructed images demonstrate no additional findings. Review of the MIP images confirms the above findings. IMPRESSION: 1. Diffuse bilateral pulmonary emboli with CTevidence of right heart strain (RV/LV Ratio = 1.6) consistent with at least submassive (intermediate risk) PE. The presence of right heart strain has been associated with an increased risk of morbidity and mortality. 2.  Aortic Atherosclerosis (ICD10-I70.0). 3. Emphysema (ICD10-J43.9). These results were called by telephone at the time of interpretation on 05/24/2019 at 9:56 pm to provider [facility] , who verbally acknowledged these results. Electronically Signed   By: Sharlet Salina M.D.   On: 05/24/2019 21:56   PERIPHERAL VASCULAR CATHETERIZATION  Result Date: 05/27/2019 See op note   Time Spent in minutes  35   Juli Odom M.D  on 05/29/2019 at 9:23 AM  Between 7am to 7pm - Pager - [phone]  After 7pm go to [email or web address] - password [facility]  [facility] -  Office  [phone]

## 2019-06-13 ENCOUNTER — Observation Stay: Payer: Medicare PPO

## 2019-06-13 ENCOUNTER — Other Ambulatory Visit: Payer: Self-pay

## 2019-06-13 ENCOUNTER — Inpatient Hospital Stay
Admission: EM | Admit: 2019-06-13 | Discharge: 2019-06-15 | DRG: 689 | Disposition: A | Discharge status: Home / Self Care | Payer: Medicare PPO | Source: Skilled Nursing Facility | Attending: Internal Medicine | Admitting: Internal Medicine

## 2019-06-13 ENCOUNTER — Emergency Department: Payer: Medicare PPO

## 2019-06-13 DIAGNOSIS — M810 Age-related osteoporosis without current pathological fracture: Secondary | ICD-10-CM | POA: Diagnosis present

## 2019-06-13 DIAGNOSIS — Z95828 Presence of other vascular implants and grafts: Secondary | ICD-10-CM

## 2019-06-13 DIAGNOSIS — I959 Hypotension, unspecified: Secondary | ICD-10-CM

## 2019-06-13 DIAGNOSIS — D500 Iron deficiency anemia secondary to blood loss (chronic): Secondary | ICD-10-CM | POA: Diagnosis present

## 2019-06-13 DIAGNOSIS — Z888 Allergy status to other drugs, medicaments and biological substances status: Secondary | ICD-10-CM

## 2019-06-13 DIAGNOSIS — Z86711 Personal history of pulmonary embolism: Secondary | ICD-10-CM

## 2019-06-13 DIAGNOSIS — R600 Localized edema: Secondary | ICD-10-CM

## 2019-06-13 DIAGNOSIS — G9341 Metabolic encephalopathy: Secondary | ICD-10-CM | POA: Diagnosis present

## 2019-06-13 DIAGNOSIS — N3090 Cystitis, unspecified without hematuria: Secondary | ICD-10-CM

## 2019-06-13 DIAGNOSIS — R911 Solitary pulmonary nodule: Secondary | ICD-10-CM | POA: Diagnosis present

## 2019-06-13 DIAGNOSIS — I82413 Acute embolism and thrombosis of femoral vein, bilateral: Secondary | ICD-10-CM | POA: Diagnosis present

## 2019-06-13 DIAGNOSIS — N183 Chronic kidney disease, stage 3 unspecified: Secondary | ICD-10-CM

## 2019-06-13 DIAGNOSIS — D649 Anemia, unspecified: Secondary | ICD-10-CM

## 2019-06-13 DIAGNOSIS — Z9181 History of falling: Secondary | ICD-10-CM

## 2019-06-13 DIAGNOSIS — Z794 Long term (current) use of insulin: Secondary | ICD-10-CM

## 2019-06-13 DIAGNOSIS — Z87891 Personal history of nicotine dependence: Secondary | ICD-10-CM

## 2019-06-13 DIAGNOSIS — E1122 Type 2 diabetes mellitus with diabetic chronic kidney disease: Secondary | ICD-10-CM | POA: Diagnosis present

## 2019-06-13 DIAGNOSIS — N390 Urinary tract infection, site not specified: Secondary | ICD-10-CM | POA: Diagnosis present

## 2019-06-13 DIAGNOSIS — Z79899 Other long term (current) drug therapy: Secondary | ICD-10-CM

## 2019-06-13 DIAGNOSIS — N1831 Chronic kidney disease, stage 3a: Secondary | ICD-10-CM | POA: Diagnosis present

## 2019-06-13 DIAGNOSIS — Z87440 Personal history of urinary (tract) infections: Secondary | ICD-10-CM

## 2019-06-13 DIAGNOSIS — F0150 Vascular dementia without behavioral disturbance: Secondary | ICD-10-CM | POA: Diagnosis present

## 2019-06-13 DIAGNOSIS — G9340 Encephalopathy, unspecified: Secondary | ICD-10-CM | POA: Diagnosis present

## 2019-06-13 DIAGNOSIS — E860 Dehydration: Secondary | ICD-10-CM | POA: Diagnosis present

## 2019-06-13 DIAGNOSIS — I82409 Acute embolism and thrombosis of unspecified deep veins of unspecified lower extremity: Secondary | ICD-10-CM

## 2019-06-13 DIAGNOSIS — Z20822 Contact with and (suspected) exposure to covid-19: Secondary | ICD-10-CM | POA: Diagnosis present

## 2019-06-13 DIAGNOSIS — F0390 Unspecified dementia without behavioral disturbance: Secondary | ICD-10-CM | POA: Diagnosis present

## 2019-06-13 DIAGNOSIS — I5032 Chronic diastolic (congestive) heart failure: Secondary | ICD-10-CM | POA: Diagnosis present

## 2019-06-13 DIAGNOSIS — Z7983 Long term (current) use of bisphosphonates: Secondary | ICD-10-CM

## 2019-06-13 DIAGNOSIS — I82433 Acute embolism and thrombosis of popliteal vein, bilateral: Secondary | ICD-10-CM | POA: Diagnosis present

## 2019-06-13 DIAGNOSIS — I824Z2 Acute embolism and thrombosis of unspecified deep veins of left distal lower extremity: Secondary | ICD-10-CM | POA: Diagnosis present

## 2019-06-13 DIAGNOSIS — N179 Acute kidney failure, unspecified: Secondary | ICD-10-CM

## 2019-06-13 DIAGNOSIS — E119 Type 2 diabetes mellitus without complications: Secondary | ICD-10-CM

## 2019-06-13 LAB — LACTIC ACID, PLASMA: Lactic Acid, Venous: 0.8 mmol/L (ref 0.5–1.9)

## 2019-06-13 LAB — URINALYSIS, ROUTINE W REFLEX MICROSCOPIC
Bilirubin Urine: NEGATIVE
Glucose, UA: NEGATIVE mg/dL
Hgb urine dipstick: NEGATIVE
Ketones, ur: NEGATIVE mg/dL
Nitrite: POSITIVE — AB
Protein, ur: NEGATIVE mg/dL
Specific Gravity, Urine: 1.019 (ref 1.005–1.030)
pH: 5 (ref 5.0–8.0)

## 2019-06-13 LAB — CBC WITH DIFFERENTIAL/PLATELET
Abs Immature Granulocytes: 0.03 10*3/uL (ref 0.00–0.07)
Basophils Absolute: 0 10*3/uL (ref 0.0–0.1)
Basophils Relative: 1 %
Eosinophils Absolute: 0.1 10*3/uL (ref 0.0–0.5)
Eosinophils Relative: 2 %
HCT: 24 % — ABNORMAL LOW (ref 36.0–46.0)
Hemoglobin: 7.9 g/dL — ABNORMAL LOW (ref 12.0–15.0)
Immature Granulocytes: 1 %
Lymphocytes Relative: 19 %
Lymphs Abs: 1.2 10*3/uL (ref 0.7–4.0)
MCH: 28.9 pg (ref 26.0–34.0)
MCHC: 32.9 g/dL (ref 30.0–36.0)
MCV: 87.9 fL (ref 80.0–100.0)
Monocytes Absolute: 0.8 10*3/uL (ref 0.1–1.0)
Monocytes Relative: 13 %
Neutro Abs: 4 10*3/uL (ref 1.7–7.7)
Neutrophils Relative %: 64 %
Platelets: 145 10*3/uL — ABNORMAL LOW (ref 150–400)
RBC: 2.73 MIL/uL — ABNORMAL LOW (ref 3.87–5.11)
RDW: 13.6 % (ref 11.5–15.5)
WBC: 6.3 10*3/uL (ref 4.0–10.5)
nRBC: 0 % (ref 0.0–0.2)

## 2019-06-13 LAB — TSH: TSH: 1.198 u[IU]/mL (ref 0.350–4.500)

## 2019-06-13 LAB — CBC
HCT: 23.2 % — ABNORMAL LOW (ref 36.0–46.0)
Hemoglobin: 7.4 g/dL — ABNORMAL LOW (ref 12.0–15.0)
MCH: 28.6 pg (ref 26.0–34.0)
MCHC: 31.9 g/dL (ref 30.0–36.0)
MCV: 89.6 fL (ref 80.0–100.0)
Platelets: 146 10*3/uL — ABNORMAL LOW (ref 150–400)
RBC: 2.59 MIL/uL — ABNORMAL LOW (ref 3.87–5.11)
RDW: 13.5 % (ref 11.5–15.5)
WBC: 6.1 10*3/uL (ref 4.0–10.5)
nRBC: 0 % (ref 0.0–0.2)

## 2019-06-13 LAB — BRAIN NATRIURETIC PEPTIDE: B Natriuretic Peptide: 151 pg/mL — ABNORMAL HIGH (ref 0.0–100.0)

## 2019-06-13 LAB — PROTIME-INR
INR: 1.4 — ABNORMAL HIGH (ref 0.8–1.2)
Prothrombin Time: 16.6 seconds — ABNORMAL HIGH (ref 11.4–15.2)

## 2019-06-13 LAB — COMPREHENSIVE METABOLIC PANEL
ALT: 9 U/L (ref 0–44)
AST: 14 U/L — ABNORMAL LOW (ref 15–41)
Albumin: 3.3 g/dL — ABNORMAL LOW (ref 3.5–5.0)
Alkaline Phosphatase: 51 U/L (ref 38–126)
Anion gap: 10 (ref 5–15)
BUN: 18 mg/dL (ref 8–23)
CO2: 27 mmol/L (ref 22–32)
Calcium: 8.6 mg/dL — ABNORMAL LOW (ref 8.9–10.3)
Chloride: 100 mmol/L (ref 98–111)
Creatinine, Ser: 1.39 mg/dL — ABNORMAL HIGH (ref 0.44–1.00)
GFR calc Af Amer: 40 mL/min — ABNORMAL LOW (ref >60)
GFR calc non Af Amer: 34 mL/min — ABNORMAL LOW (ref >60)
Glucose, Bld: 119 mg/dL — ABNORMAL HIGH (ref 70–99)
Potassium: 3.4 mmol/L — ABNORMAL LOW (ref 3.5–5.1)
Sodium: 137 mmol/L (ref 135–145)
Total Bilirubin: 1.7 mg/dL — ABNORMAL HIGH (ref 0.3–1.2)
Total Protein: 6.4 g/dL — ABNORMAL LOW (ref 6.5–8.1)

## 2019-06-13 LAB — BASIC METABOLIC PANEL
Anion gap: 7 (ref 5–15)
BUN: 16 mg/dL (ref 8–23)
CO2: 27 mmol/L (ref 22–32)
Calcium: 7.7 mg/dL — ABNORMAL LOW (ref 8.9–10.3)
Chloride: 104 mmol/L (ref 98–111)
Creatinine, Ser: 1.29 mg/dL — ABNORMAL HIGH (ref 0.44–1.00)
GFR calc Af Amer: 44 mL/min — ABNORMAL LOW (ref >60)
GFR calc non Af Amer: 38 mL/min — ABNORMAL LOW (ref >60)
Glucose, Bld: 137 mg/dL — ABNORMAL HIGH (ref 70–99)
Potassium: 3.4 mmol/L — ABNORMAL LOW (ref 3.5–5.1)
Sodium: 138 mmol/L (ref 135–145)

## 2019-06-13 LAB — TROPONIN I (HIGH SENSITIVITY)
Troponin I (High Sensitivity): 24 ng/L — ABNORMAL HIGH
Troponin I (High Sensitivity): 24 ng/L — ABNORMAL HIGH (ref <18)

## 2019-06-13 LAB — T4, FREE: Free T4: 0.76 ng/dL (ref 0.61–1.12)

## 2019-06-13 LAB — GLUCOSE, CAPILLARY: Glucose-Capillary: 105 mg/dL — ABNORMAL HIGH (ref 70–99)

## 2019-06-13 LAB — APTT: aPTT: 39 seconds — ABNORMAL HIGH (ref 24–36)

## 2019-06-13 MED ORDER — SODIUM CHLORIDE 0.9 % IV SOLN
1.0000 g | INTRAVENOUS | Status: DC
  Administered 2019-06-14: 19:00:00 1 g via INTRAVENOUS
  Filled 2019-06-13: qty 1
  Filled 2019-06-13 (×2): qty 10

## 2019-06-13 MED ORDER — INSULIN ASPART 100 UNIT/ML ~~LOC~~ SOLN: 0.0000 [IU] | Freq: Three times a day (TID) | SUBCUTANEOUS | Status: DC

## 2019-06-13 MED ORDER — ADULT MULTIVITAMIN W/MINERALS CH
1.0000 | ORAL_TABLET | Freq: Every day | ORAL | Status: DC
  Administered 2019-06-13 – 2019-06-15 (×3): 1 via ORAL
  Filled 2019-06-13 (×3): qty 1

## 2019-06-13 MED ORDER — SODIUM CHLORIDE 0.9 % IV BOLUS
500.0000 mL | Freq: Once | INTRAVENOUS | Status: AC
  Administered 2019-06-13: 500 mL via INTRAVENOUS

## 2019-06-13 MED ORDER — SODIUM CHLORIDE 0.9 % IV BOLUS
500.0000 mL | Freq: Once | INTRAVENOUS | Status: AC
  Administered 2019-06-13: 21:00:00 500 mL via INTRAVENOUS

## 2019-06-13 MED ORDER — SALINE SPRAY 0.65 % NA SOLN
1.0000 | Freq: Three times a day (TID) | NASAL | Status: DC
  Administered 2019-06-13 – 2019-06-15 (×3): 1 via NASAL
  Filled 2019-06-13 (×2): qty 44

## 2019-06-13 MED ORDER — FUROSEMIDE 20 MG PO TABS
20.0000 mg | ORAL_TABLET | Freq: Every day | ORAL | Status: DC
  Administered 2019-06-14 – 2019-06-15 (×2): 20 mg via ORAL
  Filled 2019-06-13 (×2): qty 1

## 2019-06-13 MED ORDER — ALUM & MAG HYDROXIDE-SIMETH 200-200-20 MG/5ML PO SUSP: 30.0000 mL | Freq: Four times a day (QID) | ORAL | Status: DC | PRN

## 2019-06-13 MED ORDER — ENSURE ENLIVE PO LIQD
237.0000 mL | Freq: Two times a day (BID) | ORAL | Status: DC
  Administered 2019-06-15: 11:00:00 237 mL via ORAL

## 2019-06-13 MED ORDER — ONDANSETRON HCL 4 MG/2ML IJ SOLN: 4.0000 mg | Freq: Four times a day (QID) | INTRAMUSCULAR | Status: DC | PRN

## 2019-06-13 MED ORDER — LOPERAMIDE HCL 2 MG PO CAPS
2.0000 mg | ORAL_CAPSULE | Freq: Four times a day (QID) | ORAL | Status: DC | PRN
  Filled 2019-06-13: qty 1

## 2019-06-13 MED ORDER — CITALOPRAM HYDROBROMIDE 20 MG PO TABS
10.0000 mg | ORAL_TABLET | Freq: Every day | ORAL | Status: DC
  Administered 2019-06-14 – 2019-06-15 (×2): 10 mg via ORAL
  Filled 2019-06-13 (×2): qty 1

## 2019-06-13 MED ORDER — ACETAMINOPHEN 325 MG PO TABS: 650.0000 mg | ORAL_TABLET | Freq: Four times a day (QID) | ORAL | Status: DC | PRN

## 2019-06-13 MED ORDER — IOHEXOL 350 MG/ML SOLN
75.0000 mL | Freq: Once | INTRAVENOUS | Status: AC | PRN
  Administered 2019-06-13: 15:00:00 75 mL via INTRAVENOUS

## 2019-06-13 MED ORDER — ONDANSETRON HCL 4 MG PO TABS: 4.0000 mg | ORAL_TABLET | Freq: Four times a day (QID) | ORAL | Status: DC | PRN

## 2019-06-13 MED ORDER — CALCIUM CARBONATE-VITAMIN D 500-200 MG-UNIT PO TABS
1.0000 | ORAL_TABLET | Freq: Two times a day (BID) | ORAL | Status: DC
  Administered 2019-06-13 – 2019-06-15 (×2): 1 via ORAL
  Filled 2019-06-13 (×5): qty 1

## 2019-06-13 MED ORDER — ENOXAPARIN SODIUM 40 MG/0.4ML ~~LOC~~ SOLN
40.0000 mg | SUBCUTANEOUS | Status: DC
  Administered 2019-06-13 – 2019-06-14 (×2): 40 mg via SUBCUTANEOUS
  Filled 2019-06-13 (×2): qty 0.4

## 2019-06-13 MED ORDER — ACETAMINOPHEN 650 MG RE SUPP: 650.0000 mg | Freq: Four times a day (QID) | RECTAL | Status: DC | PRN

## 2019-06-13 MED ORDER — SODIUM CHLORIDE 0.9 % IV SOLN
1.0000 g | Freq: Once | INTRAVENOUS | Status: AC
  Administered 2019-06-13: 1 g via INTRAVENOUS
  Filled 2019-06-13: qty 10

## 2019-06-13 MED ORDER — PRAVASTATIN SODIUM 20 MG PO TABS
40.0000 mg | ORAL_TABLET | Freq: Every day | ORAL | Status: DC
  Administered 2019-06-14: 17:00:00 40 mg via ORAL
  Filled 2019-06-13: qty 1
  Filled 2019-06-13: qty 2

## 2019-06-13 MED ORDER — GUAIFENESIN 100 MG/5ML PO SYRP
200.0000 mg | ORAL_SOLUTION | Freq: Four times a day (QID) | ORAL | Status: DC | PRN
  Filled 2019-06-13: qty 10

## 2019-06-13 MED ORDER — MAGNESIUM HYDROXIDE 400 MG/5ML PO SUSP: 30.0000 mL | Freq: Every evening | ORAL | Status: DC | PRN

## 2019-06-13 MED ORDER — SODIUM CHLORIDE 0.9 % IV SOLN: INTRAVENOUS | Status: DC

## 2019-06-13 NOTE — ED Triage Notes (Addendum)
Pt in via EMS from [facility] assisted living d/t episodes of near syncope. Afib with EMS. 20g IV R ac by EMS. 325mg  ASA oral from EMS. A&Ox4 with EMS with history of dementia. 144 BG with EMS. History of DVT and edema in legs. Facility reports a few missed doses of pt's lasix per EMS. Pt reports chest discomfort per EMS. Pt has warm blanket. Pt currently alert and calm. Skin dry. Pt has non-adhesive dressing to L fa from a fall she states that she had yesterday.

## 2019-06-13 NOTE — ED Provider Notes (Signed)
[facility] Emergency Department Provider Note  ____________________________________________   First MD Initiated Contact with Patient 06/13/19 1313     (approximate)  I have reviewed the triage vital signs and the nursing notes.   HISTORY  Chief Complaint Near Syncope    HPI Jasmine Hubbard is a 86 y.o. female with CHF, diabetes, known PEs who presents for less responsiveness.  On review of patient's discharge summary from 3/2 patient was diagnosed with a large PE but when placed on heparin she had a GI bleed.  Patient had IVC filter placed and patient clinically improved and was discharged off anticoagulation due to the GI bleed as well as the history of recurrent falls.  Patient now comes in for concern for less responsiveness this morning.  Patient herself is oriented x2 and is not able to provide a history of what happened due to her baseline dementia.  I called the facility and talk to the nurse who stated that she was not as responsive this morning they were concerned because they thought the oxygen saturations were reading 78%.  They also noted that her legs were more swollen however she has not been on her Lasix for the past week or so.  I asked about the skin tear on the right arm they stated that she bumped into something on the 12th.  They denied knowing of any other falls.  Patient does report having a headache.          Past Medical History:  Diagnosis Date  . CHF (congestive heart failure) (HCC)   . Diabetes mellitus without complication (HCC)   . Left ventricular failure (HCC)   . Osteoporosis   . Pulmonary embolism (HCC)   . Pulmonary embolus (HCC)   . Vascular dementia [facility])     Patient Active Problem List   Diagnosis Date Noted  . Pulmonary embolism (HCC) 05/24/2019  . Hypoxia 05/24/2019  . Hypokalemia 05/24/2019  . CKD (chronic kidney disease) 05/24/2019  . Dementia (HCC) 04/12/2019  . Acute metabolic encephalopathy  04/12/2019  . Diabetes (HCC) 04/12/2019  . Chronic diastolic CHF (congestive heart failure) (HCC) 04/12/2019  . UTI (urinary tract infection) 04/12/2019    Past Surgical History:  Procedure Laterality Date  . ABDOMINAL HYSTERECTOMY    . BACK SURGERY    . FRACTURE SURGERY     elbow  . IVC FILTER INSERTION N/A 05/27/2019   Procedure: IVC FILTER INSERTION;  Surgeon: Annice Needy, MD;  Location: [facility] INVASIVE CV LAB;  Service: Cardiovascular;  Laterality: N/A;    Prior to Admission medications   Medication Sig Start Date End Date Taking? Authorizing Provider  acetaminophen (TYLENOL) 325 MG tablet Take 650 mg by mouth every 6 (six) hours as needed for mild pain.    [provider]  alendronate (FOSAMAX) 70 MG tablet Take 70 mg by mouth every Friday. Take with a full glass of water on an empty stomach.     [provider]  alum & mag hydroxide-simeth (MINTOX) 200-200-20 MG/5ML suspension Take 30 mLs by mouth every 6 (six) hours as needed for indigestion or heartburn.    [provider]  Calcium Carb-Cholecalciferol 600-400 MG-UNIT TABS Take 1 tablet by mouth 2 (two) times a day.  04/24/19 05/24/19  [provider]  Calcium Citrate-Vitamin D 250-200 MG-UNIT TABS Take 2 tablets by mouth at bedtime.     [provider]  citalopram (CELEXA) 10 MG tablet Take 10 mg by mouth daily.  [provider]  feeding supplement, ENSURE ENLIVE, (ENSURE ENLIVE) LIQD Take 237 mLs by mouth 2 (two) times daily between meals. 05/29/19   Pennie Banter, DO  furosemide (LASIX) 20 MG tablet Take 20 mg by mouth daily.     [provider]  guaifenesin (ROBITUSSIN) 100 MG/5ML syrup Take 200 mg by mouth every 6 (six) hours as needed for cough or congestion. (max 4 doses daily)    [provider]  insulin aspart (NOVOLOG) 100 UNIT/ML injection Inject 0-9 Units into the skin 3 (three) times daily with meals. Patient not taking: Reported on 05/24/2019  04/16/19   Thomasenia Bottoms, MD  insulin aspart (NOVOLOG) 100 UNIT/ML injection Inject 0-5 Units into the skin at bedtime. Patient not taking: Reported on 05/24/2019 04/16/19   Thomasenia Bottoms, MD  loperamide (IMODIUM A-D) 2 MG tablet Take 1 tablet (2 mg total) by mouth 4 (four) times daily as needed for diarrhea or loose stools. 05/29/19   Dhungel, Nishant, MD  lovastatin (MEVACOR) 20 MG tablet Take 40 mg by mouth at bedtime.    [provider]  magnesium hydroxide (MILK OF MAGNESIA) 400 MG/5ML suspension Take 30 mLs by mouth daily as needed for mild constipation.    [provider]  Multiple Vitamin (MULTIVITAMIN WITH MINERALS) TABS tablet Take 1 tablet by mouth daily. 05/29/19   Pennie Banter, DO  neomycin-bacitracin-polymyxin (NEOSPORIN) OINT Apply 1 application topically 4 (four) times daily as needed for wound care.    [provider]  Saccharomyces boulardii (PROBIOTIC) 250 MG CAPS Take 1 each by mouth 2 (two) times daily. 04/16/19   Thomasenia Bottoms, MD  sodium chloride (OCEAN) 0.65 % SOLN nasal spray Place 1 spray into both nostrils 3 (three) times daily.     [provider]    Allergies Naproxen  No family history on file.  Social History Social History   Tobacco Use  . Smoking status: Former Smoker    Types: Cigarettes    Quit date: 08/28/1999    Years since quitting: 19.8  . Smokeless tobacco: Never Used  Substance Use Topics  . Alcohol use: Not Currently  . Drug use: Never      Review of Systems Constitutional: No fever/chills Eyes: No visual changes. ENT: No sore throat. Cardiovascular: Denies chest pain. Respiratory: Denies shortness of breath. Gastrointestinal: No abdominal pain.  No nausea, no vomiting.  No diarrhea.  No constipation. Genitourinary: Negative for dysuria. Musculoskeletal: Negative for back pain. Skin: Negative for rash. Neurological: Positive headache, focal weakness or numbness. All other ROS negative although  history is limited due to patient's baseline dementia ____________________________________________   PHYSICAL EXAM:  VITAL SIGNS: ED Triage Vitals  Enc Vitals Group     BP 06/13/19 1311 (!) 97/57     Pulse Rate 06/13/19 1311 69     Resp 06/13/19 1311 (!) 21     Temp 06/13/19 1316 (!) 97.5 F (36.4 C)     Temp Source 06/13/19 1316 Axillary     SpO2 06/13/19 1311 95 %     Weight 06/13/19 1320 125 lb 10.6 oz (57 kg)     Height 06/13/19 1320 5\' 4"  (1.626 m)     Head Circumference --      Peak Flow --      Pain Score 06/13/19 1318 10     Pain Loc --      Pain Edu? --      Excl. in GC? --     Constitutional: Alert  and oriented x2. Well appearing and in no acute distress but occasionally does doze off. Eyes: Conjunctivae are normal. EOMI. Head: Atraumatic. Nose: No congestion/rhinnorhea. Mouth/Throat: Mucous membranes are moist.   Neck: No stridor. Trachea Midline. FROM Cardiovascular: Normal rate, regular rhythm. Grossly normal heart sounds.  Good peripheral circulation. Respiratory: Normal respiratory effort.  No retractions. Lungs CTAB. Gastrointestinal: Soft and nontender. No distention. No abdominal bruits.  Musculoskeletal: Edema bilaterally.  No joint effusions.  Skin tear on the left lower arm that appears new. Neurologic:  Normal speech and language. No gross focal neurologic deficits are appreciated.  Patient has good arm squeeze in her bilateral hands.  Able to wiggle toes Skin:  Skin is warm, dry and intact. No rash noted. Psychiatric: Mood and affect are normal. Speech and behavior are normal. GU: Deferred   ____________________________________________   LABS (all labs ordered are listed, but only abnormal results are displayed)  Labs Reviewed  CBC WITH DIFFERENTIAL/PLATELET - Abnormal; Notable for the following components:      Result Value   RBC 2.73 (*)    Hemoglobin 7.9 (*)    HCT 24.0 (*)    Platelets 145 (*)    All other components within normal limits   COMPREHENSIVE METABOLIC PANEL - Abnormal; Notable for the following components:   Potassium 3.4 (*)    Glucose, Bld 119 (*)    Creatinine, Ser 1.39 (*)    Calcium 8.6 (*)    Total Protein 6.4 (*)    Albumin 3.3 (*)    AST 14 (*)    Total Bilirubin 1.7 (*)    GFR calc non Af Amer 34 (*)    GFR calc Af Amer 40 (*)    All other components within normal limits  URINALYSIS, ROUTINE W REFLEX MICROSCOPIC - Abnormal; Notable for the following components:   Color, Urine YELLOW (*)    APPearance CLEAR (*)    Nitrite POSITIVE (*)    Leukocytes,Ua MODERATE (*)    Bacteria, UA MANY (*)    All other components within normal limits  PROTIME-INR - Abnormal; Notable for the following components:   Prothrombin Time 16.6 (*)    INR 1.4 (*)    All other components within normal limits  APTT - Abnormal; Notable for the following components:   aPTT 39 (*)    All other components within normal limits  BRAIN NATRIURETIC PEPTIDE - Abnormal; Notable for the following components:   B Natriuretic Peptide 151.0 (*)    All other components within normal limits  GLUCOSE, CAPILLARY - Abnormal; Notable for the following components:   Glucose-Capillary 105 (*)    All other components within normal limits  TROPONIN I (HIGH SENSITIVITY) - Abnormal; Notable for the following components:   Troponin I (High Sensitivity) 24 (*)    All other components within normal limits  TROPONIN I (HIGH SENSITIVITY) - Abnormal; Notable for the following components:   Troponin I (High Sensitivity) 24 (*)    All other components within normal limits  URINE CULTURE  SARS CORONAVIRUS 2 (TAT 6-24 HRS)  TSH  T4, FREE  LACTIC ACID, PLASMA  CBG MONITORING, ED  TYPE AND SCREEN   ____________________________________________   ED ECG REPORT I, Vanessa Montebello, the attending physician, personally viewed and interpreted this ECG.  EKG is sinus rate of 71, no ST elevation but does have T wave inversion in V3 V2 V4, occasional PAC.   These T wave inversions do appear new ____________________________________________  RADIOLOGY Martie Lee  E Charlton Boule, personally viewed and evaluated these images (plain radiographs) as part of my medical decision making, as well as reviewing the written report by the radiologist.  ED MD interpretation: No fracture noted  Official radiology report(s): DG Forearm Left  Result Date: 06/13/2019 CLINICAL DATA:  Left forearm pain. EXAM: LEFT FOREARM - 2 VIEW COMPARISON:  None. FINDINGS: There is no evidence of fracture or other focal bone lesions. Wrist and elbow alignment are maintained. There are vascular calcifications. Soft tissues are unremarkable. IMPRESSION: Negative radiographs of the left forearm. Electronically Signed   By: Narda Rutherford M.D.   On: 06/13/2019 13:55    ____________________________________________   PROCEDURES  Procedure(s) performed (including Critical Care):  Procedures   ____________________________________________   INITIAL IMPRESSION / ASSESSMENT AND PLAN / ED COURSE  Jasmine Hubbard was evaluated in Emergency Department on 06/13/2019 for the symptoms described in the history of present illness. She was evaluated in the context of the global COVID-19 pandemic, which necessitated consideration that the patient might be at risk for infection with the SARS-CoV-2 virus that causes COVID-19. Institutional protocols and algorithms that pertain to the evaluation of patients at risk for COVID-19 are in a state of rapid change based on information released by regulatory bodies including the CDC and federal and state organizations. These policies and algorithms were followed during the patient's care in the ED.    Patient is an 86 year old who comes in from [facility] for less responsiveness.  Patient was reported to have a skin tear from a few days ago although the skin tear does look new so think it be best to do a CT head to make sure no evidence intracranial  hemorrhage and CT cervical neck shows no evidence of cervical fracture.  Given patient's history of PE and report of episodes of LOC repeat the CT PE to make sure there is no worsening clot burden that would be amenable to thrombectomy although it appears that last CT scan was not.  Patient's not a candidate for anticoagulation due to for GI bleeding.  Will get labs to evaluate for electrolyte abnormalities, dehydration.  Patient is noted be slightly hypotensive.  We will give 500 cc of fluid given patient's extremities feel warm I do not think this is cardiogenic shock.  Hemoglobin is 7.9 which is downtrending from 8.5  Rectal exam was brown stool Hemoccult negative.  Patient given another 500 cc of fluid and blood pressure is getting better.  Patient does look little dry with a creatinine of 1.39 up from her baseline.  Her CT imaging did not show any signs of pulmonary edema and actually showed improvement of her known PEs.  However there was concern for some cystitis on the CT abdomen.  UA is consistent with UTI.  Given concern for UTI in the setting of low blood pressures and intermittent episodes of near syncope will discuss with the hospital team for admission.  Discussed with the patient's daughter and patient is full code       ____________________________________________   FINAL CLINICAL IMPRESSION(S) / ED DIAGNOSES   Final diagnoses:  AKI (acute kidney injury) (HCC)  Cystitis  Hypotensive episode      MEDICATIONS GIVEN DURING THIS VISIT:  Medications  sodium chloride 0.9 % bolus 500 mL (0 mLs Intravenous Stopped 06/13/19 1500)  iohexol (OMNIPAQUE) 350 MG/ML injection 75 mL (75 mLs Intravenous Contrast Given 06/13/19 1502)  sodium chloride 0.9 % bolus 500 mL (0 mLs Intravenous Stopped 06/13/19 1709)  cefTRIAXone (ROCEPHIN) 1 g in sodium chloride 0.9 % 100 mL IVPB (1 g Intravenous New Bag/Given 06/13/19 1823)     ED Discharge Orders    None       Note:  This  document was prepared using Dragon voice recognition software and may include unintentional dictation errors.   Concha SeFunke, Leyana Whidden E, MD 06/13/19 1910

## 2019-06-13 NOTE — ED Notes (Addendum)
This RN and Electrical engineer completed I&O cath. Cleaned pt up multiple times as she kept having loose BMs. Briefs changed and bed pads changed again. Pt repositioned. Bed locked low. Rails up. Call bell within reach. Stool sample sent to lab.

## 2019-06-13 NOTE — H&P (Signed)
History and Physical    Jasmine Hubbard NUU:[id number] DOB: Aug 06, 1931 DOA: 06/13/2019  PCP: [name], MD   Patient coming from: SNF I have personally briefly reviewed patient's old medical records in [facility]  Chief Complaint: decreased responsiveness  HPI: Jasmine Hubbard is a 86 y.o. female with medical history significant for dementia, diabetes, CKD 3A, chronic diarrhea, diastolic heart failure, recently hospitalized from 05/24/2019 to 05/29/2019 with diffuse bilateral PE after presenting with presyncope, developing GI bleed with heparin with subsequent placement of IVC filter, who presents to the emergency room with decreased responsiveness.  EMS reportedly got a O2 sat of 78% on room air.  Of note, patient also has a remote history of PE in 2018 and was on anticoagulation until February of this year which was discontinued because of high fall risk.  On the present occasion, nursing home reported that patient was less interactive than usual, falling asleep easily.  No falls reported.  History of of course limited as patient has dementia and is a bit somnolent. ED Course: On arrival in the emergency room she was noted to be hypotensive at 90/48, HR 69, RR 22, O2 sat 99% on 2 L.  She was afebrile.  EKG showed no acute ST-T wave changes.  Troponin was 24>>24, and BNP 151.  Notable abnormalities were hemoglobin of 7.9,(down from 8.5-9.1), creatinine of 1.39, up from 1.08.  WBC was normal at 6300.  Urinalysis was consistent with UTI with positive nitrites and large leukocyte esterase.  Lactic acid 0.8, .  Thyroid function normal.  She had extensive imaging studies including CT abdomen and pelvis, CTA chest as well as CT head and neck.  Significant findings included residual nonocclusive thrombus of main right pulmonary artery with improved PE burden, bladder wall thickening possible cystitis, with an additional finding of suspicion for right common femoral and right  superficial DVT.  Patient was given a normal saline bolus.  Started on Rocephin.  Hospitalist consulted for admission. Review of Systems: Unreliable due to dementia  Past Medical History:  Diagnosis Date  . CHF (congestive heart failure) (HCC)   . Diabetes mellitus without complication (HCC)   . Left ventricular failure (HCC)   . Osteoporosis   . Pulmonary embolism (HCC)   . Pulmonary embolus (HCC)   . Vascular dementia [facility])     Past Surgical History:  Procedure Laterality Date  . ABDOMINAL HYSTERECTOMY    . BACK SURGERY    . FRACTURE SURGERY     elbow  . IVC FILTER INSERTION N/A 05/27/2019   Procedure: IVC FILTER INSERTION;  Surgeon: Annice Needy, MD;  Location: [facility] INVASIVE CV LAB;  Service: Cardiovascular;  Laterality: N/A;     reports that she quit smoking about 19 years ago. Her smoking use included cigarettes. She has never used smokeless tobacco. She reports previous alcohol use. She reports that she does not use drugs.  Allergies  Allergen Reactions  . Naproxen Hives and Shortness Of Breath    No family history on file.   Prior to Admission medications   Medication Sig Start Date End Date Taking? Authorizing Provider  acetaminophen (TYLENOL) 325 MG tablet Take 650 mg by mouth every 6 (six) hours as needed for mild pain.   Yes [provider]  acidophilus (RISAQUAD) CAPS capsule Take 1 capsule by mouth daily.   Yes [provider]  alendronate (FOSAMAX) 70 MG tablet Take 70 mg by mouth every Friday. Take with a full glass  of water on an empty stomach.    Yes [provider]  alum & mag hydroxide-simeth (MINTOX) 200-200-20 MG/5ML suspension Take 30 mLs by mouth every 6 (six) hours as needed for indigestion or heartburn.   Yes [provider]  Calcium Carb-Cholecalciferol 600-400 MG-UNIT TABS Take 1 tablet by mouth 2 (two) times a day.    Yes [provider]  citalopram (CELEXA) 10 MG tablet Take 10 mg by mouth daily.   Yes  [provider]  furosemide (LASIX) 20 MG tablet Take 20 mg by mouth daily.    Yes [provider]  guaifenesin (ROBITUSSIN) 100 MG/5ML syrup Take 200 mg by mouth every 6 (six) hours as needed for cough or congestion. (max 4 doses daily)   Yes [provider]  loperamide (IMODIUM A-D) 2 MG tablet Take 1 tablet (2 mg total) by mouth 4 (four) times daily as needed for diarrhea or loose stools. 05/29/19  Yes Dhungel, Nishant, MD  lovastatin (MEVACOR) 20 MG tablet Take 40 mg by mouth at bedtime.   Yes [provider]  magnesium hydroxide (MILK OF MAGNESIA) 400 MG/5ML suspension Take 30 mLs by mouth at bedtime as needed for mild constipation.    Yes [provider]  Multiple Vitamin (MULTIVITAMIN WITH MINERALS) TABS tablet Take 1 tablet by mouth daily. 05/29/19  Yes Pennie Banter, DO  neomycin-bacitracin-polymyxin (NEOSPORIN) OINT Apply 1 application topically 4 (four) times daily as needed for wound care.   Yes [provider]  sodium chloride (OCEAN) 0.65 % SOLN nasal spray Place 1 spray into both nostrils 3 (three) times daily.    Yes [provider]  feeding supplement, ENSURE ENLIVE, (ENSURE ENLIVE) LIQD Take 237 mLs by mouth 2 (two) times daily between meals. 05/29/19   Esaw Grandchild A, DO  insulin aspart (NOVOLOG) 100 UNIT/ML injection Inject 0-5 Units into the skin at bedtime. Patient not taking: Reported on 05/24/2019 04/16/19   Thomasenia Bottoms, MD  Saccharomyces boulardii (PROBIOTIC) 250 MG CAPS Take 1 each by mouth 2 (two) times daily. 04/16/19   Thomasenia Bottoms, MD    Physical Exam: Vitals:   06/13/19 1815 06/13/19 1830 06/13/19 1845 06/13/19 1900  BP:  (!) 96/58    Pulse:      Resp: Temp:      TempSrc:      SpO2:      Weight:      Height:         Vitals:   06/13/19 1815 06/13/19 1830 06/13/19 1845 06/13/19 1900  BP:  (!) 96/58    Pulse:      Resp: Temp:      TempSrc:      SpO2:       Weight:      Height:        Constitutional: Alert and awake, oriented x2, not in any acute distress. Eyes: PERLA, EOMI, irises appear normal, anicteric sclera,  ENMT: external ears and nose appear normal, normal hearing             Lips appears normal, oropharynx mucosa, tongue, posterior pharynx appear normal  Neck: neck appears normal, no masses, normal ROM, no thyromegaly, no JVD  CVS: S1-S2 clear, no murmur rubs or gallops,  , no carotid bruits, pedal pulses palpable, 2+ pitting edema bilateral lower extremities Respiratory: Somewhat diminished bilaterally, no wheezing, rales or rhonchi. Respiratory effort normal. No accessory muscle use.  Abdomen: soft nontender, nondistended,  normal bowel sounds, no hepatosplenomegaly, no hernias Musculoskeletal: : no cyanosis, clubbing , no contractures or atrophy Neuro: Cranial nerves II-XII intact, sensation, reflexes normal, strength Psych, stable mood and affect,  Skin: no rashes or lesions or ulcers, no induration or nodules   Labs on Admission: I have personally reviewed following labs and imaging studies  CBC: Recent Labs  Lab 06/13/19 1335  WBC 6.3  NEUTROABS 4.0  HGB 7.9*  HCT 24.0*  MCV 87.9  PLT 145*   Basic Metabolic Panel: Recent Labs  Lab 06/13/19 1335  NA 137  K 3.4*  CL 100  CO2 27  GLUCOSE 119*  BUN 18  CREATININE 1.39*  CALCIUM 8.6*   GFR: Estimated Creatinine Clearance: 25.6 mL/min (A) (by C-G formula based on SCr of 1.39 mg/dL (H)). Liver Function Tests: Recent Labs  Lab 06/13/19 1335  AST 14*  ALT 9  ALKPHOS 51  BILITOT 1.7*  PROT 6.4*  ALBUMIN 3.3*   No results for input(s): LIPASE, AMYLASE in the last 168 hours. No results for input(s): AMMONIA in the last 168 hours. Coagulation Profile: Recent Labs  Lab 06/13/19 1335  INR 1.4*   Cardiac Enzymes: No results for input(s): CKTOTAL, CKMB, CKMBINDEX, TROPONINI in the last 168 hours. BNP (last 3 results) No results for input(s): PROBNP in  the last 8760 hours. HbA1C: No results for input(s): HGBA1C in the last 72 hours. CBG: Recent Labs  Lab 06/13/19 1333  GLUCAP 105*   Lipid Profile: No results for input(s): CHOL, HDL, LDLCALC, TRIG, CHOLHDL, LDLDIRECT in the last 72 hours. Thyroid Function Tests: Recent Labs    06/13/19 1645  TSH 1.198  FREET4 0.76   Anemia Panel: No results for input(s): VITAMINB12, FOLATE, FERRITIN, TIBC, IRON, RETICCTPCT in the last 72 hours. Urine analysis:    Component Value Date/Time   COLORURINE YELLOW (A) 06/13/2019 1335   APPEARANCEUR CLEAR (A) 06/13/2019 1335   LABSPEC 1.019 06/13/2019 1335   PHURINE 5.0 06/13/2019 1335   GLUCOSEU NEGATIVE 06/13/2019 1335   HGBUR NEGATIVE 06/13/2019 1335   BILIRUBINUR NEGATIVE 06/13/2019 1335   KETONESUR NEGATIVE 06/13/2019 1335   PROTEINUR NEGATIVE 06/13/2019 1335   NITRITE POSITIVE (A) 06/13/2019 1335   LEUKOCYTESUR MODERATE (A) 06/13/2019 1335    Radiological Exams on Admission: DG Forearm Left  Result Date: 06/13/2019 CLINICAL DATA:  Left forearm pain. EXAM: LEFT FOREARM - 2 VIEW COMPARISON:  None. FINDINGS: There is no evidence of fracture or other focal bone lesions. Wrist and elbow alignment are maintained. There are vascular calcifications. Soft tissues are unremarkable. IMPRESSION: Negative radiographs of the left forearm. Electronically Signed   By: Narda Rutherford M.D.   On: 06/13/2019 13:55   CT Head Wo Contrast  Result Date: 06/13/2019 CLINICAL DATA:  Patient from [facility] assisted living d/t episodes of near syncope. with history of dementia, pulmonary embolus , CHF and hysterectomy. EXAM: CT HEAD WITHOUT CONTRAST CT CERVICAL SPINE WITHOUT CONTRAST TECHNIQUE: Multidetector CT imaging of the head and cervical spine was performed following the standard protocol without intravenous contrast. Multiplanar CT image reconstructions of the cervical spine were also generated. COMPARISON:  04/12/2019 FINDINGS: CT HEAD FINDINGS Brain: No  evidence of acute infarction, hemorrhage, hydrocephalus, extra-axial collection or mass lesion/mass effect. There is ventricular sulcal enlargement reflecting moderate diffuse atrophy. Old lacunar infarcts are noted in the thalami and left caudate nucleus head. Patchy white matter hypoattenuation is present bilaterally consistent with moderate chronic microvascular ischemic change. Vascular: No hyperdense vessel or unexpected calcification. Skull: Normal.  Negative for fracture or focal lesion. Sinuses/Orbits: Visualized globes and orbits are unremarkable. The visualized sinuses and mastoid air cells are clear. Other: None. CT CERVICAL SPINE FINDINGS Alignment: Normal. Skull base and vertebrae: No acute fracture. No primary bone lesion or focal pathologic process. Soft tissues and spinal canal: No prevertebral fluid or swelling. No visible canal hematoma. Disc levels: Mild loss of disc height at C4-C5 and C5-C6 with moderate to marked loss of disc height at C6-C7 and moderate loss of disc height at C7-T1. Facet degenerative changes are noted most prominently on the left at C3-C4. No significant disc bulging and no disc herniation. Upper chest: Unremarkable.  Lung apices are clear. Other: None. IMPRESSION: HEAD CT 1. No acute intracranial abnormalities. 2. Atrophy, chronic microvascular ischemic change and old lacunar infarcts, stable from the prior head CT. CERVICAL CT 1. No fracture or acute finding. Electronically Signed   By: Amie Portlandavid  Ormond M.D.   On: 06/13/2019 15:41   CT Angio Chest PE W and/or Wo Contrast  Result Date: 06/13/2019 CLINICAL DATA:  Shortness of breath EXAM: CT ANGIOGRAPHY CHEST CT ABDOMEN AND PELVIS WITH CONTRAST TECHNIQUE: Multidetector CT imaging of the chest was performed using the standard protocol during bolus administration of intravenous contrast. Multiplanar CT image reconstructions and MIPs were obtained to evaluate the vascular anatomy. Multidetector CT imaging of the abdomen and  pelvis was performed using the standard protocol during bolus administration of intravenous contrast. CONTRAST:  75mL OMNIPAQUE IOHEXOL 350 MG/ML SOLN COMPARISON:  None. FINDINGS: CTA CHEST FINDINGS Cardiovascular: There are advanced atherosclerotic changes throughout the thoracic aorta. 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 There is no evidence for a thoracic aortic dissection. The main pulmonary artery is mildly dilated measuring approximately 3.3 cm in diameter. There is no acute pulmonary embolism. Residual nonocclusive thrombus is noted within the main right pulmonary artery (axial series 3, image 121). The heart size is borderline enlarged. There is no significant pericardial effusion. Mediastinum/Nodes: --No mediastinal or hilar lymphadenopathy. --No axillary lymphadenopathy. --No supraclavicular lymphadenopathy. --Normal thyroid gland. --The esophagus is unremarkable Lungs/Pleura: There is a pulmonary nodule in the right middle lobe measuring approximately 1.2 cm (axial series 4, image 49). This is stable since prior study in February. The trachea is unremarkable. Mild emphysematous changes are noted. There is no significant pleural effusion. No focal infiltrate. Musculoskeletal: No chest wall abnormality. No acute or significant osseous findings. Review of the MIP images confirms the above findings. CT ABDOMEN and PELVIS FINDINGS Hepatobiliary: The liver is normal. Cholelithiasis without acute inflammation.There is no biliary ductal dilation. Pancreas: Normal contours without ductal dilatation. No peripancreatic fluid collection. Spleen: No splenic laceration or hematoma. Adrenals/Urinary Tract: --Adrenal glands: No adrenal hemorrhage. --Right kidney/ureter: No hydronephrosis or perinephric hematoma. --Left kidney/ureter: There are multiple left-sided peripelvic cysts. There is no evidence for obstructing left-sided stone. --Urinary bladder: There is mild diffuse bladder wall thickening  with adjacent inflammatory changes. Stomach/Bowel: --Stomach/Duodenum: No hiatal hernia or other gastric abnormality. Normal duodenal course and caliber. --Small bowel: No dilatation or inflammation. --Colon: Rectosigmoid diverticulosis without acute inflammation. --Appendix: Not visualized. No right lower quadrant inflammation or free fluid. Vascular/Lymphatic: Atherosclerotic calcification is present within the non-aneurysmal abdominal aorta, without hemodynamically significant stenosis. Findings are suspicious for right common femoral DVT and right superficial femoral vein DVT. There is a well-positioned IVC filter in place. --No retroperitoneal lymphadenopathy. --No mesenteric lymphadenopathy. --No pelvic or inguinal lymphadenopathy. Reproductive: Status post hysterectomy. No adnexal mass. Other: No ascites or free air. The abdominal wall  is normal. Musculoskeletal. No acute displaced fractures. Review of the MIP images confirms the above findings. IMPRESSION: 1. No acute pulmonary embolism. 2. Residual nonocclusive thrombus within the main right pulmonary artery. Overall significant interval improvement in PE burden since prior study dated 05/24/2019. 3. Findings suspicious for right common femoral and right superficial femoral vein DVT. Correlation with ultrasound is recommended. 4. Mild diffuse bladder wall thickening with adjacent inflammatory changes. Correlation with urinalysis is recommended to help exclude underlying cystitis. 5. Cholelithiasis without acute inflammation. 6. Rectosigmoid diverticulosis without evidence of acute diverticulitis. 7. Right middle lobe pulmonary nodule measuring approximately 1.2 cm, stable since prior study dated 05/24/2019. Consider one of the following in 3 months for both low-risk and high-risk individuals: (a) repeat chest CT, (b) follow-up PET-CT, or (c) tissue sampling. This recommendation follows the consensus statement: Guidelines for Management of Incidental  Pulmonary Nodules Detected on CT Images: From the Fleischner Society 2017; Radiology 2017; [id number]:[id number]. 8. Well-positioned IVC filter in place. 9. Moderate stool burden. Aortic Atherosclerosis (ICD10-I70.0). Electronically Signed   By: Katherine Mantle M.D.   On: 06/13/2019 15:57   CT Cervical Spine Wo Contrast  Result Date: 06/13/2019 CLINICAL DATA:  Patient from [facility] assisted living d/t episodes of near syncope. with history of dementia, pulmonary embolus , CHF and hysterectomy. EXAM: CT HEAD WITHOUT CONTRAST CT CERVICAL SPINE WITHOUT CONTRAST TECHNIQUE: Multidetector CT imaging of the head and cervical spine was performed following the standard protocol without intravenous contrast. Multiplanar CT image reconstructions of the cervical spine were also generated. COMPARISON:  04/12/2019 FINDINGS: CT HEAD FINDINGS Brain: No evidence of acute infarction, hemorrhage, hydrocephalus, extra-axial collection or mass lesion/mass effect. There is ventricular sulcal enlargement reflecting moderate diffuse atrophy. Old lacunar infarcts are noted in the thalami and left caudate nucleus head. Patchy white matter hypoattenuation is present bilaterally consistent with moderate chronic microvascular ischemic change. Vascular: No hyperdense vessel or unexpected calcification. Skull: Normal. Negative for fracture or focal lesion. Sinuses/Orbits: Visualized globes and orbits are unremarkable. The visualized sinuses and mastoid air cells are clear. Other: None. CT CERVICAL SPINE FINDINGS Alignment: Normal. Skull base and vertebrae: No acute fracture. No primary bone lesion or focal pathologic process. Soft tissues and spinal canal: No prevertebral fluid or swelling. No visible canal hematoma. Disc levels: Mild loss of disc height at C4-C5 and C5-C6 with moderate to marked loss of disc height at C6-C7 and moderate loss of disc height at C7-T1. Facet degenerative changes are noted most prominently on the left at C3-C4.  No significant disc bulging and no disc herniation. Upper chest: Unremarkable.  Lung apices are clear. Other: None. IMPRESSION: HEAD CT 1. No acute intracranial abnormalities. 2. Atrophy, chronic microvascular ischemic change and old lacunar infarcts, stable from the prior head CT. CERVICAL CT 1. No fracture or acute finding. Electronically Signed   By: Amie Portland M.D.   On: 06/13/2019 15:41   CT ABDOMEN PELVIS W CONTRAST  Result Date: 06/13/2019 CLINICAL DATA:  Shortness of breath EXAM: CT ANGIOGRAPHY CHEST CT ABDOMEN AND PELVIS WITH CONTRAST TECHNIQUE: Multidetector CT imaging of the chest was performed using the standard protocol during bolus administration of intravenous contrast. Multiplanar CT image reconstructions and MIPs were obtained to evaluate the vascular anatomy. Multidetector CT imaging of the abdomen and pelvis was performed using the standard protocol during bolus administration of intravenous contrast. CONTRAST:  43mL OMNIPAQUE IOHEXOL 350 MG/ML SOLN COMPARISON:  None. FINDINGS: CTA CHEST FINDINGS Cardiovascular: There are advanced atherosclerotic changes throughout the thoracic aorta. Incidentally  noted is an aberrant right subclavian artery. There is no evidence for a thoracic aortic dissection. The main pulmonary artery is mildly dilated measuring approximately 3.3 cm in diameter. There is no acute pulmonary embolism. Residual nonocclusive thrombus is noted within the main right pulmonary artery (axial series 3, image 121). The heart size is borderline enlarged. There is no significant pericardial effusion. Mediastinum/Nodes: --No mediastinal or hilar lymphadenopathy. --No axillary lymphadenopathy. --No supraclavicular lymphadenopathy. --Normal thyroid gland. --The esophagus is unremarkable Lungs/Pleura: There is a pulmonary nodule in the right middle lobe measuring approximately 1.2 cm (axial series 4, image 49). This is stable since prior study in February. The trachea is unremarkable.  Mild emphysematous changes are noted. There is no significant pleural effusion. No focal infiltrate. Musculoskeletal: No chest wall abnormality. No acute or significant osseous findings. Review of the MIP images confirms the above findings. CT ABDOMEN and PELVIS FINDINGS Hepatobiliary: The liver is normal. Cholelithiasis without acute inflammation.There is no biliary ductal dilation. Pancreas: Normal contours without ductal dilatation. No peripancreatic fluid collection. Spleen: No splenic laceration or hematoma. Adrenals/Urinary Tract: --Adrenal glands: No adrenal hemorrhage. --Right kidney/ureter: No hydronephrosis or perinephric hematoma. --Left kidney/ureter: There are multiple left-sided peripelvic cysts. There is no evidence for obstructing left-sided stone. --Urinary bladder: There is mild diffuse bladder wall thickening with adjacent inflammatory changes. Stomach/Bowel: --Stomach/Duodenum: No hiatal hernia or other gastric abnormality. Normal duodenal course and caliber. --Small bowel: No dilatation or inflammation. --Colon: Rectosigmoid diverticulosis without acute inflammation. --Appendix: Not visualized. No right lower quadrant inflammation or free fluid. Vascular/Lymphatic: Atherosclerotic calcification is present within the non-aneurysmal abdominal aorta, without hemodynamically significant stenosis. Findings are suspicious for right common femoral DVT and right superficial femoral vein DVT. There is a well-positioned IVC filter in place. --No retroperitoneal lymphadenopathy. --No mesenteric lymphadenopathy. --No pelvic or inguinal lymphadenopathy. Reproductive: Status post hysterectomy. No adnexal mass. Other: No ascites or free air. The abdominal wall is normal. Musculoskeletal. No acute displaced fractures. Review of the MIP images confirms the above findings. IMPRESSION: 1. No acute pulmonary embolism. 2. Residual nonocclusive thrombus within the main right pulmonary artery. Overall significant  interval improvement in PE burden since prior study dated 05/24/2019. 3. Findings suspicious for right common femoral and right superficial femoral vein DVT. Correlation with ultrasound is recommended. 4. Mild diffuse bladder wall thickening with adjacent inflammatory changes. Correlation with urinalysis is recommended to help exclude underlying cystitis. 5. Cholelithiasis without acute inflammation. 6. Rectosigmoid diverticulosis without evidence of acute diverticulitis. 7. Right middle lobe pulmonary nodule measuring approximately 1.2 cm, stable since prior study dated 05/24/2019. Consider one of the following in 3 months for both low-risk and high-risk individuals: (a) repeat chest CT, (b) follow-up PET-CT, or (c) tissue sampling. This recommendation follows the consensus statement: Guidelines for Management of Incidental Pulmonary Nodules Detected on CT Images: From the Fleischner Society 2017; Radiology 2017; 284:228-243. 8. Well-positioned IVC filter in place. 9. Moderate stool burden. Aortic Atherosclerosis (ICD10-I70.0). Electronically Signed   By: Constance Holster M.D.   On: 06/13/2019 15:57    EKG: Independently reviewed.   Assessment/Plan    Acute metabolic encephalopathy   UTI (urinary tract infection) -Patient presenting with decreased responsiveness, increased somnolence, above baseline for dementia all likely secondary to UTI -IV Rocephin -Monitor neurologic status -Fall and aspiration precaution  Acute  Bilateral lower extremity occlusive DVT History of recurrent pulmonary embolus (PE)   S/P insertion of IVC (inferior vena caval) filter -Patient has a history of recurrent PE, previously taken off of Eliquis 04/2019 due  to fall history, with recentsubmassive PE 05/24/19 , treated with IVC filter due to the development of GI bleed with heparin -Patient now presents bilateral lower extremity edema -CT abdomen and pelvis showed suspicion for right common femoral and right superficial  DVT -Bilateral lower extremity Dopplers done following admission shows extensive occlusive DVT up to the common femoral -Spoke with vascular surgeon Dr. Marena Chancy who will see patient in the a.m. -Keep leg elevated    AKI (acute kidney injury) (HCC)   CKD (chronic kidney disease), stage III -Believe related to dehydration from decreased oral intake secondary to altered mental status -Creatinine 1.39 above baseline of 1.08 for his stage III kidney disease -IV hydration but with monitoring for fluid overload in view of heart failure history    Hypotension -Blood pressure noted to be low in the ER at 90/48 though review of record showing that patient runs a low blood pressure -Sepsis not suspected at this time, -Suspect etiology related to poor oral intake on account of above acute conditions -Continue IV hydration following ER bolus flow with monitoring for fluid overload -Intake and output monitoring -Hold home antihypertensives    Anemia -Hemoglobin 7.9, down from 8.5 when discharged over a week ago -Believed residual from episode of GI bleed related to heparin used in the treatment of her recent PE a couple weeks prior -Patient has been typed and screened, no need for transfusion as yet -Continue to monitor H&H    Dementia (HCC) without behavioral disturbance -Increase nursing care as needed    Diabetes (HCC) mellitus type II -Hold home basal insulin.  Metformin was previously discontinued due to her renal dysfunction -Sliding scale coverage    Chronic diastolic CHF (congestive heart failure) (HCC) -Not acutely exacerbated -Monitor for exacerbation in view of IV fluid hydration outlined above --Hold Lasix for now.  Not currently on beta-blocker or ACE inhibitor, possibly related to hypotension and kidney disease -Intake and output monitoring and daily weights      DVT prophylaxis: Lovenox  Code Status: full code  Family Communication:  none  Disposition Plan: Back to  previous home environment Consults called: dr. Gilda Crease Status:inp    Andris Baumann MD [facility]     06/13/2019, 7:44 PM

## 2019-06-13 NOTE — ED Notes (Signed)
Pt with episode of "near syncope" with EDP Fuller Plan and this RN present in room. Pt tucks her chin into her chest and closes her eyes. Pt will respond to a loud voice but is slow to answer and sometimes responds inappropriately.

## 2019-06-13 NOTE — ED Notes (Signed)
Pt cleansed and briefs changed as she had small BM. Had little urine in briefs. Bed linens changed. Give drink with verbal okay from EDP Funke. Family at bedside.

## 2019-06-13 NOTE — ED Notes (Addendum)
Pt repositioned with two pillows at she favors the right side. Imaging at bedside. Bed locked low. Rails up. Call bell within reach.

## 2019-06-13 NOTE — ED Notes (Signed)
Pt incontinent of stool and urine. Peri care done, purewick and clean depends in place. Pt without complaints at this time, monitor in place. Awaiting bed assignment.

## 2019-06-14 ENCOUNTER — Other Ambulatory Visit: Payer: Self-pay

## 2019-06-14 DIAGNOSIS — R600 Localized edema: Secondary | ICD-10-CM | POA: Diagnosis not present

## 2019-06-14 DIAGNOSIS — N183 Chronic kidney disease, stage 3 unspecified: Secondary | ICD-10-CM

## 2019-06-14 DIAGNOSIS — E860 Dehydration: Secondary | ICD-10-CM | POA: Diagnosis present

## 2019-06-14 DIAGNOSIS — I5032 Chronic diastolic (congestive) heart failure: Secondary | ICD-10-CM

## 2019-06-14 DIAGNOSIS — E1122 Type 2 diabetes mellitus with diabetic chronic kidney disease: Secondary | ICD-10-CM | POA: Diagnosis present

## 2019-06-14 DIAGNOSIS — Z79899 Other long term (current) drug therapy: Secondary | ICD-10-CM | POA: Diagnosis not present

## 2019-06-14 DIAGNOSIS — G9341 Metabolic encephalopathy: Secondary | ICD-10-CM | POA: Diagnosis present

## 2019-06-14 DIAGNOSIS — N179 Acute kidney failure, unspecified: Secondary | ICD-10-CM | POA: Diagnosis not present

## 2019-06-14 DIAGNOSIS — D649 Anemia, unspecified: Secondary | ICD-10-CM | POA: Diagnosis not present

## 2019-06-14 DIAGNOSIS — D500 Iron deficiency anemia secondary to blood loss (chronic): Secondary | ICD-10-CM | POA: Diagnosis present

## 2019-06-14 DIAGNOSIS — Z20822 Contact with and (suspected) exposure to covid-19: Secondary | ICD-10-CM | POA: Diagnosis present

## 2019-06-14 DIAGNOSIS — N1831 Chronic kidney disease, stage 3a: Secondary | ICD-10-CM | POA: Diagnosis present

## 2019-06-14 DIAGNOSIS — Z7983 Long term (current) use of bisphosphonates: Secondary | ICD-10-CM | POA: Diagnosis not present

## 2019-06-14 DIAGNOSIS — I82433 Acute embolism and thrombosis of popliteal vein, bilateral: Secondary | ICD-10-CM | POA: Diagnosis present

## 2019-06-14 DIAGNOSIS — I959 Hypotension, unspecified: Secondary | ICD-10-CM

## 2019-06-14 DIAGNOSIS — Z95828 Presence of other vascular implants and grafts: Secondary | ICD-10-CM | POA: Diagnosis not present

## 2019-06-14 DIAGNOSIS — I82409 Acute embolism and thrombosis of unspecified deep veins of unspecified lower extremity: Secondary | ICD-10-CM

## 2019-06-14 DIAGNOSIS — F0150 Vascular dementia without behavioral disturbance: Secondary | ICD-10-CM | POA: Diagnosis present

## 2019-06-14 DIAGNOSIS — N3090 Cystitis, unspecified without hematuria: Secondary | ICD-10-CM

## 2019-06-14 DIAGNOSIS — M810 Age-related osteoporosis without current pathological fracture: Secondary | ICD-10-CM | POA: Diagnosis present

## 2019-06-14 DIAGNOSIS — R911 Solitary pulmonary nodule: Secondary | ICD-10-CM | POA: Diagnosis present

## 2019-06-14 DIAGNOSIS — I82413 Acute embolism and thrombosis of femoral vein, bilateral: Secondary | ICD-10-CM | POA: Diagnosis present

## 2019-06-14 DIAGNOSIS — G9340 Encephalopathy, unspecified: Secondary | ICD-10-CM | POA: Diagnosis present

## 2019-06-14 DIAGNOSIS — Z86711 Personal history of pulmonary embolism: Secondary | ICD-10-CM | POA: Diagnosis not present

## 2019-06-14 DIAGNOSIS — I824Y3 Acute embolism and thrombosis of unspecified deep veins of proximal lower extremity, bilateral: Secondary | ICD-10-CM

## 2019-06-14 DIAGNOSIS — Z87440 Personal history of urinary (tract) infections: Secondary | ICD-10-CM | POA: Diagnosis not present

## 2019-06-14 DIAGNOSIS — I824Z2 Acute embolism and thrombosis of unspecified deep veins of left distal lower extremity: Secondary | ICD-10-CM | POA: Diagnosis present

## 2019-06-14 DIAGNOSIS — Z794 Long term (current) use of insulin: Secondary | ICD-10-CM | POA: Diagnosis not present

## 2019-06-14 DIAGNOSIS — N390 Urinary tract infection, site not specified: Secondary | ICD-10-CM | POA: Diagnosis present

## 2019-06-14 DIAGNOSIS — Z9181 History of falling: Secondary | ICD-10-CM | POA: Diagnosis not present

## 2019-06-14 DIAGNOSIS — Z87891 Personal history of nicotine dependence: Secondary | ICD-10-CM | POA: Diagnosis not present

## 2019-06-14 LAB — GLUCOSE, CAPILLARY
Glucose-Capillary: 80 mg/dL (ref 70–99)
Glucose-Capillary: 86 mg/dL (ref 70–99)
Glucose-Capillary: 116 mg/dL — ABNORMAL HIGH (ref 70–99)

## 2019-06-14 LAB — BASIC METABOLIC PANEL
Anion gap: 6 (ref 5–15)
BUN: 15 mg/dL (ref 8–23)
CO2: 28 mmol/L (ref 22–32)
Calcium: 7.7 mg/dL — ABNORMAL LOW (ref 8.9–10.3)
Chloride: 105 mmol/L (ref 98–111)
Creatinine, Ser: 1.19 mg/dL — ABNORMAL HIGH (ref 0.44–1.00)
GFR calc Af Amer: 48 mL/min — ABNORMAL LOW (ref >60)
GFR calc non Af Amer: 42 mL/min — ABNORMAL LOW (ref >60)
Glucose, Bld: 92 mg/dL (ref 70–99)
Potassium: 3.3 mmol/L — ABNORMAL LOW (ref 3.5–5.1)
Sodium: 139 mmol/L (ref 135–145)

## 2019-06-14 LAB — CBC
HCT: 22.3 % — ABNORMAL LOW (ref 36.0–46.0)
Hemoglobin: 7.1 g/dL — ABNORMAL LOW (ref 12.0–15.0)
MCH: 28.4 pg (ref 26.0–34.0)
MCHC: 31.8 g/dL (ref 30.0–36.0)
MCV: 89.2 fL (ref 80.0–100.0)
Platelets: 153 10*3/uL (ref 150–400)
RBC: 2.5 MIL/uL — ABNORMAL LOW (ref 3.87–5.11)
RDW: 13.5 % (ref 11.5–15.5)
WBC: 6.2 10*3/uL (ref 4.0–10.5)
nRBC: 0 % (ref 0.0–0.2)

## 2019-06-14 LAB — FERRITIN: Ferritin: 98 ng/mL (ref 11–307)

## 2019-06-14 LAB — IRON AND TIBC
Iron: 23 ug/dL — ABNORMAL LOW (ref 28–170)
Saturation Ratios: 13 % (ref 10.4–31.8)
TIBC: 175 ug/dL — ABNORMAL LOW (ref 250–450)
UIBC: 152 ug/dL

## 2019-06-14 LAB — PREPARE RBC (CROSSMATCH)

## 2019-06-14 LAB — ABO/RH: ABO/RH(D): A POS

## 2019-06-14 LAB — HEMOGLOBIN A1C
Hgb A1c MFr Bld: 5.5 % (ref 4.8–5.6)
Mean Plasma Glucose: 111.15 mg/dL

## 2019-06-14 LAB — SARS CORONAVIRUS 2 (TAT 6-24 HRS): SARS Coronavirus 2: NEGATIVE

## 2019-06-14 MED ORDER — SODIUM CHLORIDE 0.9% IV SOLUTION
Freq: Once | INTRAVENOUS | Status: AC
  Filled 2019-06-14: qty 250

## 2019-06-14 NOTE — ED Notes (Signed)
Daughter states pt's left hand is notably swollen compared to yesterday. Left hand does appear swollen compared to right hand. Skin tear and bruising noted on left forearm, daughter states this is from fall at SNF. Warm compress applied and coban loosened at IV site. IV site on left upper arm appears clean, dry, intact, blood return noted, infusing well, no redness or swelling noted at infusion site.

## 2019-06-14 NOTE — Evaluation (Signed)
Physical Therapy Evaluation Patient Details Name: Torre Pikus MRN: [id number] DOB: [birth year] Today's Date: 06/14/2019   History of Present Illness  Per MD notes: Pt is an 86 y/o F who presented to the ED on 06/13/2019 from [facility] d/t episodes of near syncope. PMH includes DM, CHF, dementia, hx of PE s/p recent IVC filter placement, and CKD. Current MD assessment includes: acute metabolic encephalopathy, acute on chronic kidney disease, and bilateral lower extremity edema with occlusive DVT's.    Clinical Impression  Pt pleasant and motivated to participate during the session.  Pt was pleasantly confused and required extra time and cuing to initiate tasks and maintain effort but ultimately performed well during the session.  Pt only required min A with bed mobility tasks and CGA with transfers and gait with HR WNL throughout, unable to obtain an SpO2 reading with the pulsox.  Pt will benefit from HHPT services upon discharge to safely address deficits listed in patient problem list for decreased caregiver assistance and eventual return to PLOF.      Follow Up Recommendations Home health PT;Supervision/Assistance - 24 hour    Equipment Recommendations  None recommended by PT    Recommendations for Other Services       Precautions / Restrictions Precautions Precautions: Fall Restrictions Weight Bearing Restrictions: No      Mobility  Bed Mobility Overal bed mobility: Needs Assistance Bed Mobility: Supine to Sit     Supine to sit: Min assist     General bed mobility comments: Pt required mod verbal cues to initiate and maintain effort but ultimately only required min A along with extra time to go from sup to sit  Transfers Overall transfer level: Needs assistance Equipment used: Rolling walker (2 wheeled) Transfers: Sit to/from Stand Sit to Stand: Min guard;From elevated surface         General transfer comment: Min verbal cues for sequencing  with extra effort required to stand but no physical assistance needed.  Ambulation/Gait Ambulation/Gait assistance: Min guard Gait Distance (Feet): 70 Feet Assistive device: Rolling walker (2 wheeled) Gait Pattern/deviations: Step-through pattern;Decreased step length - right;Decreased step length - left;Trunk flexed Gait velocity: decreased   General Gait Details: Slow cadence with flexed trunk posture but steady without LOB  Stairs            Wheelchair Mobility    Modified Rankin (Stroke Patients Only)       Balance Overall balance assessment: Needs assistance Sitting-balance support: Feet supported Sitting balance-Leahy Scale: Good     Standing balance support: Bilateral upper extremity supported Standing balance-Leahy Scale: Fair Standing balance comment: Mod lean on the RW for support                             Pertinent Vitals/Pain Pain Assessment: No/denies pain    Home Living Family/patient expects to be discharged to:: Assisted living               Home Equipment: Walker - 2 wheels Additional Comments: Pt is unreliable historian with home living and PLOF provided by daughter.    Prior Function Level of Independence: Needs assistance   Gait / Transfers Assistance Needed: per daughter pt has been ambulating with RW with SBA limited facility distances, one fall in the last 6 months while getting up to go to [facility]  ADL's / Homemaking Assistance Needed: Assist from staff for bathing, dressing, meals and meds  Comments: Pt  is a resident at [facility]        Extremity/Trunk Assessment   Upper Extremity Assessment Upper Extremity Assessment: Generalized weakness    Lower Extremity Assessment Lower Extremity Assessment: Generalized weakness       Communication      Cognition Arousal/Alertness: Awake/alert Behavior During Therapy: WFL for tasks assessed/performed Overall Cognitive Status: History of  cognitive impairments - at baseline                                        General Comments      Exercises Total Joint Exercises Ankle Circles/Pumps: AROM;10 reps;Both Quad Sets: Both;10 reps;Strengthening Heel Slides: AAROM;Left;5 reps Hip ABduction/ADduction: AAROM;Both;10 reps;Strengthening Long Arc Quad: AROM;Strengthening;Both;15 reps Knee Flexion: AROM;Strengthening;Both;15 reps   Assessment/Plan    PT Assessment Patient needs continued PT services  PT Problem List Decreased strength;Decreased activity tolerance;Decreased balance;Decreased mobility       PT Treatment Interventions Gait training;Therapeutic exercise;Balance training;DME instruction;Functional mobility training;Therapeutic activities;Patient/family education    PT Goals (Current goals can be found in the Care Plan section)  Acute Rehab PT Goals Patient Stated Goal: To walk PT Goal Formulation: With patient Time For Goal Achievement: 06/27/19 Potential to Achieve Goals: Good    Frequency Min 2X/week   Barriers to discharge        Co-evaluation               AM-PAC PT "6 Clicks" Mobility  Outcome Measure Help needed turning from your back to your side while in a flat bed without using bedrails?: A Little Help needed moving from lying on your back to sitting on the side of a flat bed without using bedrails?: A Little Help needed moving to and from a bed to a chair (including a wheelchair)?: A Little Help needed standing up from a chair using your arms (e.g., wheelchair or bedside chair)?: A Little Help needed to walk in hospital room?: A Little Help needed climbing 3-5 steps with a railing? : A Lot 6 Click Score: 17    End of Session Equipment Utilized During Treatment: Gait belt Activity Tolerance: Patient tolerated treatment well Patient left: in chair;with chair alarm set;with family/visitor present;with call bell/phone within reach Nurse Communication: Mobility  status PT Visit Diagnosis: Muscle weakness (generalized) (M62.81);Difficulty in walking, not elsewhere classified (R26.2)    Time: 5188-4166 PT Time Calculation (min) (ACUTE ONLY): 37 min   Charges:   PT Evaluation $PT Eval Moderate Complexity: 1 Mod PT Treatments $Therapeutic Exercise: 8-22 mins        D. Royetta Asal PT, DPT 06/14/19, 5:13 PM

## 2019-06-14 NOTE — Progress Notes (Signed)
PT Cancellation Note  Patient Details Name: Jasmine Hubbard MRN: [id number] DOB: 1932/03/21   Cancelled Treatment:    Reason Eval/Treat Not Completed: Other (comment).  PT consult received.  Chart reviewed.  Pt was receiving blood transfusion earlier this afternoon and then upon PT arrival to ED later in afternoon, pt was not in room (pt had recently transferred to floor).  Will re-attempt PT evaluation at a later date/time.  Hendricks Limes, PT 06/14/19, 3:34 PM

## 2019-06-14 NOTE — ED Notes (Signed)
Pt sleeping on stretcher, appears comfortable. Respirations are equal and unlabored, no signs of acute distress.

## 2019-06-14 NOTE — Progress Notes (Addendum)
PROGRESS NOTE    Jasmine Hubbard  ZWC:[id number] DOB: Nov 25, 1931 DOA: 06/13/2019 PCP: Guillermina City, MD    Brief Narrative:  86 year old female with history of dementia, chronic kidney disease stage III, VTE recent admission for pulmonary embolus.  She was treated with anticoagulation, but hospitalization was complicated by development of GI bleeding.  IVC filter was placed and anticoagulation was discontinued.  She return to assisted living facility where she was noted to be increasingly somnolent yesterday.  She was brought to the hospital for evaluation where she was noted to be mildly dehydrated, urinalysis indicated urinary tract infection.  She was started on antibiotics.  She was also noted to have worsening anemia.  No evidence of bleeding at this time.  She was also found to have bilateral lower extremity DVTs.  Vascular surgery was consulted.   Assessment & Plan:   Principal Problem:   Acute metabolic encephalopathy Active Problems:   Dementia (HCC)   Diabetes (HCC)   Chronic diastolic CHF (congestive heart failure) (HCC)   UTI (urinary tract infection)   CKD (chronic kidney disease), stage III   AKI (acute kidney injury) (HCC)   Anemia   History of pulmonary embolus (PE)   Bilateral lower extremity edema   S/P insertion of IVC (inferior vena caval) filter   Hypotension   1. Acute metabolic encephalopathy, superimposed on baseline dementia, likely precipitated by urinary tract infection.  Patient has been started on intravenous ceftriaxone.  Mental status appears to be improving.  CT head unremarkable. 2. Urinary tract infection.  She does have a history of antibiotic resistant urinary tract infection in the past.  Would continue intravenous ceftriaxone until urine culture results available.   3. Acute kidney injury on chronic kidney disease stage III.  Likely related to dehydration improving with IV fluid 4. Acute on chronic anemia.  Hemoglobin has trended down  over the last several weeks.  She has not had any ongoing issues with bleeding.  Since patient has a history of vascular disease with PE/right heart strain, will try to keep hemoglobin goal greater than 8.  Hemoglobin has trended down to 7.1, will transfuse 1 unit of PRBC.  Continue to follow hemoglobin.  Check iron studies. 5. Acute bilateral lower extremity occlusive DVT, with history of recurrent pulmonary embolus.  During her last admission, patient was admitted for pulmonary embolus with right heart strain.  He was initially placed on anticoagulation, but this had to be discontinued due to development of GI bleeding.  IVC filter was placed on 3/1 by vascular surgery.  Venous Dopplers done on this admission showed bilateral DVTs.  Unclear if this was present during her last admission since she did not have Doppler studies done at that time.  With IVC filter in place, I am unsure if there is any further interventions to offer.  Vascular surgery consulted to see if any further interventions are indicated.  She does not appear to be a candidate for anticoagulation due to history of recent GI bleeding.  Repeat CT angiogram of her chest shows improving clot burden from pulmonary embolus 6. Dementia.  Family reports that patient is normally able to recognize other family members.  She has to be reoriented at times to her surroundings.  She is able to feed herself and can occasionally ambulate with a walker 7. Chronic diastolic congestive heart failure.  Appears compensated at this time. 8. Diabetes.  Currently on sliding scale insulin.  Continue to follow blood sugars. 9. Right middle lobe  pulmonary nodule.  Noted on CT imaging.  Will need repeat CT chest in 3 months for surveillance. 10. Goals of care.  Discussed goals of care with patient's daughter who has requested that patient receive resuscitation including intubation and CPR.  With her advanced age, multiple comorbidities and recurrent admissions, her  long-term prognosis is poor.  Will request palliative care consult to further address goals of care.   DVT prophylaxis: Lovenox Code Status: Full code Family Communication: Discussed with daughter, Toniann Fail over the phone Disposition Plan: Patient is from assisted living facility: [facility].  At baseline, she is able to ambulate with a walker, but can also require a wheelchair.  She receives physical therapy at the assisted living facility.  Anticipate that she may be ready for discharge in the next 24 hours.  Barriers to discharge at this time: 1.  Await vascular surgery input regarding bilateral DVTs, 2.  Await on urine culture results to de-escalate IV antibiotic therapy, since patient does have a history of antibiotic resistant infections in the past, 3.  Continued monitoring of hemoglobin to ensure stability   Consultants:   Vascular surgery  Procedures:     Antimicrobials:   Ceftriaxone 3/18 >   Subjective: Patient is confused.  She does not know she is a hospital.  Does not know date.  Otherwise engages in conversation.  Denies any shortness of breath.  Objective: Vitals:   06/14/19 0331 06/14/19 0430 06/14/19 0500 06/14/19 0600  BP: 111/88 111/70 103/65 106/62  Pulse: (!) 55 60 (!) 59 (!) 54  Resp: 15 15 18 16   Temp:      TempSrc:      SpO2: 100% 97% 94% 97%  Weight:      Height:        Intake/Output Summary (Last 24 hours) at 06/14/2019 1026 Last data filed at 06/14/2019 0020 Gross per 24 hour  Intake 500 ml  Output --  Net 500 ml   Filed Weights   06/13/19 1320  Weight: 57 kg    Examination:  General exam: Appears calm and comfortable  Respiratory system: coarse breath sounds at bases. Respiratory effort normal. Cardiovascular system: S1 & S2 heard, RRR. No JVD, murmurs, rubs, gallops or clicks.  Gastrointestinal system: Abdomen is nondistended, soft and nontender. No organomegaly or masses felt. Normal bowel sounds heard. Central nervous system: No  focal neurological deficits. Extremities: 1-2+ non pitting edema bilaterally Skin: No rashes, lesions or ulcers Psychiatry: confused, pleasant    Data Reviewed: I have personally reviewed following labs and imaging studies  CBC: Recent Labs  Lab 06/13/19 1335 06/13/19 2303 06/14/19 0441  WBC 6.3 6.1 6.2  NEUTROABS 4.0  --   --   HGB 7.9* 7.4* 7.1*  HCT 24.0* 23.2* 22.3*  MCV 87.9 89.6 89.2  PLT 145* 146* 153   Basic Metabolic Panel: Recent Labs  Lab 06/13/19 1335 06/13/19 2303 06/14/19 0441  NA 137 138 139  K 3.4* 3.4* 3.3*  CL 100 104 105  CO2 27 27 28   GLUCOSE 119* 137* 92  BUN 18 16 15   CREATININE 1.39* 1.29* 1.19*  CALCIUM 8.6* 7.7* 7.7*   GFR: Estimated Creatinine Clearance: 29.8 mL/min (A) (by C-G formula based on SCr of 1.19 mg/dL (H)). Liver Function Tests: Recent Labs  Lab 06/13/19 1335  AST 14*  ALT 9  ALKPHOS 51  BILITOT 1.7*  PROT 6.4*  ALBUMIN 3.3*   No results for input(s): LIPASE, AMYLASE in the last 168 hours. No results for  input(s): AMMONIA in the last 168 hours. Coagulation Profile: Recent Labs  Lab 06/13/19 1335  INR 1.4*   Cardiac Enzymes: No results for input(s): CKTOTAL, CKMB, CKMBINDEX, TROPONINI in the last 168 hours. BNP (last 3 results) No results for input(s): PROBNP in the last 8760 hours. HbA1C: Recent Labs    06/13/19 2303  HGBA1C 5.5   CBG: Recent Labs  Lab 06/13/19 1333 06/14/19 0912  GLUCAP 105* 80   Lipid Profile: No results for input(s): CHOL, HDL, LDLCALC, TRIG, CHOLHDL, LDLDIRECT in the last 72 hours. Thyroid Function Tests: Recent Labs    06/13/19 1645  TSH 1.198  FREET4 0.76   Anemia Panel: No results for input(s): VITAMINB12, FOLATE, FERRITIN, TIBC, IRON, RETICCTPCT in the last 72 hours. Sepsis Labs: Recent Labs  Lab 06/13/19 1645  LATICACIDVEN 0.8    Recent Results (from the past 240 hour(s))  SARS CORONAVIRUS 2 (TAT 6-24 HRS) Nasopharyngeal Nasopharyngeal Swab     Status: None    Collection Time: 06/13/19  6:20 PM   Specimen: Nasopharyngeal Swab  Result Value Ref Range Status   SARS Coronavirus 2 NEGATIVE NEGATIVE Final    Comment: (NOTE) SARS-CoV-2 target nucleic acids are NOT DETECTED. The SARS-CoV-2 RNA is generally detectable in upper and lower respiratory specimens during the acute phase of infection. Negative results do not preclude SARS-CoV-2 infection, do not rule out co-infections with other pathogens, and should not be used as the sole basis for treatment or other patient management decisions. Negative results must be combined with clinical observations, patient history, and epidemiological information. The expected result is Negative. Fact Sheet for Patients: HairSlick.no Fact Sheet for Healthcare Providers: quierodirigir.com This test is not yet approved or cleared by the [place] FDA and  has been authorized for detection and/or diagnosis of SARS-CoV-2 by FDA under an Emergency Use Authorization (EUA). This EUA will remain  in effect (meaning this test can be used) for the duration of the COVID-19 declaration under Section 56 4(b)(1) of the Act, 21 U.S.C. section 360bbb-3(b)(1), unless the authorization is terminated or revoked sooner. Performed at [facility], [address], [place], [place] [address]          Radiology Studies: DG Forearm Left  Result Date: 06/13/2019 CLINICAL DATA:  Left forearm pain. EXAM: LEFT FOREARM - 2 VIEW COMPARISON:  None. FINDINGS: There is no evidence of fracture or other focal bone lesions. Wrist and elbow alignment are maintained. There are vascular calcifications. Soft tissues are unremarkable. IMPRESSION: Negative radiographs of the left forearm. Electronically Signed   By: Narda Rutherford M.D.   On: 06/13/2019 13:55   CT Head Wo Contrast  Result Date: 06/13/2019 CLINICAL DATA:  Patient from [facility] assisted living d/t episodes of  near syncope. with history of dementia, pulmonary embolus , CHF and hysterectomy. EXAM: CT HEAD WITHOUT CONTRAST CT CERVICAL SPINE WITHOUT CONTRAST TECHNIQUE: Multidetector CT imaging of the head and cervical spine was performed following the standard protocol without intravenous contrast. Multiplanar CT image reconstructions of the cervical spine were also generated. COMPARISON:  04/12/2019 FINDINGS: CT HEAD FINDINGS Brain: No evidence of acute infarction, hemorrhage, hydrocephalus, extra-axial collection or mass lesion/mass effect. There is ventricular sulcal enlargement reflecting moderate diffuse atrophy. Old lacunar infarcts are noted in the thalami and left caudate nucleus head. Patchy white matter hypoattenuation is present bilaterally consistent with moderate chronic microvascular ischemic change. Vascular: No hyperdense vessel or unexpected calcification. Skull: Normal. Negative for fracture or focal lesion. Sinuses/Orbits: Visualized globes and orbits  are unremarkable. The visualized sinuses and mastoid air cells are clear. Other: None. CT CERVICAL SPINE FINDINGS Alignment: Normal. Skull base and vertebrae: No acute fracture. No primary bone lesion or focal pathologic process. Soft tissues and spinal canal: No prevertebral fluid or swelling. No visible canal hematoma. Disc levels: Mild loss of disc height at C4-C5 and C5-C6 with moderate to marked loss of disc height at C6-C7 and moderate loss of disc height at C7-T1. Facet degenerative changes are noted most prominently on the left at C3-C4. No significant disc bulging and no disc herniation. Upper chest: Unremarkable.  Lung apices are clear. Other: None. IMPRESSION: HEAD CT 1. No acute intracranial abnormalities. 2. Atrophy, chronic microvascular ischemic change and old lacunar infarcts, stable from the prior head CT. CERVICAL CT 1. No fracture or acute finding. Electronically Signed   By: Amie Portland M.D.   On: 06/13/2019 15:41   CT Angio Chest PE W  and/or Wo Contrast  Result Date: 06/13/2019 CLINICAL DATA:  Shortness of breath EXAM: CT ANGIOGRAPHY CHEST CT ABDOMEN AND PELVIS WITH CONTRAST TECHNIQUE: Multidetector CT imaging of the chest was performed using the standard protocol during bolus administration of intravenous contrast. Multiplanar CT image reconstructions and MIPs were obtained to evaluate the vascular anatomy. Multidetector CT imaging of the abdomen and pelvis was performed using the standard protocol during bolus administration of intravenous contrast. CONTRAST:  75mL OMNIPAQUE IOHEXOL 350 MG/ML SOLN COMPARISON:  None. FINDINGS: CTA CHEST FINDINGS Cardiovascular: There are advanced atherosclerotic changes throughout the thoracic aorta. 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 There is no evidence for a thoracic aortic dissection. The main pulmonary artery is mildly dilated measuring approximately 3.3 cm in diameter. There is no acute pulmonary embolism. Residual nonocclusive thrombus is noted within the main right pulmonary artery (axial series 3, image 121). The heart size is borderline enlarged. There is no significant pericardial effusion. Mediastinum/Nodes: --No mediastinal or hilar lymphadenopathy. --No axillary lymphadenopathy. --No supraclavicular lymphadenopathy. --Normal thyroid gland. --The esophagus is unremarkable Lungs/Pleura: There is a pulmonary nodule in the right middle lobe measuring approximately 1.2 cm (axial series 4, image 49). This is stable since prior study in February. The trachea is unremarkable. Mild emphysematous changes are noted. There is no significant pleural effusion. No focal infiltrate. Musculoskeletal: No chest wall abnormality. No acute or significant osseous findings. Review of the MIP images confirms the above findings. CT ABDOMEN and PELVIS FINDINGS Hepatobiliary: The liver is normal. Cholelithiasis without acute inflammation.There is no biliary ductal dilation. Pancreas: Normal contours  without ductal dilatation. No peripancreatic fluid collection. Spleen: No splenic laceration or hematoma. Adrenals/Urinary Tract: --Adrenal glands: No adrenal hemorrhage. --Right kidney/ureter: No hydronephrosis or perinephric hematoma. --Left kidney/ureter: There are multiple left-sided peripelvic cysts. There is no evidence for obstructing left-sided stone. --Urinary bladder: There is mild diffuse bladder wall thickening with adjacent inflammatory changes. Stomach/Bowel: --Stomach/Duodenum: No hiatal hernia or other gastric abnormality. Normal duodenal course and caliber. --Small bowel: No dilatation or inflammation. --Colon: Rectosigmoid diverticulosis without acute inflammation. --Appendix: Not visualized. No right lower quadrant inflammation or free fluid. Vascular/Lymphatic: Atherosclerotic calcification is present within the non-aneurysmal abdominal aorta, without hemodynamically significant stenosis. Findings are suspicious for right common femoral DVT and right superficial femoral vein DVT. There is a well-positioned IVC filter in place. --No retroperitoneal lymphadenopathy. --No mesenteric lymphadenopathy. --No pelvic or inguinal lymphadenopathy. Reproductive: Status post hysterectomy. No adnexal mass. Other: No ascites or free air. The abdominal wall is normal. Musculoskeletal. No acute displaced fractures. Review of the MIP  images confirms the above findings. IMPRESSION: 1. No acute pulmonary embolism. 2. Residual nonocclusive thrombus within the main right pulmonary artery. Overall significant interval improvement in PE burden since prior study dated 05/24/2019. 3. Findings suspicious for right common femoral and right superficial femoral vein DVT. Correlation with ultrasound is recommended. 4. Mild diffuse bladder wall thickening with adjacent inflammatory changes. Correlation with urinalysis is recommended to help exclude underlying cystitis. 5. Cholelithiasis without acute inflammation. 6.  Rectosigmoid diverticulosis without evidence of acute diverticulitis. 7. Right middle lobe pulmonary nodule measuring approximately 1.2 cm, stable since prior study dated 05/24/2019. Consider one of the following in 3 months for both low-risk and high-risk individuals: (a) repeat chest CT, (b) follow-up PET-CT, or (c) tissue sampling. This recommendation follows the consensus statement: Guidelines for Management of Incidental Pulmonary Nodules Detected on CT Images: From the Fleischner Society 2017; Radiology 2017; 284:228-243. 8. Well-positioned IVC filter in place. 9. Moderate stool burden. Aortic Atherosclerosis (ICD10-I70.0). Electronically Signed   By: Katherine Mantlehristopher  Green M.D.   On: 06/13/2019 15:57   CT Cervical Spine Wo Contrast  Result Date: 06/13/2019 CLINICAL DATA:  Patient from [facility] assisted living d/t episodes of near syncope. with history of dementia, pulmonary embolus , CHF and hysterectomy. EXAM: CT HEAD WITHOUT CONTRAST CT CERVICAL SPINE WITHOUT CONTRAST TECHNIQUE: Multidetector CT imaging of the head and cervical spine was performed following the standard protocol without intravenous contrast. Multiplanar CT image reconstructions of the cervical spine were also generated. COMPARISON:  04/12/2019 FINDINGS: CT HEAD FINDINGS Brain: No evidence of acute infarction, hemorrhage, hydrocephalus, extra-axial collection or mass lesion/mass effect. There is ventricular sulcal enlargement reflecting moderate diffuse atrophy. Old lacunar infarcts are noted in the thalami and left caudate nucleus head. Patchy white matter hypoattenuation is present bilaterally consistent with moderate chronic microvascular ischemic change. Vascular: No hyperdense vessel or unexpected calcification. Skull: Normal. Negative for fracture or focal lesion. Sinuses/Orbits: Visualized globes and orbits are unremarkable. The visualized sinuses and mastoid air cells are clear. Other: None. CT CERVICAL SPINE FINDINGS  Alignment: Normal. Skull base and vertebrae: No acute fracture. No primary bone lesion or focal pathologic process. Soft tissues and spinal canal: No prevertebral fluid or swelling. No visible canal hematoma. Disc levels: Mild loss of disc height at C4-C5 and C5-C6 with moderate to marked loss of disc height at C6-C7 and moderate loss of disc height at C7-T1. Facet degenerative changes are noted most prominently on the left at C3-C4. No significant disc bulging and no disc herniation. Upper chest: Unremarkable.  Lung apices are clear. Other: None. IMPRESSION: HEAD CT 1. No acute intracranial abnormalities. 2. Atrophy, chronic microvascular ischemic change and old lacunar infarcts, stable from the prior head CT. CERVICAL CT 1. No fracture or acute finding. Electronically Signed   By: Amie Portlandavid  Ormond M.D.   On: 06/13/2019 15:41   CT ABDOMEN PELVIS W CONTRAST  Result Date: 06/13/2019 CLINICAL DATA:  Shortness of breath EXAM: CT ANGIOGRAPHY CHEST CT ABDOMEN AND PELVIS WITH CONTRAST TECHNIQUE: Multidetector CT imaging of the chest was performed using the standard protocol during bolus administration of intravenous contrast. Multiplanar CT image reconstructions and MIPs were obtained to evaluate the vascular anatomy. Multidetector CT imaging of the abdomen and pelvis was performed using the standard protocol during bolus administration of intravenous contrast. CONTRAST:  75mL OMNIPAQUE IOHEXOL 350 MG/ML SOLN COMPARISON:  None. FINDINGS: CTA CHEST FINDINGS Cardiovascular: There are advanced atherosclerotic changes throughout the thoracic aorta. 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 There is no evidence  for a thoracic aortic dissection. The main pulmonary artery is mildly dilated measuring approximately 3.3 cm in diameter. There is no acute pulmonary embolism. Residual nonocclusive thrombus is noted within the main right pulmonary artery (axial series 3, image 121). The heart size is borderline  enlarged. There is no significant pericardial effusion. Mediastinum/Nodes: --No mediastinal or hilar lymphadenopathy. --No axillary lymphadenopathy. --No supraclavicular lymphadenopathy. --Normal thyroid gland. --The esophagus is unremarkable Lungs/Pleura: There is a pulmonary nodule in the right middle lobe measuring approximately 1.2 cm (axial series 4, image 49). This is stable since prior study in February. The trachea is unremarkable. Mild emphysematous changes are noted. There is no significant pleural effusion. No focal infiltrate. Musculoskeletal: No chest wall abnormality. No acute or significant osseous findings. Review of the MIP images confirms the above findings. CT ABDOMEN and PELVIS FINDINGS Hepatobiliary: The liver is normal. Cholelithiasis without acute inflammation.There is no biliary ductal dilation. Pancreas: Normal contours without ductal dilatation. No peripancreatic fluid collection. Spleen: No splenic laceration or hematoma. Adrenals/Urinary Tract: --Adrenal glands: No adrenal hemorrhage. --Right kidney/ureter: No hydronephrosis or perinephric hematoma. --Left kidney/ureter: There are multiple left-sided peripelvic cysts. There is no evidence for obstructing left-sided stone. --Urinary bladder: There is mild diffuse bladder wall thickening with adjacent inflammatory changes. Stomach/Bowel: --Stomach/Duodenum: No hiatal hernia or other gastric abnormality. Normal duodenal course and caliber. --Small bowel: No dilatation or inflammation. --Colon: Rectosigmoid diverticulosis without acute inflammation. --Appendix: Not visualized. No right lower quadrant inflammation or free fluid. Vascular/Lymphatic: Atherosclerotic calcification is present within the non-aneurysmal abdominal aorta, without hemodynamically significant stenosis. Findings are suspicious for right common femoral DVT and right superficial femoral vein DVT. There is a well-positioned IVC filter in place. --No retroperitoneal  lymphadenopathy. --No mesenteric lymphadenopathy. --No pelvic or inguinal lymphadenopathy. Reproductive: Status post hysterectomy. No adnexal mass. Other: No ascites or free air. The abdominal wall is normal. Musculoskeletal. No acute displaced fractures. Review of the MIP images confirms the above findings. IMPRESSION: 1. No acute pulmonary embolism. 2. Residual nonocclusive thrombus within the main right pulmonary artery. Overall significant interval improvement in PE burden since prior study dated 05/24/2019. 3. Findings suspicious for right common femoral and right superficial femoral vein DVT. Correlation with ultrasound is recommended. 4. Mild diffuse bladder wall thickening with adjacent inflammatory changes. Correlation with urinalysis is recommended to help exclude underlying cystitis. 5. Cholelithiasis without acute inflammation. 6. Rectosigmoid diverticulosis without evidence of acute diverticulitis. 7. Right middle lobe pulmonary nodule measuring approximately 1.2 cm, stable since prior study dated 05/24/2019. Consider one of the following in 3 months for both low-risk and high-risk individuals: (a) repeat chest CT, (b) follow-up PET-CT, or (c) tissue sampling. This recommendation follows the consensus statement: Guidelines for Management of Incidental Pulmonary Nodules Detected on CT Images: From the Fleischner Society 2017; Radiology 2017; 284:228-243. 8. Well-positioned IVC filter in place. 9. Moderate stool burden. Aortic Atherosclerosis (ICD10-I70.0). Electronically Signed   By: Katherine Mantle M.D.   On: 06/13/2019 15:57   US Venous Img Lower Bilateral (DVT)  Result Date: 06/13/2019 CLINICAL DATA:  86 year old female with bilateral leg edema. EXAM: BILATERAL LOWER EXTREMITY VENOUS DOPPLER ULTRASOUND TECHNIQUE: Gray-scale sonography with graded compression, as well as color Doppler and duplex ultrasound were performed to evaluate the lower extremity deep venous systems from the level of the  common femoral vein and including the common femoral, femoral, profunda femoral, popliteal and calf veins including the posterior tibial, peroneal and gastrocnemius veins when visible. The superficial great saphenous vein was also interrogated. Spectral Doppler was utilized  to evaluate flow at rest and with distal augmentation maneuvers in the common femoral, femoral and popliteal veins. COMPARISON:  None. FINDINGS: RIGHT LOWER EXTREMITY Common Femoral Vein: Large occlusive thrombus in the common femoral vein. Saphenofemoral Junction: The visualized portion of the saphenous vein at the saphenofemoral junction appears partially patent. There is large thrombus at the saphenofemoral junction. Profunda Femoral Vein: Occlusive thrombus in the profundus femoris. Femoral Vein: Occlusive thrombus in the entire femoral vein with non compressibility. Popliteal Vein: There is occlusive thrombus in the popliteal vein. Calf Veins: There is extensive thrombus involving the calf veins. Superficial Great Saphenous Vein: Not visualized. Venous Reflux:  None. Other Findings:  None. LEFT LOWER EXTREMITY Common Femoral Vein: There is occlusive thrombus in the left common femoral vein with none compression of the vessel. Saphenofemoral Junction: Occlusive thrombus in the femoral vein extending into the saphenous vein. Profunda Femoral Vein: Occlusive thrombus within the profundus femoris vein. Femoral Vein: There is occlusive thrombus with noncompressibility of the vessel. Popliteal Vein: Occlusive thrombus with noncompressibility of the vessels. Calf Veins: The visualized calf veins appear patent. Superficial Great Saphenous Vein: Occlusive thrombus in the visualized great saphenous vein. Venous Reflux:  None. Other Findings:  None. IMPRESSION: Extensive bilateral lower extremity occlusive DVT with the visualized proximal extension to the common femoral vein bilaterally. These results were called by telephone at the time of  interpretation on 06/13/2019 at 9:26 pm to Dr. Scotty Court who verbally acknowledged these results. Electronically Signed   By: Elgie Collard M.D.   On: 06/13/2019 21:27        Scheduled Meds: . calcium-vitamin D  1 tablet Oral BID  . citalopram  10 mg Oral Daily  . enoxaparin (LOVENOX) injection  40 mg Subcutaneous Q24H  . feeding supplement (ENSURE ENLIVE)  237 mL Oral BID BM  . furosemide  20 mg Oral Daily  . insulin aspart  0-9 Units Subcutaneous TID WC  . multivitamin with minerals  1 tablet Oral Daily  . pravastatin  40 mg Oral q1800  . sodium chloride  1 spray Each Nare TID   Continuous Infusions: . sodium chloride 100 mL/hr at 06/14/19 0947  . cefTRIAXone (ROCEPHIN)  IV       LOS: 0 days    Time spent:    Erick Blinks, MD [facility]   If 7PM-7AM, please contact night-coverage [email or web address]  06/14/2019, 10:26 AM

## 2019-06-14 NOTE — ED Notes (Signed)
Report given to inpatient RN, transportation requested. Daughter updated and inpatient visitor policy explained. Pt is asleep at this time.

## 2019-06-14 NOTE — Progress Notes (Signed)
[facility] Daily Progress Note   Subjective: 05/27/19: 1. Ultrasound guidance for vascular access to the right femoralvein 2. Catheter placement into the inferior vena cava 3. Inferior venacavogram 4. Placement of a Bard DenaliIVC filter  The patient is an 85 year old female well-known to our service after being diagnosed with DVT/PE placed on heparin with subsequent GI bleed.  IVC filter was placed on May 27, 2019 our service.  Patient presents back to the [facility] emergency department yesterday with confusion.  Vascular surgery was consulted by Dr. York Ram for bilateral lower extremity edema.  Objective: Vitals:   06/14/19 1330 06/14/19 1400 06/14/19 1430 06/14/19 1500  BP: 102/90 120/63 115/69 110/70  Pulse:   (!) 48 77  Resp: 16 (!) 23 (!) 22 20  Temp:      TempSrc:      SpO2:   97% 97%  Weight:      Height:        Intake/Output Summary (Last 24 hours) at 06/14/2019 1533 Last data filed at 06/14/2019 1515 Gross per 24 hour  Intake 860 ml  Output 600 ml  Net 260 ml   Physical Exam: A&Ox2, NAD CV: RRR Pulmonary: CTA Bilaterally, respirations nonlabored Abdomen: Soft, Nontender, Nondistended Vascular: Right lower extremity: Thigh soft, calf soft.  Extremities warm distally in toes.  Mild to moderate edema however the extremity is extremely soft.  There is no acute vascular compromise noted to the extremity at this time.  There is no pain to palpation.  There is no pain with dorsiflexion.  Skin is intact.  No erythema.  Left lower extremity: Thigh soft, calf soft.  Extremities warm distally in toes.  Mild to moderate edema however the extremity is extremely soft.  There is no acute vascular compromise noted to the extremity at this time.  There is no pain to palpation.  There is no pain with dorsiflexion.  Skin is intact.  No erythema.   Laboratory: CBC    Component Value Date/Time   WBC 6.2 06/14/2019 0441   HGB  7.1 (L) 06/14/2019 0441   HCT 22.3 (L) 06/14/2019 0441   PLT 153 06/14/2019 0441   BMET    Component Value Date/Time   NA 139 06/14/2019 0441   K 3.3 (L) 06/14/2019 0441   CL 105 06/14/2019 0441   CO2 28 06/14/2019 0441   GLUCOSE 92 06/14/2019 0441   BUN 15 06/14/2019 0441   CREATININE 1.19 (H) 06/14/2019 0441   CALCIUM 7.7 (L) 06/14/2019 0441   GFRNONAA 42 (L) 06/14/2019 0441   GFRAA 48 (L) 06/14/2019 0441   Assessment/Planning: The patient is an 86 year old female well-known to our service after being diagnosed with DVT/PE placed on heparin with subsequent GI bleed.  IVC filter was placed on May 27, 2019 our service.  1) Bilateral Lower Extremity Edema: There is no acute vascular compromise noted to the bilateral lower extremity at this time.  Bilateral legs with mild to moderate edema however is soft, without erythema, without pain and skin is intact.  Venous duplex with extensive DVT which is known and expected.  Patient has IVC filter in the setting of contraindication of oral anticoagulation due to GI bleed.  Recommend elevation.  No further recommendations from vascular surgery at this time.  2) Diabetes: On appropriate medications. Encouraged good control as its slows the progression of atherosclerotic disease  Discussed with Dr. Charlie Pitter Snyder Colavito PA-C 06/14/2019 3:33 PM

## 2019-06-15 LAB — COMPREHENSIVE METABOLIC PANEL
ALT: 8 U/L (ref 0–44)
AST: 14 U/L — ABNORMAL LOW (ref 15–41)
Albumin: 2.8 g/dL — ABNORMAL LOW (ref 3.5–5.0)
Alkaline Phosphatase: 48 U/L (ref 38–126)
Anion gap: 8 (ref 5–15)
BUN: 14 mg/dL (ref 8–23)
CO2: 24 mmol/L (ref 22–32)
Calcium: 7.3 mg/dL — ABNORMAL LOW (ref 8.9–10.3)
Chloride: 109 mmol/L (ref 98–111)
Creatinine, Ser: 1.18 mg/dL — ABNORMAL HIGH (ref 0.44–1.00)
GFR calc Af Amer: 49 mL/min — ABNORMAL LOW (ref >60)
GFR calc non Af Amer: 42 mL/min — ABNORMAL LOW (ref >60)
Glucose, Bld: 97 mg/dL (ref 70–99)
Potassium: 3.3 mmol/L — ABNORMAL LOW (ref 3.5–5.1)
Sodium: 141 mmol/L (ref 135–145)
Total Bilirubin: 1.1 mg/dL (ref 0.3–1.2)
Total Protein: 5.7 g/dL — ABNORMAL LOW (ref 6.5–8.1)

## 2019-06-15 LAB — CBC
HCT: 27.4 % — ABNORMAL LOW (ref 36.0–46.0)
Hemoglobin: 8.8 g/dL — ABNORMAL LOW (ref 12.0–15.0)
MCH: 27.8 pg (ref 26.0–34.0)
MCHC: 32.1 g/dL (ref 30.0–36.0)
MCV: 86.7 fL (ref 80.0–100.0)
Platelets: 173 10*3/uL (ref 150–400)
RBC: 3.16 MIL/uL — ABNORMAL LOW (ref 3.87–5.11)
RDW: 14.6 % (ref 11.5–15.5)
WBC: 7.8 10*3/uL (ref 4.0–10.5)
nRBC: 0 % (ref 0.0–0.2)

## 2019-06-15 LAB — URINE CULTURE: Culture: 80000 — AB

## 2019-06-15 LAB — TYPE AND SCREEN
ABO/RH(D): A POS
Antibody Screen: NEGATIVE
Unit division: 0

## 2019-06-15 LAB — BPAM RBC
Blood Product Expiration Date: 202104162359
ISSUE DATE / TIME: 202103191145
Unit Type and Rh: 6200

## 2019-06-15 LAB — GLUCOSE, CAPILLARY
Glucose-Capillary: 82 mg/dL (ref 70–99)
Glucose-Capillary: 90 mg/dL (ref 70–99)

## 2019-06-15 MED ORDER — CEFDINIR 300 MG PO CAPS: 300.0000 mg | ORAL_CAPSULE | Freq: Two times a day (BID) | ORAL | 0 refills | Status: AC

## 2019-06-15 MED ORDER — CEFDINIR 300 MG PO CAPS
300.0000 mg | ORAL_CAPSULE | Freq: Two times a day (BID) | ORAL | Status: DC
  Administered 2019-06-15: 300 mg via ORAL
  Filled 2019-06-15 (×2): qty 1

## 2019-06-15 NOTE — Progress Notes (Signed)
D: Pt alert and at baseline. Pt denies experiencing any pain at this time.   A: Pt and daughter received discharge and medication education/information. Pt belongings were gathered and taken with pt upon discharge to include one prescription.   R: Daughter verbalized understanding of discharge and medication education/information.  Pt and daughter escorted to medical mall front lobby by staff via wheelchair where family picked them up.

## 2019-06-15 NOTE — Discharge Summary (Signed)
Physician Discharge Summary  Jasmine Hubbard LZJ:[id number] DOB: 09-06-1931 DOA: 06/13/2019  PCP: [name], MD  Admit date: 06/13/2019 Discharge date: 06/15/2019  Admitted From: Assisted living facility, [facility] Disposition: Return to [facility]  Recommendations for Outpatient Follow-up:  1. Follow up with PCP in 1-2 weeks 2.   Home Health: No Equipment/Devices: None Discharge Condition: Stable CODE STATUS: Full Diet recommendation: Dysphagia   Brief/Interim Summary: 86 year old female with history of dementia, chronic kidney disease stage III, VTE recent admission for pulmonary embolus.  She was treated with anticoagulation, but hospitalization was complicated by development of GI bleeding.  IVC filter was placed and anticoagulation was discontinued.  She return to assisted living facility where she was noted to be increasingly somnolent yesterday.  She was brought to the hospital for evaluation where she was noted to be mildly dehydrated, urinalysis indicated urinary tract infection.  She was started on antibiotics.  She was also noted to have worsening anemia.  No evidence of bleeding at this time.  She was also found to have bilateral lower extremity DVTs.  Vascular surgery was consulted.  3/20: Vascular surgery note appreciated.  No indication for any intervention from their standpoint.  Urine culture results and sensitivities followed.  Sensitive to second and third generation cephalosporins.  IV Rocephin discontinued.  P.o. cefdinir to be continued to complete a total 5-day antibiotic course for uncomplicated cystitis.  Electing to pursue slightly longer antibiotic course considering patient's limited functional status.  Hemoglobin stable after transfusion.  No sources of bleeding.  Lengthy conversation with the patient's daughter at bedside today.  Explained and updated overall plan of care.  Patient's daughter states that the patient has had functional decline  over the past several months.  Introduced the idea of hospice care.  Patient's daughter states that she will have [facility] look into it.  Also discussed CODE STATUS.  Explained that in current state patient will likely not survive a code event.  Patient seen his daughter expressed understanding and agreed to discuss with other family members and her parents primary care physician.   Discharge Diagnoses:  Principal Problem:   Acute metabolic encephalopathy Active Problems:   Dementia (HCC)   Diabetes (HCC)   Chronic diastolic CHF (congestive heart failure) (HCC)   UTI (urinary tract infection)   CKD (chronic kidney disease), stage III   AKI (acute kidney injury) (HCC)   Anemia   History of pulmonary embolus (PE)   Bilateral lower extremity edema   S/P insertion of IVC (inferior vena caval) filter   Hypotension   Acute encephalopathy  1. Acute metabolic encephalopathy, superimposed on baseline dementia, likely precipitated by urinary tract infection.    Mental status improved after initiation of antibiotics 2. Urinary tract infection.    Intravenous ceftriaxone in house.  Transition to p.o. cefdinir on discharge  3. acute kidney injury on chronic kidney disease stage III.  Likely related to dehydration improving with IV fluid 4. Acute on chronic anemia.  Hemoglobin has trended down over the last several weeks.  She has not had any ongoing issues with bleeding.  Since patient has a history of vascular disease with PE/right heart strain, will try to keep hemoglobin goal greater than 8.  Hemoglobin has trended down to 7.1, will transfuse 1 unit of PRBC.    Hemoglobin responded appropriately.  Outpatient follow-up 5. Acute bilateral lower extremity occlusive DVT, with history of recurrent pulmonary embolus.  During her last admission, patient was admitted for pulmonary embolus  with right heart strain.  He was initially placed on anticoagulation, but this had to be discontinued due to  development of GI bleeding.  IVC filter was placed on 3/1 by vascular surgery.  Venous Dopplers done on this admission showed bilateral DVTs.  Unclear if this was present during her last admission since she did not have Doppler studies done at that time.  With IVC filter in place, I am unsure if there is any further interventions to offer.  Vascular surgery consulted to see if any further interventions are indicated.  She does not appear to be a candidate for anticoagulation due to history of recent GI bleeding.  Repeat CT angiogram of her chest shows improving clot burden from pulmonary embolus.  Reviewed results with patient's daughter.  No intervention indicated at this time.  Patient on room air, not tachycardic.  Outpatient follow-up 6. Dementia.  Family reports that patient is normally able to recognize other family members.  She has to be reoriented at times to her surroundings.  She is able to feed herself and can occasionally ambulate with a walker.  Per the daughter at bedside patient has intermittent moments of lucidity.  Recommend consideration for outpatient palliative care versus hospice.  Patient's daughter expressed understanding agrees to do so 7. Chronic diastolic congestive heart failure.  Appears compensated at this time. 8. Diabetes.  Currently on sliding scale insulin.  Continue to follow blood sugars.  Can resume home regimen on discharge 9. Right middle lobe pulmonary nodule.  Noted on CT imaging.  Will need repeat CT chest in 3 months for surveillance. 10. Goals of care.    Patient's daughter elected for full CODE STATUS earlier in the hospitalization.  I had another discussion with the patient's daughter at bedside today.  I recommended this patient be a DNR as a code event will likely not be survivable given the patient's current physical state.  Patient's daughter expresses understanding.  Agrees to discuss with patient's primary care physician and her sibling.  Discharge  Instructions  Discharge Instructions    Diet - low sodium heart healthy   Complete by: As directed    Increase activity slowly   Complete by: As directed      Allergies as of 06/15/2019      Reactions   Naproxen Hives, Shortness Of Breath      Medication List    STOP taking these medications   insulin aspart 100 UNIT/ML injection Commonly known as: novoLOG     TAKE these medications   acetaminophen 325 MG tablet Commonly known as: TYLENOL Take 650 mg by mouth every 6 (six) hours as needed for mild pain.   acidophilus Caps capsule Take 1 capsule by mouth daily.   alendronate 70 MG tablet Commonly known as: FOSAMAX Take 70 mg by mouth every Friday. Take with a full glass of water on an empty stomach.   Calcium Carb-Cholecalciferol 600-400 MG-UNIT Tabs Take 1 tablet by mouth 2 (two) times a day.   cefdinir 300 MG capsule Commonly known as: OMNICEF Take 1 capsule (300 mg total) by mouth every 12 (twelve) hours for 3 days.   citalopram 10 MG tablet Commonly known as: CELEXA Take 10 mg by mouth daily.   feeding supplement (ENSURE ENLIVE) Liqd Take 237 mLs by mouth 2 (two) times daily between meals.   furosemide 20 MG tablet Commonly known as: LASIX Take 20 mg by mouth daily.   guaifenesin 100 MG/5ML syrup Commonly known as: ROBITUSSIN Take 200 mg  by mouth every 6 (six) hours as needed for cough or congestion. (max 4 doses daily)   loperamide 2 MG tablet Commonly known as: Imodium A-D Take 1 tablet (2 mg total) by mouth 4 (four) times daily as needed for diarrhea or loose stools.   lovastatin 20 MG tablet Commonly known as: MEVACOR Take 40 mg by mouth at bedtime.   magnesium hydroxide 400 MG/5ML suspension Commonly known as: MILK OF MAGNESIA Take 30 mLs by mouth at bedtime as needed for mild constipation.   Mintox 200-200-20 MG/5ML suspension Generic drug: alum & mag hydroxide-simeth Take 30 mLs by mouth every 6 (six) hours as needed for indigestion or  heartburn.   multivitamin with minerals Tabs tablet Take 1 tablet by mouth daily.   neomycin-bacitracin-polymyxin Oint Commonly known as: NEOSPORIN Apply 1 application topically 4 (four) times daily as needed for wound care.   Probiotic 250 MG Caps Take 1 each by mouth 2 (two) times daily.   sodium chloride 0.65 % Soln nasal spray Commonly known as: OCEAN Place 1 spray into both nostrils 3 (three) times daily.       Allergies  Allergen Reactions  . Naproxen Hives and Shortness Of Breath    Consultations:  none   Procedures/Studies: DG Chest 1 View  Result Date: 05/24/2019 CLINICAL DATA:  Syncope EXAM: CHEST  1 VIEW COMPARISON:  CT same day, April 12, 2019 FINDINGS: The heart size and mediastinal contours are within normal limits. Aortic knob calcifications. Both lungs are clear. The visualized skeletal structures are unremarkable. IMPRESSION: No active disease. Electronically Signed   By: Jonna Clark M.D.   On: 05/24/2019 22:22   DG Forearm Left  Result Date: 06/13/2019 CLINICAL DATA:  Left forearm pain. EXAM: LEFT FOREARM - 2 VIEW COMPARISON:  None. FINDINGS: There is no evidence of fracture or other focal bone lesions. Wrist and elbow alignment are maintained. There are vascular calcifications. Soft tissues are unremarkable. IMPRESSION: Negative radiographs of the left forearm. Electronically Signed   By: Narda Rutherford M.D.   On: 06/13/2019 13:55   CT Head Wo Contrast  Result Date: 06/13/2019 CLINICAL DATA:  Patient from [facility] assisted living d/t episodes of near syncope. with history of dementia, pulmonary embolus , CHF and hysterectomy. EXAM: CT HEAD WITHOUT CONTRAST CT CERVICAL SPINE WITHOUT CONTRAST TECHNIQUE: Multidetector CT imaging of the head and cervical spine was performed following the standard protocol without intravenous contrast. Multiplanar CT image reconstructions of the cervical spine were also generated. COMPARISON:  04/12/2019 FINDINGS: CT  HEAD FINDINGS Brain: No evidence of acute infarction, hemorrhage, hydrocephalus, extra-axial collection or mass lesion/mass effect. There is ventricular sulcal enlargement reflecting moderate diffuse atrophy. Old lacunar infarcts are noted in the thalami and left caudate nucleus head. Patchy white matter hypoattenuation is present bilaterally consistent with moderate chronic microvascular ischemic change. Vascular: No hyperdense vessel or unexpected calcification. Skull: Normal. Negative for fracture or focal lesion. Sinuses/Orbits: Visualized globes and orbits are unremarkable. The visualized sinuses and mastoid air cells are clear. Other: None. CT CERVICAL SPINE FINDINGS Alignment: Normal. Skull base and vertebrae: No acute fracture. No primary bone lesion or focal pathologic process. Soft tissues and spinal canal: No prevertebral fluid or swelling. No visible canal hematoma. Disc levels: Mild loss of disc height at C4-C5 and C5-C6 with moderate to marked loss of disc height at C6-C7 and moderate loss of disc height at C7-T1. Facet degenerative changes are noted most prominently on the left at C3-C4. No significant disc bulging and no  disc herniation. Upper chest: Unremarkable.  Lung apices are clear. Other: None. IMPRESSION: HEAD CT 1. No acute intracranial abnormalities. 2. Atrophy, chronic microvascular ischemic change and old lacunar infarcts, stable from the prior head CT. CERVICAL CT 1. No fracture or acute finding. Electronically Signed   By: Amie Portland M.D.   On: 06/13/2019 15:41   CT Angio Chest PE W and/or Wo Contrast  Result Date: 06/13/2019 CLINICAL DATA:  Shortness of breath EXAM: CT ANGIOGRAPHY CHEST CT ABDOMEN AND PELVIS WITH CONTRAST TECHNIQUE: Multidetector CT imaging of the chest was performed using the standard protocol during bolus administration of intravenous contrast. Multiplanar CT image reconstructions and MIPs were obtained to evaluate the vascular anatomy. Multidetector CT imaging  of the abdomen and pelvis was performed using the standard protocol during bolus administration of intravenous contrast. CONTRAST:  12mL OMNIPAQUE IOHEXOL 350 MG/ML SOLN COMPARISON:  None. FINDINGS: CTA CHEST FINDINGS Cardiovascular: There are advanced atherosclerotic changes throughout the thoracic aorta. 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 There is no evidence for a thoracic aortic dissection. The main pulmonary artery is mildly dilated measuring approximately 3.3 cm in diameter. There is no acute pulmonary embolism. Residual nonocclusive thrombus is noted within the main right pulmonary artery (axial series 3, image 121). The heart size is borderline enlarged. There is no significant pericardial effusion. Mediastinum/Nodes: --No mediastinal or hilar lymphadenopathy. --No axillary lymphadenopathy. --No supraclavicular lymphadenopathy. --Normal thyroid gland. --The esophagus is unremarkable Lungs/Pleura: There is a pulmonary nodule in the right middle lobe measuring approximately 1.2 cm (axial series 4, image 49). This is stable since prior study in February. The trachea is unremarkable. Mild emphysematous changes are noted. There is no significant pleural effusion. No focal infiltrate. Musculoskeletal: No chest wall abnormality. No acute or significant osseous findings. Review of the MIP images confirms the above findings. CT ABDOMEN and PELVIS FINDINGS Hepatobiliary: The liver is normal. Cholelithiasis without acute inflammation.There is no biliary ductal dilation. Pancreas: Normal contours without ductal dilatation. No peripancreatic fluid collection. Spleen: No splenic laceration or hematoma. Adrenals/Urinary Tract: --Adrenal glands: No adrenal hemorrhage. --Right kidney/ureter: No hydronephrosis or perinephric hematoma. --Left kidney/ureter: There are multiple left-sided peripelvic cysts. There is no evidence for obstructing left-sided stone. --Urinary bladder: There is mild diffuse  bladder wall thickening with adjacent inflammatory changes. Stomach/Bowel: --Stomach/Duodenum: No hiatal hernia or other gastric abnormality. Normal duodenal course and caliber. --Small bowel: No dilatation or inflammation. --Colon: Rectosigmoid diverticulosis without acute inflammation. --Appendix: Not visualized. No right lower quadrant inflammation or free fluid. Vascular/Lymphatic: Atherosclerotic calcification is present within the non-aneurysmal abdominal aorta, without hemodynamically significant stenosis. Findings are suspicious for right common femoral DVT and right superficial femoral vein DVT. There is a well-positioned IVC filter in place. --No retroperitoneal lymphadenopathy. --No mesenteric lymphadenopathy. --No pelvic or inguinal lymphadenopathy. Reproductive: Status post hysterectomy. No adnexal mass. Other: No ascites or free air. The abdominal wall is normal. Musculoskeletal. No acute displaced fractures. Review of the MIP images confirms the above findings. IMPRESSION: 1. No acute pulmonary embolism. 2. Residual nonocclusive thrombus within the main right pulmonary artery. Overall significant interval improvement in PE burden since prior study dated 05/24/2019. 3. Findings suspicious for right common femoral and right superficial femoral vein DVT. Correlation with ultrasound is recommended. 4. Mild diffuse bladder wall thickening with adjacent inflammatory changes. Correlation with urinalysis is recommended to help exclude underlying cystitis. 5. Cholelithiasis without acute inflammation. 6. Rectosigmoid diverticulosis without evidence of acute diverticulitis. 7. Right middle lobe pulmonary nodule measuring approximately 1.2 cm, stable  since prior study dated 05/24/2019. Consider one of the following in 3 months for both low-risk and high-risk individuals: (a) repeat chest CT, (b) follow-up PET-CT, or (c) tissue sampling. This recommendation follows the consensus statement: Guidelines for  Management of Incidental Pulmonary Nodules Detected on CT Images: From the Fleischner Society 2017; Radiology 2017; 284:228-243. 8. Well-positioned IVC filter in place. 9. Moderate stool burden. Aortic Atherosclerosis (ICD10-I70.0). Electronically Signed   By: Katherine Mantle M.D.   On: 06/13/2019 15:57   CT Angio Chest PE W/Cm &/Or Wo Cm  Result Date: 05/24/2019 CLINICAL DATA:  Hypoxia, syncope EXAM: CT ANGIOGRAPHY CHEST WITH CONTRAST TECHNIQUE: Multidetector CT imaging of the chest was performed using the standard protocol during bolus administration of intravenous contrast. Multiplanar CT image reconstructions and MIPs were obtained to evaluate the vascular anatomy. CONTRAST:  60mL OMNIPAQUE IOHEXOL 350 MG/ML SOLN COMPARISON:  04/12/2019 FINDINGS: Cardiovascular: This is a technically adequate evaluation of the pulmonary vasculature. Diffuse bilateral pulmonary emboli are seen within all lobar distributions, as well as a large embolus persisting in the right main pulmonary artery. Significant clot burden. Dilated right ventricle with RV/LV ratio measuring 1.6. The heart is not enlarged. Significant atherosclerosis of the thoracic aorta. Aberrant origin of the right subclavian artery is noted, a frequent anatomic variant. No evidence of thoracic aortic aneurysm or dissection. Mediastinum/Nodes: No enlarged mediastinal, hilar, or axillary lymph nodes. Thyroid gland, trachea, and esophagus demonstrate no significant findings. Lungs/Pleura: Mild upper lobe predominant emphysema. No airspace disease, effusion, or pneumothorax. The central airways are patent. Upper Abdomen: No acute abnormality. Musculoskeletal: No acute or destructive bony lesions. Reconstructed images demonstrate no additional findings. Review of the MIP images confirms the above findings. IMPRESSION: 1. Diffuse bilateral pulmonary emboli with CTevidence of right heart strain (RV/LV Ratio = 1.6) consistent with at least submassive  (intermediate risk) PE. The presence of right heart strain has been associated with an increased risk of morbidity and mortality. 2.  Aortic Atherosclerosis (ICD10-I70.0). 3. Emphysema (ICD10-J43.9). These results were called by telephone at the time of interpretation on 05/24/2019 at 9:56 pm to provider [facility] , who verbally acknowledged these results. Electronically Signed   By: Sharlet Salina M.D.   On: 05/24/2019 21:56   CT Cervical Spine Wo Contrast  Result Date: 06/13/2019 CLINICAL DATA:  Patient from [facility] assisted living d/t episodes of near syncope. with history of dementia, pulmonary embolus , CHF and hysterectomy. EXAM: CT HEAD WITHOUT CONTRAST CT CERVICAL SPINE WITHOUT CONTRAST TECHNIQUE: Multidetector CT imaging of the head and cervical spine was performed following the standard protocol without intravenous contrast. Multiplanar CT image reconstructions of the cervical spine were also generated. COMPARISON:  04/12/2019 FINDINGS: CT HEAD FINDINGS Brain: No evidence of acute infarction, hemorrhage, hydrocephalus, extra-axial collection or mass lesion/mass effect. There is ventricular sulcal enlargement reflecting moderate diffuse atrophy. Old lacunar infarcts are noted in the thalami and left caudate nucleus head. Patchy white matter hypoattenuation is present bilaterally consistent with moderate chronic microvascular ischemic change. Vascular: No hyperdense vessel or unexpected calcification. Skull: Normal. Negative for fracture or focal lesion. Sinuses/Orbits: Visualized globes and orbits are unremarkable. The visualized sinuses and mastoid air cells are clear. Other: None. CT CERVICAL SPINE FINDINGS Alignment: Normal. Skull base and vertebrae: No acute fracture. No primary bone lesion or focal pathologic process. Soft tissues and spinal canal: No prevertebral fluid or swelling. No visible canal hematoma. Disc levels: Mild loss of disc height at C4-C5 and C5-C6 with moderate to marked  loss of disc  height at C6-C7 and moderate loss of disc height at C7-T1. Facet degenerative changes are noted most prominently on the left at C3-C4. No significant disc bulging and no disc herniation. Upper chest: Unremarkable.  Lung apices are clear. Other: None. IMPRESSION: HEAD CT 1. No acute intracranial abnormalities. 2. Atrophy, chronic microvascular ischemic change and old lacunar infarcts, stable from the prior head CT. CERVICAL CT 1. No fracture or acute finding. Electronically Signed   By: Lajean Manes M.D.   On: 06/13/2019 15:41   CT ABDOMEN PELVIS W CONTRAST  Result Date: 06/13/2019 CLINICAL DATA:  Shortness of breath EXAM: CT ANGIOGRAPHY CHEST CT ABDOMEN AND PELVIS WITH CONTRAST TECHNIQUE: Multidetector CT imaging of the chest was performed using the standard protocol during bolus administration of intravenous contrast. Multiplanar CT image reconstructions and MIPs were obtained to evaluate the vascular anatomy. Multidetector CT imaging of the abdomen and pelvis was performed using the standard protocol during bolus administration of intravenous contrast. CONTRAST:  63mL OMNIPAQUE IOHEXOL 350 MG/ML SOLN COMPARISON:  None. FINDINGS: CTA CHEST FINDINGS Cardiovascular: There are advanced atherosclerotic changes throughout the thoracic aorta. 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 There is no evidence for a thoracic aortic dissection. The main pulmonary artery is mildly dilated measuring approximately 3.3 cm in diameter. There is no acute pulmonary embolism. Residual nonocclusive thrombus is noted within the main right pulmonary artery (axial series 3, image 121). The heart size is borderline enlarged. There is no significant pericardial effusion. Mediastinum/Nodes: --No mediastinal or hilar lymphadenopathy. --No axillary lymphadenopathy. --No supraclavicular lymphadenopathy. --Normal thyroid gland. --The esophagus is unremarkable Lungs/Pleura: There is a pulmonary nodule in the  right middle lobe measuring approximately 1.2 cm (axial series 4, image 49). This is stable since prior study in February. The trachea is unremarkable. Mild emphysematous changes are noted. There is no significant pleural effusion. No focal infiltrate. Musculoskeletal: No chest wall abnormality. No acute or significant osseous findings. Review of the MIP images confirms the above findings. CT ABDOMEN and PELVIS FINDINGS Hepatobiliary: The liver is normal. Cholelithiasis without acute inflammation.There is no biliary ductal dilation. Pancreas: Normal contours without ductal dilatation. No peripancreatic fluid collection. Spleen: No splenic laceration or hematoma. Adrenals/Urinary Tract: --Adrenal glands: No adrenal hemorrhage. --Right kidney/ureter: No hydronephrosis or perinephric hematoma. --Left kidney/ureter: There are multiple left-sided peripelvic cysts. There is no evidence for obstructing left-sided stone. --Urinary bladder: There is mild diffuse bladder wall thickening with adjacent inflammatory changes. Stomach/Bowel: --Stomach/Duodenum: No hiatal hernia or other gastric abnormality. Normal duodenal course and caliber. --Small bowel: No dilatation or inflammation. --Colon: Rectosigmoid diverticulosis without acute inflammation. --Appendix: Not visualized. No right lower quadrant inflammation or free fluid. Vascular/Lymphatic: Atherosclerotic calcification is present within the non-aneurysmal abdominal aorta, without hemodynamically significant stenosis. Findings are suspicious for right common femoral DVT and right superficial femoral vein DVT. There is a well-positioned IVC filter in place. --No retroperitoneal lymphadenopathy. --No mesenteric lymphadenopathy. --No pelvic or inguinal lymphadenopathy. Reproductive: Status post hysterectomy. No adnexal mass. Other: No ascites or free air. The abdominal wall is normal. Musculoskeletal. No acute displaced fractures. Review of the MIP images confirms the above  findings. IMPRESSION: 1. No acute pulmonary embolism. 2. Residual nonocclusive thrombus within the main right pulmonary artery. Overall significant interval improvement in PE burden since prior study dated 05/24/2019. 3. Findings suspicious for right common femoral and right superficial femoral vein DVT. Correlation with ultrasound is recommended. 4. Mild diffuse bladder wall thickening with adjacent inflammatory changes. Correlation with urinalysis is recommended to help exclude  underlying cystitis. 5. Cholelithiasis without acute inflammation. 6. Rectosigmoid diverticulosis without evidence of acute diverticulitis. 7. Right middle lobe pulmonary nodule measuring approximately 1.2 cm, stable since prior study dated 05/24/2019. Consider one of the following in 3 months for both low-risk and high-risk individuals: (a) repeat chest CT, (b) follow-up PET-CT, or (c) tissue sampling. This recommendation follows the consensus statement: Guidelines for Management of Incidental Pulmonary Nodules Detected on CT Images: From the Fleischner Society 2017; Radiology 2017; 284:228-243. 8. Well-positioned IVC filter in place. 9. Moderate stool burden. Aortic Atherosclerosis (ICD10-I70.0). Electronically Signed   By: Katherine Mantle M.D.   On: 06/13/2019 15:57   PERIPHERAL VASCULAR CATHETERIZATION  Result Date: 05/27/2019 See op note  US Venous Img Lower Bilateral (DVT)  Result Date: 06/13/2019 CLINICAL DATA:  86 year old female with bilateral leg edema. EXAM: BILATERAL LOWER EXTREMITY VENOUS DOPPLER ULTRASOUND TECHNIQUE: Gray-scale sonography with graded compression, as well as color Doppler and duplex ultrasound were performed to evaluate the lower extremity deep venous systems from the level of the common femoral vein and including the common femoral, femoral, profunda femoral, popliteal and calf veins including the posterior tibial, peroneal and gastrocnemius veins when visible. The superficial great saphenous vein  was also interrogated. Spectral Doppler was utilized to evaluate flow at rest and with distal augmentation maneuvers in the common femoral, femoral and popliteal veins. COMPARISON:  None. FINDINGS: RIGHT LOWER EXTREMITY Common Femoral Vein: Large occlusive thrombus in the common femoral vein. Saphenofemoral Junction: The visualized portion of the saphenous vein at the saphenofemoral junction appears partially patent. There is large thrombus at the saphenofemoral junction. Profunda Femoral Vein: Occlusive thrombus in the profundus femoris. Femoral Vein: Occlusive thrombus in the entire femoral vein with non compressibility. Popliteal Vein: There is occlusive thrombus in the popliteal vein. Calf Veins: There is extensive thrombus involving the calf veins. Superficial Great Saphenous Vein: Not visualized. Venous Reflux:  None. Other Findings:  None. LEFT LOWER EXTREMITY Common Femoral Vein: There is occlusive thrombus in the left common femoral vein with none compression of the vessel. Saphenofemoral Junction: Occlusive thrombus in the femoral vein extending into the saphenous vein. Profunda Femoral Vein: Occlusive thrombus within the profundus femoris vein. Femoral Vein: There is occlusive thrombus with noncompressibility of the vessel. Popliteal Vein: Occlusive thrombus with noncompressibility of the vessels. Calf Veins: The visualized calf veins appear patent. Superficial Great Saphenous Vein: Occlusive thrombus in the visualized great saphenous vein. Venous Reflux:  None. Other Findings:  None. IMPRESSION: Extensive bilateral lower extremity occlusive DVT with the visualized proximal extension to the common femoral vein bilaterally. These results were called by telephone at the time of interpretation on 06/13/2019 at 9:26 pm to Dr. Scotty Court who verbally acknowledged these results. Electronically Signed   By: Elgie Collard M.D.   On: 06/13/2019 21:27    (Echo, Carotid, EGD, Colonoscopy, ERCP)     Subjective: Patient seen and examined on the day of discharge Communicative, but confused Per the daughter this is at or near baseline Medically stable for discharge from the hospital at this time  Discharge Exam: Vitals:   06/14/19 2223 06/15/19 0914  BP: 121/62 113/67  Pulse: 66 74  Resp: 16 15  Temp: 98.3 F (36.8 C)   SpO2: 98% 100%   Vitals:   06/14/19 1500 06/14/19 1707 06/14/19 2223 06/15/19 0914  BP: 110/70 112/67 121/62 113/67  Pulse: 77 64 66 74  Resp: 20 17 16 15   Temp:   98.3 F (36.8 C)   TempSrc:   Oral  SpO2: 97% 95% 98% 100%  Weight:      Height:        General: Pt is alert, awake, not in acute distress Cardiovascular: RRR, S1/S2 +, no rubs, no gallops Respiratory: CTA bilaterally, no wheezing, no rhonchi Abdominal: Soft, NT, ND, bowel sounds + Extremities: no edema, no cyanosis    The results of significant diagnostics from this hospitalization (including imaging, microbiology, ancillary and laboratory) are listed below for reference.     Microbiology: Recent Results (from the past 240 hour(s))  Urine culture     Status: Abnormal   Collection Time: 06/13/19  1:35 PM   Specimen: Urine, Random  Result Value Ref Range Status   Specimen Description   Final    URINE, RANDOM Performed at [facility], [address], [place], [place] [address]    Special Requests   Final    NONE Performed at [facility], [address] [place], [place] [address]    Culture 80,000 COLONIES/mL ESCHERICHIA COLI (A)  Final   Report Status 06/15/2019 FINAL  Final   Organism ID, Bacteria ESCHERICHIA COLI (A)  Final      Susceptibility   Escherichia coli - MIC*    AMPICILLIN >=32 RESISTANT Resistant     CEFAZOLIN <=4 SENSITIVE Sensitive     CEFTRIAXONE <=0.25 SENSITIVE Sensitive     CIPROFLOXACIN >=4 RESISTANT Resistant     GENTAMICIN <=1 SENSITIVE Sensitive     IMIPENEM <=0.25 SENSITIVE Sensitive     NITROFURANTOIN <=16 SENSITIVE  Sensitive     TRIMETH/SULFA >=320 RESISTANT Resistant     AMPICILLIN/SULBACTAM >=32 RESISTANT Resistant     PIP/TAZO 16 SENSITIVE Sensitive     * 80,000 COLONIES/mL ESCHERICHIA COLI  SARS CORONAVIRUS 2 (TAT 6-24 HRS) Nasopharyngeal Nasopharyngeal Swab     Status: None   Collection Time: 06/13/19  6:20 PM   Specimen: Nasopharyngeal Swab  Result Value Ref Range Status   SARS Coronavirus 2 NEGATIVE NEGATIVE Final    Comment: (NOTE) SARS-CoV-2 target nucleic acids are NOT DETECTED. The SARS-CoV-2 RNA is generally detectable in upper and lower respiratory specimens during the acute phase of infection. Negative results do not preclude SARS-CoV-2 infection, do not rule out co-infections with other pathogens, and should not be used as the sole basis for treatment or other patient management decisions. Negative results must be combined with clinical observations, patient history, and epidemiological information. The expected result is Negative. Fact Sheet for Patients: HairSlick.no Fact Sheet for Healthcare Providers: quierodirigir.com This test is not yet approved or cleared by the [place] FDA and  has been authorized for detection and/or diagnosis of SARS-CoV-2 by FDA under an Emergency Use Authorization (EUA). This EUA will remain  in effect (meaning this test can be used) for the duration of the COVID-19 declaration under Section 56 4(b)(1) of the Act, 21 U.S.C. section 360bbb-3(b)(1), unless the authorization is terminated or revoked sooner. Performed at [facility], [address], [place], [place] [address]      Labs: BNP (last 3 results) Recent Labs    05/24/19 2210 06/13/19 1335  BNP 131.0* 151.0*   Basic Metabolic Panel: Recent Labs  Lab 06/13/19 1335 06/13/19 2303 06/14/19 0441 06/15/19 0446  NA 137 138 139 141  K 3.4* 3.4* 3.3* 3.3*  CL 100 104 105 109  CO2 GLUCOSE 119* 137* 92 97   BUN CREATININE 1.39* 1.29* 1.19* 1.18*  CALCIUM 8.6* 7.7*  7.7* 7.3*   Liver Function Tests: Recent Labs  Lab 06/13/19 1335 06/15/19 0446  AST 14* 14*  ALT 9 8  ALKPHOS 51 48  BILITOT 1.7* 1.1  PROT 6.4* 5.7*  ALBUMIN 3.3* 2.8*   No results for input(s): LIPASE, AMYLASE in the last 168 hours. No results for input(s): AMMONIA in the last 168 hours. CBC: Recent Labs  Lab 06/13/19 1335 06/13/19 2303 06/14/19 0441 06/15/19 0446  WBC 6.3 6.1 6.2 7.8  NEUTROABS 4.0  --   --   --   HGB 7.9* 7.4* 7.1* 8.8*  HCT 24.0* 23.2* 22.3* 27.4*  MCV 87.9 89.6 89.2 86.7  PLT 145* 146* 153 173   Cardiac Enzymes: No results for input(s): CKTOTAL, CKMB, CKMBINDEX, TROPONINI in the last 168 hours. BNP: Invalid input(s): POCBNP CBG: Recent Labs  Lab 06/14/19 0912 06/14/19 1215 06/14/19 1726 06/15/19 0905 06/15/19 1139  GLUCAP 80 86 116* 82 90   D-Dimer No results for input(s): DDIMER in the last 72 hours. Hgb A1c Recent Labs    06/13/19 2303  HGBA1C 5.5   Lipid Profile No results for input(s): CHOL, HDL, LDLCALC, TRIG, CHOLHDL, LDLDIRECT in the last 72 hours. Thyroid function studies Recent Labs    06/13/19 1645  TSH 1.198   Anemia work up Recent Labs    06/14/19 0441  FERRITIN 98  TIBC 175*  IRON 23*   Urinalysis    Component Value Date/Time   COLORURINE YELLOW (A) 06/13/2019 1335   APPEARANCEUR CLEAR (A) 06/13/2019 1335   LABSPEC 1.019 06/13/2019 1335   PHURINE 5.0 06/13/2019 1335   GLUCOSEU NEGATIVE 06/13/2019 1335   HGBUR NEGATIVE 06/13/2019 1335   BILIRUBINUR NEGATIVE 06/13/2019 1335   KETONESUR NEGATIVE 06/13/2019 1335   PROTEINUR NEGATIVE 06/13/2019 1335   NITRITE POSITIVE (A) 06/13/2019 1335   LEUKOCYTESUR MODERATE (A) 06/13/2019 1335   Sepsis Labs Invalid input(s): PROCALCITONIN,  WBC,  LACTICIDVEN Microbiology Recent Results (from the past 240 hour(s))  Urine culture     Status: Abnormal   Collection Time: 06/13/19  1:35 PM    Specimen: Urine, Random  Result Value Ref Range Status   Specimen Description   Final    URINE, RANDOM Performed at [facility], [address] [place], [place] [address]    Special Requests   Final    NONE Performed at [facility], [address] [place], [place] [address]    Culture 80,000 COLONIES/mL ESCHERICHIA COLI (A)  Final   Report Status 06/15/2019 FINAL  Final   Organism ID, Bacteria ESCHERICHIA COLI (A)  Final      Susceptibility   Escherichia coli - MIC*    AMPICILLIN >=32 RESISTANT Resistant     CEFAZOLIN <=4 SENSITIVE Sensitive     CEFTRIAXONE <=0.25 SENSITIVE Sensitive     CIPROFLOXACIN >=4 RESISTANT Resistant     GENTAMICIN <=1 SENSITIVE Sensitive     IMIPENEM <=0.25 SENSITIVE Sensitive     NITROFURANTOIN <=16 SENSITIVE Sensitive     TRIMETH/SULFA >=320 RESISTANT Resistant     AMPICILLIN/SULBACTAM >=32 RESISTANT Resistant     PIP/TAZO 16 SENSITIVE Sensitive     * 80,000 COLONIES/mL ESCHERICHIA COLI  SARS CORONAVIRUS 2 (TAT 6-24 HRS) Nasopharyngeal Nasopharyngeal Swab     Status: None   Collection Time: 06/13/19  6:20 PM   Specimen: Nasopharyngeal Swab  Result Value Ref Range Status   SARS Coronavirus 2 NEGATIVE NEGATIVE Final    Comment: (NOTE) SARS-CoV-2 target nucleic acids are NOT DETECTED. The SARS-CoV-2 RNA  is generally detectable in upper and lower respiratory specimens during the acute phase of infection. Negative results do not preclude SARS-CoV-2 infection, do not rule out co-infections with other pathogens, and should not be used as the sole basis for treatment or other patient management decisions. Negative results must be combined with clinical observations, patient history, and epidemiological information. The expected result is Negative. Fact Sheet for Patients: HairSlick.no Fact Sheet for Healthcare Providers: quierodirigir.com This test is not yet approved or  cleared by the [place] FDA and  has been authorized for detection and/or diagnosis of SARS-CoV-2 by FDA under an Emergency Use Authorization (EUA). This EUA will remain  in effect (meaning this test can be used) for the duration of the COVID-19 declaration under Section 56 4(b)(1) of the Act, 21 U.S.C. section 360bbb-3(b)(1), unless the authorization is terminated or revoked sooner. Performed at [facility], [address], [place], [place] [address]      Time coordinating discharge: Over 30 minutes  SIGNED:   Tresa Moore, MD  [facility] 06/15/2019, 3:07 PM Pager   If 7PM-7AM, please contact night-coverage

## 2019-06-15 NOTE — [place] FL2 (Signed)
[place] MEDICAID FL2 LEVEL OF CARE SCREENING TOOL     IDENTIFICATION  Patient Name: Jasmine Hubbard Birthdate: 12-27-1931 Sex: female Admission Date (Current Location): 06/13/2019  [place] and [place] Number:  Chiropodist and Address:  [facility], [address], [place], [place] [address]      Provider Number:    Attending Physician Name and Address:  Tresa Moore, MD  Relative Name and Phone Number:  Toniann Fail [phone]    Current Level of Care: Hospital Recommended Level of Care: Assisted Living Facility Prior Approval Number:    Date Approved/Denied:   PASRR Number:    Discharge Plan: (ALF)    Current Diagnoses: Patient Active Problem List   Diagnosis Date Noted  . Acute encephalopathy 06/14/2019  . AKI (acute kidney injury) (HCC) 06/13/2019  . Anemia 06/13/2019  . History of pulmonary embolus (PE) 06/13/2019  . Bilateral lower extremity edema 06/13/2019  . S/P insertion of IVC (inferior vena caval) filter 06/13/2019  . Hypotension 06/13/2019  . Pulmonary embolism (HCC) 05/24/2019  . Hypoxia 05/24/2019  . Hypokalemia 05/24/2019  . CKD (chronic kidney disease), stage III 05/24/2019  . Dementia (HCC) 04/12/2019  . Acute metabolic encephalopathy 04/12/2019  . Diabetes (HCC) 04/12/2019  . Chronic diastolic CHF (congestive heart failure) (HCC) 04/12/2019  . UTI (urinary tract infection) 04/12/2019    Orientation RESPIRATION BLADDER Height & Weight     Self  Normal(room air) Incontinent Weight: 125 lb 10.6 oz (57 kg) Height:  5\' 4"  (162.6 cm)  BEHAVIORAL SYMPTOMS/MOOD NEUROLOGICAL BOWEL NUTRITION STATUS      Incontinent Diet(heart healthy)  AMBULATORY STATUS COMMUNICATION OF NEEDS Skin   Limited Assist   Normal                       Personal Care Assistance Level of Assistance  Bathing, Feeding, Dressing Bathing Assistance: Limited assistance Feeding assistance: Limited assistance Dressing  Assistance: Limited assistance     Functional Limitations Info  Sight, Hearing, Speech Sight Info: Adequate Hearing Info: Adequate Speech Info: Adequate    SPECIAL CARE FACTORS FREQUENCY  PT (By licensed PT)     PT Frequency: 5x week              Contractures Contractures Info: Not present    Additional Factors Info  Allergies(Naproxen)   Allergies Info: Naproxen           Current Medications (06/15/2019):  This is the current hospital active medication list Current Facility-Administered Medications  Medication Dose Route Frequency Provider Last Rate Last Admin  . acetaminophen (TYLENOL) tablet 650 mg  650 mg Oral Q6H PRN 06/17/2019, MD       Or  . acetaminophen (TYLENOL) suppository 650 mg  650 mg Rectal Q6H PRN Andris Baumann, MD      . alum & mag hydroxide-simeth (MAALOX/MYLANTA) 200-200-20 MG/5ML suspension 30 mL  30 mL Oral Q6H PRN 10-04-2000, MD      . calcium-vitamin D (OSCAL WITH D) 500-200 MG-UNIT per tablet 1 tablet  1 tablet Oral BID Andris Baumann, MD   1 tablet at 06/15/19 1107  . cefdinir (OMNICEF) capsule 300 mg  300 mg Oral Q12H Sreenath, Sudheer B, MD   300 mg at 06/15/19 1109  . citalopram (CELEXA) tablet 10 mg  10 mg Oral Daily 06/17/19, MD   10 mg at 06/15/19 1107  . enoxaparin (LOVENOX) injection 40 mg  40 mg  Subcutaneous Q24H Athena Masse, MD   40 mg at 06/14/19 2224  . feeding supplement (ENSURE ENLIVE) (ENSURE ENLIVE) liquid 237 mL  237 mL Oral BID BM Athena Masse, MD   237 mL at 06/15/19 1108  . furosemide (LASIX) tablet 20 mg  20 mg Oral Daily Athena Masse, MD   20 mg at 06/15/19 1108  . guaifenesin (ROBITUSSIN) 100 MG/5ML syrup 200 mg  200 mg Oral Q6H PRN Athena Masse, MD      . insulin aspart (novoLOG) injection 0-9 Units  0-9 Units Subcutaneous TID WC Judd Gaudier V, MD      . loperamide (IMODIUM) capsule 2 mg  2 mg Oral QID PRN Athena Masse, MD      . magnesium hydroxide (MILK OF MAGNESIA) suspension 30 mL   30 mL Oral QHS PRN Athena Masse, MD      . multivitamin with minerals tablet 1 tablet  1 tablet Oral Daily Athena Masse, MD   1 tablet at 06/15/19 1107  . ondansetron (ZOFRAN) tablet 4 mg  4 mg Oral Q6H PRN Athena Masse, MD       Or  . ondansetron [facility]) injection 4 mg  4 mg Intravenous Q6H PRN Athena Masse, MD      . pravastatin (PRAVACHOL) tablet 40 mg  40 mg Oral q1800 Athena Masse, MD   40 mg at 06/14/19 1716  . sodium chloride (OCEAN) 0.65 % nasal spray 1 spray  1 spray Each Nare TID Athena Masse, MD   1 spray at 06/15/19 1109     Discharge Medications: Please see discharge summary for a list of discharge medications.  Relevant Imaging Results:  Relevant Lab Results:   Additional Information [address], LCSW

## 2019-06-15 NOTE — TOC Transition Note (Signed)
Transition of Care [facility]) - CM/SW Discharge Note   Patient Details  Name: Jasmine Hubbard MRN: [id number] Date of Birth: 1932-01-15  Transition of Care [facility]) CM/SW Contact:  Maud Deed, LCSW Phone Number:[phone] 06/15/2019, 2:46 PM   Clinical Narrative:    Pt is medically stable for discharge. She will be transported back to [facility] by her daughter Toniann Fail. CSW notified Toniann Fail and [address] of discharge.     Final next level of care: Assisted Living Barriers to Discharge: No Barriers Identified   Patient Goals and CMS Choice Patient states their goals for this hospitalization and ongoing recovery are:: to get out of the hospital CMS Medicare.gov Compare Post Acute Care list provided to:: Patient Represenative (must comment) Choice offered to / list presented to : Adult Children  Discharge Placement              Patient chooses bed at: ([facility]) Patient to be transferred to facility by: daughter Name of family member notified: Toniann Fail Patient and family notified of of transfer: 06/15/19  Discharge Plan and Services                                     Social Determinants of Health (SDOH) Interventions     Readmission Risk Interventions Readmission Risk Prevention Plan 04/16/2019 04/15/2019  Transportation Screening Complete Complete  PCP or Specialist Appt within 3-5 Days - Complete  HRI or Home Care Consult Complete -  Palliative Care Screening Not Applicable Not Applicable  Medication Review (RN Care Manager) Complete Complete  Some recent data might be hidden

## 2019-06-15 NOTE — Plan of Care (Signed)

## 2019-07-23 ENCOUNTER — Other Ambulatory Visit (INDEPENDENT_AMBULATORY_CARE_PROVIDER_SITE_OTHER): Payer: Self-pay | Admitting: Vascular Surgery

## 2019-07-23 DIAGNOSIS — Z95828 Presence of other vascular implants and grafts: Secondary | ICD-10-CM

## 2019-07-23 DIAGNOSIS — I82413 Acute embolism and thrombosis of femoral vein, bilateral: Secondary | ICD-10-CM

## 2019-07-31 ENCOUNTER — Ambulatory Visit (INDEPENDENT_AMBULATORY_CARE_PROVIDER_SITE_OTHER): Payer: Medicare PPO | Admitting: Nurse Practitioner

## 2019-07-31 ENCOUNTER — Ambulatory Visit (INDEPENDENT_AMBULATORY_CARE_PROVIDER_SITE_OTHER): Payer: Medicare PPO

## 2019-07-31 ENCOUNTER — Other Ambulatory Visit: Payer: Self-pay

## 2019-07-31 VITALS — BP 123/77 | HR 60 | Ht 63.0 in | Wt 118.0 lb

## 2019-07-31 DIAGNOSIS — I82413 Acute embolism and thrombosis of femoral vein, bilateral: Secondary | ICD-10-CM | POA: Diagnosis not present

## 2019-07-31 DIAGNOSIS — F0150 Vascular dementia without behavioral disturbance: Secondary | ICD-10-CM | POA: Diagnosis not present

## 2019-07-31 DIAGNOSIS — Z95828 Presence of other vascular implants and grafts: Secondary | ICD-10-CM

## 2019-07-31 DIAGNOSIS — L989 Disorder of the skin and subcutaneous tissue, unspecified: Secondary | ICD-10-CM | POA: Insufficient documentation

## 2019-08-01 ENCOUNTER — Encounter (INDEPENDENT_AMBULATORY_CARE_PROVIDER_SITE_OTHER): Payer: Self-pay | Admitting: Nurse Practitioner

## 2019-08-01 NOTE — Progress Notes (Signed)
Subjective:    Patient ID: Jasmine Hubbard, female    DOB: [birth year], 86 y.o.   MRN: [id number] Chief Complaint  Patient presents with  . Follow-up    [facility] post IVC filter placement BIL BVT study    Patient presents today for follow-up regarding IVC filter placed on 05/27/2019.  The patient subsequently presented to the emergency room with a GI bleed and was found to have a pulmonary embolism.  The patient had previously been on Eliquis however it was discontinued due to frequent falls.  The patient does have a history of dementia and slept during the majority of her office visit today.  The patient's daughter is present and is able to provide much of the history.  The GI bleed has subsequently stopped however the patient still has some diarrhea.  She has had no issues at her groin site following IVC filter placement.  She denies any lower extremity pain or excessive lower extremity swelling.  Overall the patient's daughter states that the patient has been her baseline since returning from the hospital.  Today noninvasive studies were done which revealed a chronic thrombus in the right common femoral vein, femoral vein, proximal profunda vein and the right popliteal vein.  The left lower extremity has findings of a chronic DVT in the left common femoral vein, saphenofemoral junction, femoral vein, proximal profunda vein and left popliteal vein.  These results are consistent with the previous study done on 06/13/2019.   Review of Systems  Cardiovascular: Negative for leg swelling.  Psychiatric/Behavioral: Positive for confusion.  All other systems reviewed and are negative.      Objective:   Physical Exam Vitals reviewed. Exam conducted with a chaperone present (Daughter present).  Constitutional:      General: She is sleeping.  Cardiovascular:     Rate and Rhythm: Normal rate and regular rhythm.     Pulses: Normal pulses.     Heart sounds: Normal heart sounds.  Pulmonary:   Effort: Pulmonary effort is normal.     Breath sounds: Normal breath sounds.  Musculoskeletal:     Right lower leg: No edema.     Left lower leg: No edema.  Neurological:     Mental Status: Mental status is at baseline. She is confused.  Psychiatric:        Attention and Perception: She is inattentive.        Cognition and Memory: Cognition is impaired. Memory is impaired.     BP 123/77   Pulse 60   Ht 5\' 3"  (1.6 m)   Wt 118 lb (53.5 kg)   BMI 20.90 kg/m   Past Medical History:  Diagnosis Date  . CHF (congestive heart failure) ([place])   . Diabetes mellitus without complication ([place])   . Left ventricular failure ([place])   . Osteoporosis   . Pulmonary embolism ([place])   . Pulmonary embolus ([place])   . Vascular dementia [facility])     Social History   Socioeconomic History  . Marital status: Widowed    Spouse name: Not on file  . Number of children: Not on file  . Years of education: Not on file  . Highest education level: Not on file  Occupational History  . Not on file  Tobacco Use  . Smoking status: Former Smoker    Types: Cigarettes    Quit date: 08/28/1999    Years since quitting: 19.9  . Smokeless tobacco: Never Used  Substance and Sexual Activity  . Alcohol  use: Not Currently  . Drug use: Never  . Sexual activity: Not on file  Other Topics Concern  . Not on file  Social History Narrative   ** Merged History Encounter **       Social Determinants of Health   Financial Resource Strain:   . Difficulty of Paying Living Expenses:   Food Insecurity:   . Worried About Programme researcher, broadcasting/film/video in the Last Year:   . Barista in the Last Year:   Transportation Needs:   . Freight forwarder (Medical):   Marland Kitchen Lack of Transportation (Non-Medical):   Physical Activity:   . Days of Exercise per Week:   . Minutes of Exercise per Session:   Stress:   . Feeling of Stress :   Social Connections:   . Frequency of Communication with Friends and Family:   . Frequency of  Social Gatherings with Friends and Family:   . Attends Religious Services:   . Active Member of Clubs or Organizations:   . Attends Banker Meetings:   Marland Kitchen Marital Status:   Intimate Partner Violence:   . Fear of Current or Ex-Partner:   . Emotionally Abused:   Marland Kitchen Physically Abused:   . Sexually Abused:     Past Surgical History:  Procedure Laterality Date  . ABDOMINAL HYSTERECTOMY    . BACK SURGERY    . FRACTURE SURGERY     elbow  . IVC FILTER INSERTION N/A 05/27/2019   Procedure: IVC FILTER INSERTION;  Surgeon: Annice Needy, MD;  Location: [facility] INVASIVE CV LAB;  Service: Cardiovascular;  Laterality: N/A;    History reviewed. No pertinent family history.  Allergies  Allergen Reactions  . Naproxen Hives and Shortness Of Breath       Assessment & Plan:   1. S/P insertion of IVC (inferior vena caval) filter I had an extensive discussion with the patient daughter regarding removal of the IVC filter.  Currently the patient has had a previous history of pulmonary embolism as well as blood clots.  The patient is not a good candidate for anticoagulation due to the fact that she is at a high risk for falls, in addition to other issues such as GI bleeds.  The patient also has advanced dementia.  Based on his we will plan on leaving her IVC filter in place at this time.  I discussed with the patient the signs and symptoms of a possible occluded filter versus signs symptoms of a DVT.,  In regards to the patient's DVT she is doing well and does not seem to have any postphlebitic symptoms at this time.  We will have her follow-up in 6 months to evaluate her postphlebitic symptoms. 2. Vascular dementia without behavioral disturbance [facility]) Patient appears to have advanced dementia as she was not participating greatly with the conversation today.  The patient was not able to provide a lot of useful history.   Current Outpatient Medications on File Prior to Visit  Medication Sig Dispense  Refill  . acetaminophen (TYLENOL) 325 MG tablet Take 650 mg by mouth every 6 (six) hours as needed for mild pain.    Marland Kitchen acidophilus (RISAQUAD) CAPS capsule Take 1 capsule by mouth daily.    Marland Kitchen alendronate (FOSAMAX) 70 MG tablet Take 70 mg by mouth every Friday. Take with a full glass of water on an empty stomach.     . Calcium Carb-Cholecalciferol 600-400 MG-UNIT TABS Take 1 tablet by mouth 2 (two) times a  day.     . citalopram (CELEXA) 10 MG tablet Take 10 mg by mouth daily.    . feeding supplement, ENSURE ENLIVE, (ENSURE ENLIVE) LIQD Take 237 mLs by mouth 2 (two) times daily between meals. 237 mL 12  . furosemide (LASIX) 20 MG tablet Take 20 mg by mouth daily.     Marland Kitchen guaifenesin (ROBITUSSIN) 100 MG/5ML syrup Take 200 mg by mouth every 6 (six) hours as needed for cough or congestion. (max 4 doses daily)    . loperamide (IMODIUM A-D) 2 MG tablet Take 1 tablet (2 mg total) by mouth 4 (four) times daily as needed for diarrhea or loose stools. 15 tablet 0  . lovastatin (MEVACOR) 20 MG tablet Take 40 mg by mouth at bedtime.    . magnesium hydroxide (MILK OF MAGNESIA) 400 MG/5ML suspension Take 30 mLs by mouth at bedtime as needed for mild constipation.     . Multiple Vitamin (MULTIVITAMIN WITH MINERALS) TABS tablet Take 1 tablet by mouth daily.    Marland Kitchen neomycin-bacitracin-polymyxin (NEOSPORIN) OINT Apply 1 application topically 4 (four) times daily as needed for wound care.    . Saccharomyces boulardii (PROBIOTIC) 250 MG CAPS Take 1 each by mouth 2 (two) times daily. 60 capsule 0  . sodium chloride (OCEAN) 0.65 % SOLN nasal spray Place 1 spray into both nostrils 3 (three) times daily.     Marland Kitchen alum & mag hydroxide-simeth (MINTOX) 200-200-20 MG/5ML suspension Take 30 mLs by mouth every 6 (six) hours as needed for indigestion or heartburn.     No current facility-administered medications on file prior to visit.    There are no Patient Instructions on file for this visit. No follow-ups on file.   Georgiana Spinner, NP

## 2019-08-06 ENCOUNTER — Encounter: Payer: Self-pay | Admitting: *Deleted

## 2019-08-06 NOTE — Progress Notes (Signed)
Abnormal finding noted. PCP notified and requested referral to the lung nodule clinic for further follow up if appropriate. 

## 2019-08-21 ENCOUNTER — Encounter: Payer: Self-pay | Admitting: *Deleted

## 2019-08-21 NOTE — Progress Notes (Signed)
*  Second attempt* Abnormal finding noted. PCP notified via fax and requested referral to the lung nodule clinic for further follow up if appropriate.

## 2019-09-18 ENCOUNTER — Other Ambulatory Visit: Payer: Self-pay | Admitting: Family Medicine

## 2019-09-18 DIAGNOSIS — R911 Solitary pulmonary nodule: Secondary | ICD-10-CM

## 2019-09-23 ENCOUNTER — Ambulatory Visit: Payer: Medicare PPO

## 2019-10-14 ENCOUNTER — Ambulatory Visit: Payer: Medicare PPO

## 2019-10-31 ENCOUNTER — Ambulatory Visit: Payer: Medicare PPO

## 2019-11-08 ENCOUNTER — Ambulatory Visit: Payer: Medicare PPO

## 2019-12-12 ENCOUNTER — Ambulatory Visit: Payer: Medicare PPO | Admitting: Podiatry

## 2019-12-17 ENCOUNTER — Emergency Department: Payer: Medicare PPO

## 2019-12-17 ENCOUNTER — Other Ambulatory Visit: Payer: Self-pay

## 2019-12-17 ENCOUNTER — Emergency Department
Admission: EM | Admit: 2019-12-17 | Discharge: 2019-12-17 | Disposition: A | Discharge status: Home / Self Care | Payer: Medicare PPO | Attending: Emergency Medicine | Admitting: Emergency Medicine

## 2019-12-17 DIAGNOSIS — Z87891 Personal history of nicotine dependence: Secondary | ICD-10-CM | POA: Insufficient documentation

## 2019-12-17 DIAGNOSIS — I130 Hypertensive heart and chronic kidney disease with heart failure and stage 1 through stage 4 chronic kidney disease, or unspecified chronic kidney disease: Secondary | ICD-10-CM | POA: Diagnosis not present

## 2019-12-17 DIAGNOSIS — E119 Type 2 diabetes mellitus without complications: Secondary | ICD-10-CM | POA: Diagnosis not present

## 2019-12-17 DIAGNOSIS — N183 Chronic kidney disease, stage 3 unspecified: Secondary | ICD-10-CM | POA: Insufficient documentation

## 2019-12-17 DIAGNOSIS — Z79899 Other long term (current) drug therapy: Secondary | ICD-10-CM | POA: Diagnosis not present

## 2019-12-17 DIAGNOSIS — S0083XA Contusion of other part of head, initial encounter: Secondary | ICD-10-CM | POA: Diagnosis not present

## 2019-12-17 DIAGNOSIS — S61511A Laceration without foreign body of right wrist, initial encounter: Secondary | ICD-10-CM | POA: Diagnosis not present

## 2019-12-17 DIAGNOSIS — I509 Heart failure, unspecified: Secondary | ICD-10-CM | POA: Insufficient documentation

## 2019-12-17 DIAGNOSIS — Z7984 Long term (current) use of oral hypoglycemic drugs: Secondary | ICD-10-CM | POA: Insufficient documentation

## 2019-12-17 DIAGNOSIS — S6991XA Unspecified injury of right wrist, hand and finger(s), initial encounter: Secondary | ICD-10-CM | POA: Diagnosis present

## 2019-12-17 DIAGNOSIS — W06XXXA Fall from bed, initial encounter: Secondary | ICD-10-CM | POA: Insufficient documentation

## 2019-12-17 DIAGNOSIS — W19XXXA Unspecified fall, initial encounter: Secondary | ICD-10-CM

## 2019-12-17 MED ORDER — TETANUS-DIPHTH-ACELL PERTUSSIS 5-2.5-18.5 LF-MCG/0.5 IM SUSP: 0.5000 mL | Freq: Once | INTRAMUSCULAR | Status: DC

## 2019-12-17 NOTE — ED Notes (Signed)
Pt discharged home after verbalizing understanding of discharge instructions; nad noted. 

## 2019-12-17 NOTE — ED Triage Notes (Signed)
PT to ED via [facility] from [facility] with c/o fall out of bed. Pt has dementia. Goose egg to forehead. No  Blood thinner on bed sheet.

## 2019-12-17 NOTE — ED Provider Notes (Signed)
[facility] Emergency Department Provider Note  ____________________________________________   None    (approximate)  I have reviewed the triage vital signs and the nursing notes.   HISTORY  Chief Complaint Fall   HPI Jasmine Hubbard is a 86 y.o. female the past medical history of CHF, DM, PE status post right IVC filter, osteoporosis, and CKD as well as dementia who presents via EMS from her nursing facility after she rolled out of bed.  Patient does not recall exactly what happened but thinks she likely rolled out of bed.  She is accompanied by her daughter who is at bedside.  Both patient and daughter deny patient is anticoagulated.  Patient denies any acute pain but does note she hit her head.  She also notes she has a small skin tear over her right wrist and a little discomfort in her right hip.  No other recent falls or injuries.  Patient is otherwise been in her usual state of health without any recent fevers, chills, cough, vomiting, diarrhea, dysuria, chest pain, dental pain, rash, or other acute complaints.         Past Medical History:  Diagnosis Date  . CHF (congestive heart failure) (HCC)   . Diabetes mellitus without complication (HCC)   . Left ventricular failure (HCC)   . Osteoporosis   . Pulmonary embolism (H[facility])   . Pulmonary embolus (H[facility])   . Vascular dementia [facility])     Patient Active Problem List   Diagnosis Date Noted  . Disorder of skin of trunk 07/31/2019  . Acute encephalopathy 06/14/2019  . AKI (acute kidney injury) (HCC) 06/13/2019  . Anemia 06/13/2019  . History of pulmonary embolus (PE) 06/13/2019  . Bilateral lower extremity edema 06/13/2019  . S/P insertion of IVC (inferior vena caval) filter 06/13/2019  . Hypotension 06/13/2019  . Pulmonary embolism (HCC) 05/24/2019  . Hypoxia 05/24/2019  . Hypokalemia 05/24/2019  . CKD (chronic kidney disease), stage III 05/24/2019  . Dementia (HCC) 04/12/2019  . Acute  metabolic encephalopathy 04/12/2019  . Diabetes (HCC) 04/12/2019  . Chronic diastolic CHF (congestive heart failure) (HCC) 04/12/2019  . UTI (urinary tract infection) 04/12/2019  . Brachial neuritis 10/13/2016  . Carpal tunnel syndrome 10/13/2016  . Spinal stenosis of lumbar region 10/13/2016  . Closed fracture of phalanx of finger 10/13/2016  . DDD (degenerative disc disease), cervical 10/13/2016  . Displacement of cervical intervertebral disc 10/13/2016  . Hip pain 10/13/2016  . Shoulder joint pain 10/13/2016  . Risk for falls 01/28/2016  . Vascular dementia (HCC) 12/06/2015  . Influenza A 06/09/2015  . Depression 12/05/2014  . Hyperlipidemia 07/22/2012    Past Surgical History:  Procedure Laterality Date  . ABDOMINAL HYSTERECTOMY    . BACK SURGERY    . FRACTURE SURGERY     elbow  . IVC FILTER INSERTION N/A 05/27/2019   Procedure: IVC FILTER INSERTION;  Surgeon: Annice Needy, MD;  Location: [facility] INVASIVE CV LAB;  Service: Cardiovascular;  Laterality: N/A;    Prior to Admission medications   Medication Sig Start Date End Date Taking? Authorizing Provider  acetaminophen (TYLENOL) 325 MG tablet Take 650 mg by mouth every 6 (six) hours as needed for mild pain.    [provider]  acidophilus (RISAQUAD) CAPS capsule Take 1 capsule by mouth daily.    [provider]  alendronate (FOSAMAX) 70 MG tablet Take 70 mg by mouth every Friday. Take with a full glass of water on an empty stomach.  [provider]  alum & mag hydroxide-simeth (MINTOX) 200-200-20 MG/5ML suspension Take 30 mLs by mouth every 6 (six) hours as needed for indigestion or heartburn.    [provider]  Calcium Carb-Cholecalciferol 600-400 MG-UNIT TABS Take 1 tablet by mouth 2 (two) times a day.     [provider]  citalopram (CELEXA) 10 MG tablet Take 10 mg by mouth daily.    [provider]  feeding supplement, ENSURE ENLIVE, (ENSURE ENLIVE) LIQD Take 237 mLs by  mouth 2 (two) times daily between meals. 05/29/19   Pennie BanterGriffith, Kelly A, DO  furosemide (LASIX) 20 MG tablet Take 20 mg by mouth daily.     [provider]  guaifenesin (ROBITUSSIN) 100 MG/5ML syrup Take 200 mg by mouth every 6 (six) hours as needed for cough or congestion. (max 4 doses daily)    [provider]  loperamide (IMODIUM A-D) 2 MG tablet Take 1 tablet (2 mg total) by mouth 4 (four) times daily as needed for diarrhea or loose stools. 05/29/19   Dhungel, Nishant, MD  lovastatin (MEVACOR) 20 MG tablet Take 40 mg by mouth at bedtime.    [provider]  lovastatin (MEVACOR) 40 MG tablet Take 40 mg by mouth daily. 11/01/19   [provider]  magnesium hydroxide (MILK OF MAGNESIA) 400 MG/5ML suspension Take 30 mLs by mouth at bedtime as needed for mild constipation.     [provider]  metFORMIN (GLUCOPHAGE) 500 MG tablet Take 1 tablet by mouth 2 (two) times daily. 01/19/17   [provider]  Multiple Vitamin (MULTIVITAMIN WITH MINERALS) TABS tablet Take 1 tablet by mouth daily. 05/29/19   Pennie BanterGriffith, Kelly A, DO  neomycin-bacitracin-polymyxin (NEOSPORIN) OINT Apply 1 application topically 4 (four) times daily as needed for wound care.    [provider]  potassium chloride (KLOR-CON) 10 MEQ tablet  11/25/19   [provider]  Saccharomyces boulardii (PROBIOTIC) 250 MG CAPS Take 1 each by mouth 2 (two) times daily. 04/16/19   Thomasenia BottomsAlam, Tawfikul, MD  sodium chloride (OCEAN) 0.65 % SOLN nasal spray Place 1 spray into both nostrils 3 (three) times daily.     [provider]    Allergies Naproxen  History reviewed. No pertinent family history.  Social History Social History   Tobacco Use  . Smoking status: Former Smoker    Types: Cigarettes    Quit date: 08/28/1999    Years since quitting: 20.3  . Smokeless tobacco: Never Used  Vaping Use  . Vaping Use: Never used  Substance Use Topics  . Alcohol use: Not Currently  .  Drug use: Never    Review of Systems  Review of Systems  Constitutional: Negative for chills and fever.  HENT: Negative for sore throat.   Eyes: Negative for pain.  Respiratory: Negative for cough and stridor.   Cardiovascular: Negative for chest pain.  Gastrointestinal: Negative for vomiting.  Musculoskeletal: Positive for falls. Negative for back pain.  Skin: Negative for rash.  Neurological: Negative for seizures, loss of consciousness and headaches.  Psychiatric/Behavioral: Negative for suicidal ideas.  All other systems reviewed and are negative.   ____________________________________________   PHYSICAL EXAM:  VITAL SIGNS: ED Triage Vitals  Enc Vitals Group     BP 12/17/19 0051 105/65     Pulse Rate 12/17/19 0051 76     Resp 12/17/19 0051 18     Temp 12/17/19 0051 97.7 F (36.5 C)     Temp Source 12/17/19 0051 Oral  SpO2 12/17/19 0051 100 %     Weight 12/17/19 0046 120 lb (54.4 kg)     Height 12/17/19 0046 5\' 2"  (1.575 m)     Head Circumference --      Peak Flow --      Pain Score 12/17/19 0046 0     Pain Loc --      Pain Edu? --      Excl. in GC? --    Vitals:   12/17/19 0051  BP: 105/65  Pulse: 76  Resp: 18  Temp: 97.7 F (36.5 C)  SpO2: 100%   Physical Exam Vitals and nursing note reviewed.  Constitutional:      General: She is not in acute distress.    Appearance: She is well-developed.  HENT:     Head: Normocephalic and atraumatic.     Right Ear: External ear normal.     Left Ear: External ear normal.     Nose: Nose normal.  Eyes:     Conjunctiva/sclera: Conjunctivae normal.  Cardiovascular:     Rate and Rhythm: Normal rate and regular rhythm.     Heart sounds: No murmur heard.   Pulmonary:     Effort: Pulmonary effort is normal. No respiratory distress.     Breath sounds: Normal breath sounds.  Abdominal:     Palpations: Abdomen is soft.     Tenderness: There is no abdominal tenderness.  Musculoskeletal:     Cervical back: Neck  supple.  Skin:    General: Skin is warm and dry.  Neurological:     Mental Status: She is alert.  Psychiatric:        Mood and Affect: Mood normal.     Patient has a 2 cm hematoma over left forehead.  She also has a superficial skin tear over the dorsum of her right wrist.  She is confused but at baseline.  Cranial nerves II through XII otherwise grossly intact.  Patient has symmetric grip strength in her upper extremities and is able to move her lower extremities with symmetric strength.  Sensation intact light touch in the distribution of the radial ulnar and median nerves in the bilateral upper extremities.  2+ bilateral radial pulses.  Sensation intact light touch of the lower extremities.  2+ bilateral DP pulses.  There is no tenderness or deformity over the C/T/L-spine.  Patient does have some very mild tenderness over the anterior right hip. ____________________________________________   ____________________________________  RADIOLOGY   Official radiology report(s): DG Wrist Complete Right  Result Date: 12/17/2019 CLINICAL DATA:  Fall with right wrist pain. EXAM: RIGHT WRIST - COMPLETE 3+ VIEW COMPARISON:  01/20/2019 FINDINGS: Generalized osteopenia. No acute fracture or subluxation. Mild first CMC osteoarthritis. IMPRESSION: No acute finding. Electronically Signed   By: 01/22/2019 M.D.   On: 12/17/2019 07:25   CT Head Wo Contrast  Result Date: 12/17/2019 CLINICAL DATA:  Fall, head injury EXAM: CT HEAD WITHOUT CONTRAST TECHNIQUE: Contiguous axial images were obtained from the base of the skull through the vertex without intravenous contrast. COMPARISON:  06/13/2019 FINDINGS: Brain: Normal anatomic configuration. Moderate parenchymal volume loss is commensurate with the patient's age. Moderate periventricular white matter changes are present likely reflecting the sequela of small vessel ischemia. Remote lacunar infarcts are noted within the thalami bilaterally and caudate nuclei  bilaterally. No abnormal intra or extra-axial mass lesion or fluid collection. No abnormal mass effect or midline shift. No evidence of acute intracranial hemorrhage or infarct. Ventricular size is normal. Cerebellum  unremarkable. Vascular: No asymmetric hyperdense vasculature at the skull base. Skull: Intact Sinuses/Orbits: Paranasal sinuses are clear. Orbits are unremarkable. Other: Mastoid air cells and middle ear cavities are clear. Small scalp hematoma noted superficial to the left frontal bone anteriorly. IMPRESSION: Small left frontal scalp hematoma. No acute intracranial abnormality or calvarial fracture. Electronically Signed   By: Helyn Numbers MD   On: 12/17/2019 02:06   CT Cervical Spine Wo Contrast  Result Date: 12/17/2019 CLINICAL DATA:  Fall with neck trauma EXAM: CT CERVICAL SPINE WITHOUT CONTRAST TECHNIQUE: Multidetector CT imaging of the cervical spine was performed without intravenous contrast. Multiplanar CT image reconstructions were also generated. COMPARISON:  04/12/2019 FINDINGS: Alignment: Physiologic Skull base and vertebrae: Remote T1 superior endplate fracture with sclerosis and minimal endplate depression. No acute fracture, erosion, or evident lesion Soft tissues and spinal canal: No prevertebral fluid or swelling. No visible canal hematoma. Disc levels: C6-7 focal disc narrowing. Multilevel degenerative facet spurring. Upper chest: No evidence of injury IMPRESSION: No evidence of acute cervical spine injury. Electronically Signed   By: Marnee Spring M.D.   On: 12/17/2019 06:43   DG Hip Unilat W or Wo Pelvis 2-3 Views Right  Result Date: 12/17/2019 CLINICAL DATA:  Fall. EXAM: DG HIP (WITH OR WITHOUT PELVIS) 2-3V RIGHT COMPARISON:  CT 06/13/2019.  Hip series 01/20/2019. FINDINGS: Diffuse osteopenia. Degenerative changes both hips. No acute bony or joint abnormality. Pelvic calcifications consistent phleboliths. Peripheral vascular calcification. IMPRESSION: 1. Diffuse  osteopenia degenerative change. No acute abnormality. No evidence of fracture dislocation. 2.  Peripheral vascular disease. Electronically Signed   By: Maisie Fus  Register   On: 12/17/2019 07:27    ____________________________________________   PROCEDURES  Procedure(s) performed (including Critical Care):  Procedures   ____________________________________________   INITIAL IMPRESSION / ASSESSMENT AND PLAN / ED COURSE        Patient presents with above-stated history exam for assessment of forehead hematoma and skin tear to the right wrist after a mechanical fall out of bed.  Patient is afebrile hemodynamically stable arrival.  She is confused but at her neurological baseline per her daughter who is at bedside.  She has no focal neurological deficits and is neurovascularly intact in all her extremities.  She does have some very mild tenderness over the anterior right hip but no overlying skin changes or significant strength deficits in the right lower extremity.  Imaging obtained shows no evidence of intracranial hemorrhage, skull fracture and there is no evidence of significant orthopedic injury on CT C-spine, x-ray of the right wrist, and x-ray of the right hip.  Low suspicion for occult orthopedic injury or significant occult visceral injury.  Impression is hematoma to the left forehead and skin tear over the dorsum of the right wrist.  Given stable vital signs with reassuring work-up and low suspicion for occult injury I do believe she is safe for discharge with plan for outpatient follow-up.  Discharged stable condition.  Strict and precautions provided in writing and discussed with daughter.  ____________________________________________   FINAL CLINICAL IMPRESSION(S) / ED DIAGNOSES  Final diagnoses:  Fall, initial encounter  Traumatic hematoma of forehead, initial encounter  Tear of skin of right wrist, initial encounter    Medications  Tdap (BOOSTRIX) injection 0.5 mL (has no  administration in time range)     ED Discharge Orders    None       Note:  This document was prepared using Dragon voice recognition software and may include unintentional dictation errors.  Gilles Chiquito, MD 12/17/19 [id number]

## 2019-12-17 NOTE — ED Notes (Signed)
Pt discharged with daughter to [facility]; will f/u with doctors making housecalls.

## 2019-12-17 NOTE — ED Notes (Signed)
PT's daughter sitting with her

## 2020-01-20 ENCOUNTER — Encounter: Payer: Self-pay | Admitting: Emergency Medicine

## 2020-01-20 ENCOUNTER — Emergency Department: Payer: Medicare PPO

## 2020-01-20 ENCOUNTER — Other Ambulatory Visit: Payer: Self-pay

## 2020-01-20 ENCOUNTER — Emergency Department
Admission: EM | Admit: 2020-01-20 | Discharge: 2020-01-20 | Disposition: A | Discharge status: Home / Self Care | Payer: Medicare PPO | Attending: Emergency Medicine | Admitting: Emergency Medicine

## 2020-01-20 DIAGNOSIS — Z87891 Personal history of nicotine dependence: Secondary | ICD-10-CM | POA: Diagnosis not present

## 2020-01-20 DIAGNOSIS — I5032 Chronic diastolic (congestive) heart failure: Secondary | ICD-10-CM | POA: Insufficient documentation

## 2020-01-20 DIAGNOSIS — E119 Type 2 diabetes mellitus without complications: Secondary | ICD-10-CM | POA: Insufficient documentation

## 2020-01-20 DIAGNOSIS — N183 Chronic kidney disease, stage 3 unspecified: Secondary | ICD-10-CM | POA: Insufficient documentation

## 2020-01-20 DIAGNOSIS — Z7984 Long term (current) use of oral hypoglycemic drugs: Secondary | ICD-10-CM | POA: Insufficient documentation

## 2020-01-20 DIAGNOSIS — F0390 Unspecified dementia without behavioral disturbance: Secondary | ICD-10-CM | POA: Diagnosis not present

## 2020-01-20 DIAGNOSIS — W19XXXA Unspecified fall, initial encounter: Secondary | ICD-10-CM | POA: Insufficient documentation

## 2020-01-20 DIAGNOSIS — N390 Urinary tract infection, site not specified: Secondary | ICD-10-CM | POA: Diagnosis present

## 2020-01-20 LAB — CBC
HCT: 33.3 % — ABNORMAL LOW (ref 36.0–46.0)
Hemoglobin: 10.9 g/dL — ABNORMAL LOW (ref 12.0–15.0)
MCH: 28.8 pg (ref 26.0–34.0)
MCHC: 32.7 g/dL (ref 30.0–36.0)
MCV: 88.1 fL (ref 80.0–100.0)
Platelets: 190 10*3/uL (ref 150–400)
RBC: 3.78 MIL/uL — ABNORMAL LOW (ref 3.87–5.11)
RDW: 13.7 % (ref 11.5–15.5)
WBC: 9.4 10*3/uL (ref 4.0–10.5)
nRBC: 0 % (ref 0.0–0.2)

## 2020-01-20 LAB — URINALYSIS, COMPLETE (UACMP) WITH MICROSCOPIC
Bilirubin Urine: NEGATIVE
Glucose, UA: NEGATIVE mg/dL
Ketones, ur: NEGATIVE mg/dL
Nitrite: NEGATIVE
Protein, ur: 30 mg/dL — AB
Specific Gravity, Urine: 1.01 (ref 1.005–1.030)
WBC, UA: >50 WBC/hpf — ABNORMAL HIGH (ref 0–5)
pH: 8 (ref 5.0–8.0)

## 2020-01-20 LAB — COMPREHENSIVE METABOLIC PANEL
ALT: 12 U/L (ref 0–44)
AST: 18 U/L (ref 15–41)
Albumin: 3.9 g/dL (ref 3.5–5.0)
Alkaline Phosphatase: 60 U/L (ref 38–126)
Anion gap: 11 (ref 5–15)
BUN: 29 mg/dL — ABNORMAL HIGH (ref 8–23)
CO2: 24 mmol/L (ref 22–32)
Calcium: 8.7 mg/dL — ABNORMAL LOW (ref 8.9–10.3)
Chloride: 99 mmol/L (ref 98–111)
Creatinine, Ser: 1.78 mg/dL — ABNORMAL HIGH (ref 0.44–1.00)
GFR, Estimated: 28 mL/min — ABNORMAL LOW (ref >60)
Glucose, Bld: 164 mg/dL — ABNORMAL HIGH (ref 70–99)
Potassium: 4.8 mmol/L (ref 3.5–5.1)
Sodium: 134 mmol/L — ABNORMAL LOW (ref 135–145)
Total Bilirubin: 2 mg/dL — ABNORMAL HIGH (ref 0.3–1.2)
Total Protein: 7.9 g/dL (ref 6.5–8.1)

## 2020-01-20 MED ORDER — FOSFOMYCIN TROMETHAMINE 3 G PO PACK
3.0000 g | PACK | Freq: Once | ORAL | Status: AC
  Administered 2020-01-20: 3 g via ORAL
  Filled 2020-01-20: qty 3

## 2020-01-20 MED ORDER — FOSFOMYCIN TROMETHAMINE 3 G PO PACK: 3.0000 g | PACK | Freq: Once | ORAL | 0 refills | Status: AC

## 2020-01-20 NOTE — ED Provider Notes (Signed)
[facility] Emergency Department Provider Note  Time seen: 12:18 PM  I have reviewed the triage vital signs and the nursing notes.   HISTORY  Chief Complaint Fall   HPI Jasmine Hubbard is a 86 y.o. female with a past medical history of CHF, diabetes, vascular dementia, presents to the emergency department for an unwitnessed fall at her nursing facility.  According to EMS patient was found on the ground in her nursing facility.  They were able to get the patient back into the wheelchair and then EMS transported to the emergency department for evaluation.  Here the patient is awake she is calm cooperative and pleasant.  She denies any pain.  Able to move all extremities.  Patient is unable to contribute to her history or review of systems.   Past Medical History:  Diagnosis Date  . CHF (congestive heart failure) (HCC)   . Diabetes mellitus without complication (HCC)   . Left ventricular failure (HCC)   . Osteoporosis   . Pulmonary embolism (HCC)   . Pulmonary embolus (HCC)   . Vascular dementia [facility])     Patient Active Problem List   Diagnosis Date Noted  . Disorder of skin of trunk 07/31/2019  . Acute encephalopathy 06/14/2019  . AKI (acute kidney injury) (HCC) 06/13/2019  . Anemia 06/13/2019  . History of pulmonary embolus (PE) 06/13/2019  . Bilateral lower extremity edema 06/13/2019  . S/P insertion of IVC (inferior vena caval) filter 06/13/2019  . Hypotension 06/13/2019  . Pulmonary embolism (HCC) 05/24/2019  . Hypoxia 05/24/2019  . Hypokalemia 05/24/2019  . CKD (chronic kidney disease), stage III (HCC) 05/24/2019  . Dementia (HCC) 04/12/2019  . Acute metabolic encephalopathy 04/12/2019  . Diabetes (HCC) 04/12/2019  . Chronic diastolic CHF (congestive heart failure) (HCC) 04/12/2019  . UTI (urinary tract infection) 04/12/2019  . Brachial neuritis 10/13/2016  . Carpal tunnel syndrome 10/13/2016  . Spinal stenosis of lumbar region  10/13/2016  . Closed fracture of phalanx of finger 10/13/2016  . DDD (degenerative disc disease), cervical 10/13/2016  . Displacement of cervical intervertebral disc 10/13/2016  . Hip pain 10/13/2016  . Shoulder joint pain 10/13/2016  . Risk for falls 01/28/2016  . Vascular dementia (HCC) 12/06/2015  . Influenza A 06/09/2015  . Depression 12/05/2014  . Hyperlipidemia 07/22/2012    Past Surgical History:  Procedure Laterality Date  . ABDOMINAL HYSTERECTOMY    . BACK SURGERY    . FRACTURE SURGERY     elbow  . IVC FILTER INSERTION N/A 05/27/2019   Procedure: IVC FILTER INSERTION;  Surgeon: Annice Needy, MD;  Location: [facility] INVASIVE CV LAB;  Service: Cardiovascular;  Laterality: N/A;    Prior to Admission medications   Medication Sig Start Date End Date Taking? Authorizing Provider  acetaminophen (TYLENOL) 325 MG tablet Take 650 mg by mouth every 6 (six) hours as needed for mild pain.    [provider]  acidophilus (RISAQUAD) CAPS capsule Take 1 capsule by mouth daily.    [provider]  alendronate (FOSAMAX) 70 MG tablet Take 70 mg by mouth every Friday. Take with a full glass of water on an empty stomach.     [provider]  alum & mag hydroxide-simeth (MINTOX) 200-200-20 MG/5ML suspension Take 30 mLs by mouth every 6 (six) hours as needed for indigestion or heartburn.    [provider]  Calcium Carb-Cholecalciferol 600-400 MG-UNIT TABS Take 1 tablet by mouth 2 (two) times a day.  [provider]  citalopram (CELEXA) 10 MG tablet Take 10 mg by mouth daily.    [provider]  feeding supplement, ENSURE ENLIVE, (ENSURE ENLIVE) LIQD Take 237 mLs by mouth 2 (two) times daily between meals. 05/29/19   Pennie Banter, DO  furosemide (LASIX) 20 MG tablet Take 20 mg by mouth daily.     [provider]  guaifenesin (ROBITUSSIN) 100 MG/5ML syrup Take 200 mg by mouth every 6 (six) hours as needed for cough or congestion. (max 4  doses daily)    [provider]  loperamide (IMODIUM A-D) 2 MG tablet Take 1 tablet (2 mg total) by mouth 4 (four) times daily as needed for diarrhea or loose stools. 05/29/19   Dhungel, Nishant, MD  lovastatin (MEVACOR) 20 MG tablet Take 40 mg by mouth at bedtime.    [provider]  lovastatin (MEVACOR) 40 MG tablet Take 40 mg by mouth daily. 11/01/19   [provider]  magnesium hydroxide (MILK OF MAGNESIA) 400 MG/5ML suspension Take 30 mLs by mouth at bedtime as needed for mild constipation.     [provider]  metFORMIN (GLUCOPHAGE) 500 MG tablet Take 1 tablet by mouth 2 (two) times daily. 01/19/17   [provider]  Multiple Vitamin (MULTIVITAMIN WITH MINERALS) TABS tablet Take 1 tablet by mouth daily. 05/29/19   Pennie Banter, DO  neomycin-bacitracin-polymyxin (NEOSPORIN) OINT Apply 1 application topically 4 (four) times daily as needed for wound care.    [provider]  potassium chloride (KLOR-CON) 10 MEQ tablet  11/25/19   [provider]  Saccharomyces boulardii (PROBIOTIC) 250 MG CAPS Take 1 each by mouth 2 (two) times daily. 04/16/19   Thomasenia Bottoms, MD  sodium chloride (OCEAN) 0.65 % SOLN nasal spray Place 1 spray into both nostrils 3 (three) times daily.     [provider]    Allergies  Allergen Reactions  . Naproxen Hives and Shortness Of Breath    No family history on file.  Social History Social History   Tobacco Use  . Smoking status: Former Smoker    Types: Cigarettes    Quit date: 08/28/1999    Years since quitting: 20.4  . Smokeless tobacco: Never Used  Vaping Use  . Vaping Use: Never used  Substance Use Topics  . Alcohol use: Not Currently  . Drug use: Never    Review of Systems Unable to obtain a adequate/accurate review of systems secondary to baseline dementia.  ____________________________________________   PHYSICAL EXAM:  VITAL SIGNS: ED Triage Vitals  Enc Vitals Group      BP 01/20/20 1151 102/65     Pulse Rate 01/20/20 1151 75     Resp 01/20/20 1151 18     Temp --      Temp src --      SpO2 01/20/20 1151 95 %     Weight 01/20/20 1150 125 lb (56.7 kg)     Height 01/20/20 1150 5\' 2"  (1.575 m)     Head Circumference --      Peak Flow --      Pain Score 01/20/20 1149 0     Pain Loc --      Pain Edu? --      Excl. in GC? --     Constitutional: Patient is awake alert, no acute distress being lying in bed comfortably. Eyes: Normal exam ENT      Head: Normocephalic and atraumatic.      Mouth/Throat: Mucous  membranes are moist. Cardiovascular: Normal rate, regular rhythm. No murmur Respiratory: Normal respiratory effort without tachypnea nor retractions. Breath sounds are clear  Gastrointestinal: Soft and nontender. No distention.   Musculoskeletal: Nontender with normal range of motion in all extremities.  Neurologic:  Normal speech and language. No gross focal neurologic deficits Skin: Small skin tear to bilateral elbows. Psychiatric: Mood and affect are normal.   ____________________________________________    EKG  EKG viewed and interpreted by myself shows a junctional rhythm at 75 bpm with a slightly widened QRS, normal axis, normal intervals, nonspecific ST changes.  ____________________________________________    RADIOLOGY  X-rays are negative.  CT scans are negative.  ____________________________________________   INITIAL IMPRESSION / ASSESSMENT AND PLAN / ED COURSE  Pertinent labs & imaging results that were available during my care of the patient were reviewed by me and considered in my medical decision making (see chart for details).   Patient presents to the emergency department for an unwitnessed fall at her nursing facility.  Overall the patient appears well.  As precaution we will obtain CT imaging of the head C-spine and x-ray of the pelvis.  Able to range all the patient's extremities with no apparent discomfort besides  possible slight discomfort in her pelvis with left lower extremity range of motion although there is a documented history of hip pain.  We will check basic labs, urinalysis and imaging.  X-rays and CT imaging are negative.  Patient's labs are largely nonrevealing besides a urinary tract infection on urinalysis.  Chronic kidney disease is worsened somewhat compared to baseline.  No white blood cell count elevation.  Vital signs are reassuring.  We will dose fosfomycin in the emergency department and discharged with a one-time prescription for fosfomycin to be taken in 3 days.  Jasmine Hubbard was evaluated in Emergency Department on 01/20/2020 for the symptoms described in the history of present illness. She was evaluated in the context of the global COVID-19 pandemic, which necessitated consideration that the patient might be at risk for infection with the SARS-CoV-2 virus that causes COVID-19. Institutional protocols and algorithms that pertain to the evaluation of patients at risk for COVID-19 are in a state of rapid change based on information released by regulatory bodies including the CDC and federal and state organizations. These policies and algorithms were followed during the patient's care in the ED.  ____________________________________________   FINAL CLINICAL IMPRESSION(S) / ED DIAGNOSES  Fall Urinary tract infection   Minna Antis, MD 01/20/20 1418

## 2020-01-20 NOTE — ED Triage Notes (Signed)
Pt arrived to ED via [facility] from [facility]. Per EMS, pt had unwitnessed fall and were unable to determine it pt current mentation is her baseline.   EMS VS - 94/64, HR 74 NSR, 97% O2, and CBG 183 with hx of diabetes  Pt is oriented to place only, denies any pain at this time.

## 2020-01-20 NOTE — ED Notes (Signed)
Family at bedside at this time. Pt daughter states she is willing and able to take pt back to [facility] at this time.

## 2020-01-20 NOTE — ED Notes (Signed)
This RN and Sam RN visualized skin tears to bilateral elbows upon pt arrival to ED. This RN and Sam RN applied clean brief and gown to pt and changed linens at this time

## 2020-01-20 NOTE — ED Notes (Signed)
Pt to CT now

## 2020-01-20 NOTE — ED Notes (Signed)
Pt daughter verbalized understanding of d/c instructions at this time and has no further questions.   Pt assisted to car with help from this RN and pt daughter.   E-sign not working at this time

## 2020-01-20 NOTE — ED Notes (Signed)
[facility] called and informed of pt d/c from ED and arrival to facility via POV with family.

## 2020-01-31 ENCOUNTER — Other Ambulatory Visit: Payer: Self-pay

## 2020-01-31 ENCOUNTER — Encounter (INDEPENDENT_AMBULATORY_CARE_PROVIDER_SITE_OTHER): Payer: Self-pay | Admitting: Vascular Surgery

## 2020-01-31 ENCOUNTER — Ambulatory Visit (INDEPENDENT_AMBULATORY_CARE_PROVIDER_SITE_OTHER): Payer: Medicare PPO | Admitting: Vascular Surgery

## 2020-01-31 VITALS — BP 133/72 | HR 62 | Resp 15 | Wt 118.0 lb

## 2020-01-31 DIAGNOSIS — Z95828 Presence of other vascular implants and grafts: Secondary | ICD-10-CM

## 2020-01-31 DIAGNOSIS — E119 Type 2 diabetes mellitus without complications: Secondary | ICD-10-CM | POA: Diagnosis not present

## 2020-01-31 DIAGNOSIS — E785 Hyperlipidemia, unspecified: Secondary | ICD-10-CM | POA: Diagnosis not present

## 2020-01-31 DIAGNOSIS — F0150 Vascular dementia without behavioral disturbance: Secondary | ICD-10-CM

## 2020-01-31 DIAGNOSIS — I2602 Saddle embolus of pulmonary artery with acute cor pulmonale: Secondary | ICD-10-CM | POA: Diagnosis not present

## 2020-01-31 NOTE — Assessment & Plan Note (Signed)
Filter is in place.  No current issues.

## 2020-01-31 NOTE — Assessment & Plan Note (Signed)
lipid control important in reducing the progression of atherosclerotic disease. Continue statin therapy  

## 2020-01-31 NOTE — Assessment & Plan Note (Signed)
Fairly advanced 

## 2020-01-31 NOTE — Assessment & Plan Note (Signed)
blood glucose control important in reducing the progression of atherosclerotic disease. Also, involved in wound healing. On appropriate medications.  

## 2020-01-31 NOTE — Progress Notes (Signed)
MRN : [id number]  Jasmine Hubbard is a 86 y.o. (02/26/1932) female who presents with chief complaint of  Chief Complaint  Patient presents with  . Follow-up    64month follow up  .  History of Present Illness: Patient returns today in follow up of her DVT/PE and previous IVC filter placement.  Her daughter provides the history today.  Getting her here and to any appointment has become more more difficult.  Her cognitive decline has worsened.  She really has not had much in the way of leg swelling at all.  She is wearing her stockings diligently.  She has no leg swelling today.  Her daughter says last week there were a couple days were slightly swollen.  Her daughter and I had a discussion about whether or not to remove her IVC filter, but in a debilitated patient high risk for recurrent thromboembolic issues and with a low lifetime risk of filter complications leaving in place certainly seems reasonable.  Current Outpatient Medications  Medication Sig Dispense Refill  . acetaminophen (TYLENOL) 325 MG tablet Take 650 mg by mouth every 6 (six) hours as needed for mild pain.    Marland Kitchen. acidophilus (RISAQUAD) CAPS capsule Take 1 capsule by mouth daily.    Marland Kitchen. alendronate (FOSAMAX) 70 MG tablet Take 70 mg by mouth every Friday. Take with a full glass of water on an empty stomach.     Marland Kitchen. alum & mag hydroxide-simeth (MINTOX) 200-200-20 MG/5ML suspension Take 30 mLs by mouth every 6 (six) hours as needed for indigestion or heartburn.    . Calcium Carb-Cholecalciferol 600-400 MG-UNIT TABS Take 1 tablet by mouth 2 (two) times a day.     . citalopram (CELEXA) 10 MG tablet Take 10 mg by mouth daily.    . feeding supplement, ENSURE ENLIVE, (ENSURE ENLIVE) LIQD Take 237 mLs by mouth 2 (two) times daily between meals. 237 mL 12  . furosemide (LASIX) 20 MG tablet Take 20 mg by mouth daily.     Marland Kitchen. guaifenesin (ROBITUSSIN) 100 MG/5ML syrup Take 200 mg by mouth every 6 (six) hours as needed for cough or  congestion. (max 4 doses daily)    . loperamide (IMODIUM A-D) 2 MG tablet Take 1 tablet (2 mg total) by mouth 4 (four) times daily as needed for diarrhea or loose stools. 15 tablet 0  . lovastatin (MEVACOR) 20 MG tablet Take 40 mg by mouth at bedtime.    . lovastatin (MEVACOR) 40 MG tablet Take 40 mg by mouth daily.    . magnesium hydroxide (MILK OF MAGNESIA) 400 MG/5ML suspension Take 30 mLs by mouth at bedtime as needed for mild constipation.     . metFORMIN (GLUCOPHAGE) 500 MG tablet Take 1 tablet by mouth 2 (two) times daily.    . Multiple Vitamin (MULTIVITAMIN WITH MINERALS) TABS tablet Take 1 tablet by mouth daily.    Marland Kitchen. neomycin-bacitracin-polymyxin (NEOSPORIN) OINT Apply 1 application topically 4 (four) times daily as needed for wound care.    . potassium chloride (KLOR-CON) 10 MEQ tablet     . Saccharomyces boulardii (PROBIOTIC) 250 MG CAPS Take 1 each by mouth 2 (two) times daily. 60 capsule 0  . sodium chloride (OCEAN) 0.65 % SOLN nasal spray Place 1 spray into both nostrils 3 (three) times daily.      No current facility-administered medications for this visit.    Past Medical History:  Diagnosis Date  . CHF (congestive heart failure) (HCC)   . Diabetes mellitus  without complication (HCC)   . Left ventricular failure (HCC)   . Osteoporosis   . Pulmonary embolism (HCC)   . Pulmonary embolus (HCC)   . Vascular dementia [facility])     Past Surgical History:  Procedure Laterality Date  . ABDOMINAL HYSTERECTOMY    . BACK SURGERY    . FRACTURE SURGERY     elbow  . IVC FILTER INSERTION N/A 05/27/2019   Procedure: IVC FILTER INSERTION;  Surgeon: Annice Needy, MD;  Location: [facility] INVASIVE CV LAB;  Service: Cardiovascular;  Laterality: N/A;     Social History   Tobacco Use  . Smoking status: Former Smoker    Types: Cigarettes    Quit date: 08/28/1999    Years since quitting: 20.4  . Smokeless tobacco: Never Used  Vaping Use  . Vaping Use: Never used  Substance Use Topics  .  Alcohol use: Not Currently  . Drug use: Never     Family History  Allergies  Allergen Reactions  . Naproxen Hives and Shortness Of Breath     REVIEW OF SYSTEMS (Negative unless checked)  Constitutional: [] Weight loss  [] Fever  [] Chills Cardiac: [] Chest pain   [] Chest pressure   [] Palpitations   [] Shortness of breath when laying flat   [] Shortness of breath at rest   [] Shortness of breath with exertion. Vascular:  [] Pain in legs with walking   [] Pain in legs at rest   [] Pain in legs when laying flat   [] Claudication   [] Pain in feet when walking  [] Pain in feet at rest  [] Pain in feet when laying flat   [x] History of DVT   [x] Phlebitis   [x] Swelling in legs   [] Varicose veins   [] Non-healing ulcers Pulmonary:   [] Uses home oxygen   [] Productive cough   [] Hemoptysis   [] Wheeze  [] COPD   [] Asthma Neurologic:  [] Dizziness  [] Blackouts   [] Seizures   [] History of stroke   [] History of TIA  [] Aphasia   [] Temporary blindness   [] Dysphagia   [] Weakness or numbness in arms   [] Weakness or numbness in legs X positive for dementia Musculoskeletal:  [x] Arthritis   [] Joint swelling   [x] Joint pain   [] Low back pain Hematologic:  [] Easy bruising  [] Easy bleeding   [] Hypercoagulable state   [] Anemic   Gastrointestinal:  [] Blood in stool   [] Vomiting blood  [x] Gastroesophageal reflux/heartburn   [] Abdominal pain Genitourinary:  [] Chronic kidney disease   [] Difficult urination  [] Frequent urination  [] Burning with urination   [] Hematuria Skin:  [] Rashes   [] Ulcers   [] Wounds Psychological:  [] History of anxiety   []  History of major depression.  Physical Examination  BP 133/72 (BP Location: Left Arm)   Pulse 62   Resp 15   Wt 118 lb (53.5 kg)   BMI 21.58 kg/m  Gen:  WD/WN, NAD Head: [place]/AT, No temporalis wasting. Ear/Nose/Throat: Hearing grossly intact, nares w/o erythema or drainage Eyes: Conjunctiva clear. Sclera non-icteric Neck: Supple.  Trachea midline Pulmonary:  Good air movement, no use  of accessory muscles.  Cardiac: irregular Vascular:  Vessel Right Left  Radial Palpable Palpable               Musculoskeletal: M/S 5/5 throughout.  No deformity or atrophy. No edema. In a wheelchair Neurologic: Sensation grossly intact in extremities.  Symmetrical.  Speech is limited Psychiatric: Judgment and insight are poor. Dermatologic: No rashes or ulcers noted.  No cellulitis or open wounds.       Labs Recent Results (from the  past 2160 hour(s))  CBC     Status: Abnormal   Collection Time: 01/20/20 12:06 PM  Result Value Ref Range   WBC 9.4 4.0 - 10.5 K/uL   RBC 3.78 (L) 3.87 - 5.11 MIL/uL   Hemoglobin 10.9 (L) 12.0 - 15.0 g/dL   HCT 78.2 (L) 36 - 46 %   MCV 88.1 80.0 - 100.0 fL   MCH 28.8 26.0 - 34.0 pg   MCHC 32.7 30.0 - 36.0 g/dL   RDW 95.6 21.3 - 08.6 %   Platelets 190 150 - 400 K/uL   nRBC 0.0 0.0 - 0.2 %    Comment: Performed at [facility], [address], [place], [place] [address]  Comprehensive metabolic panel     Status: Abnormal   Collection Time: 01/20/20 12:06 PM  Result Value Ref Range   Sodium 134 (L) 135 - 145 mmol/L   Potassium 4.8 3.5 - 5.1 mmol/L   Chloride 99 98 - 111 mmol/L   CO2 24 22 - 32 mmol/L   Glucose, Bld 164 (H) 70 - 99 mg/dL    Comment: Glucose reference range applies only to samples taken after fasting for at least 8 hours.   BUN 29 (H) 8 - 23 mg/dL   Creatinine, Ser 9.62 (H) 0.44 - 1.00 mg/dL   Calcium 8.7 (L) 8.9 - 10.3 mg/dL   Total Protein 7.9 6.5 - 8.1 g/dL   Albumin 3.9 3.5 - 5.0 g/dL   AST 18 15 - 41 U/L   ALT 12 0 - 44 U/L   Alkaline Phosphatase 60 38 - 126 U/L   Total Bilirubin 2.0 (H) 0.3 - 1.2 mg/dL   GFR, Estimated 28 (L) >60 mL/min    Comment: (NOTE) Calculated using the CKD-EPI Creatinine Equation (2021)    Anion gap 11 5 - 15    Comment: Performed at [facility], [address] [place], [place] [address]  Urinalysis, Complete w Microscopic     Status: Abnormal   Collection Time:  01/20/20 12:06 PM  Result Value Ref Range   Color, Urine YELLOW (A) YELLOW   APPearance CLOUDY (A) CLEAR   Specific Gravity, Urine 1.010 1.005 - 1.030   pH 8.0 5.0 - 8.0   Glucose, UA NEGATIVE NEGATIVE mg/dL   Hgb urine dipstick SMALL (A) NEGATIVE   Bilirubin Urine NEGATIVE NEGATIVE   Ketones, ur NEGATIVE NEGATIVE mg/dL   Protein, ur 30 (A) NEGATIVE mg/dL   Nitrite NEGATIVE NEGATIVE   Leukocytes,Ua LARGE (A) NEGATIVE   RBC / HPF 0-5 0 - 5 RBC/hpf   WBC, UA >50 (H) 0 - 5 WBC/hpf   Bacteria, UA FEW (A) NONE SEEN   Squamous Epithelial / LPF 0-5 0 - 5   WBC Clumps PRESENT    Amorphous Crystal PRESENT     Comment: Performed at [facility], [address], [place], [place] [address]    Radiology DG Pelvis 1-2 Views  Result Date: 01/20/2020 CLINICAL DATA:  Pain after fall. EXAM: PELVIS - 1-2 VIEW COMPARISON:  None. FINDINGS: There is no evidence of pelvic fracture or diastasis. No pelvic bone lesions are seen. IMPRESSION: Negative. Electronically Signed   By: Lupita Raider M.D.   On: 01/20/2020 13:07   CT Head Wo Contrast  Result Date: 01/20/2020 CLINICAL DATA:  Head trauma, minor. Additional history provided: Unwitnessed fall. EXAM: CT HEAD WITHOUT CONTRAST CT CERVICAL SPINE WITHOUT CONTRAST TECHNIQUE: Multidetector CT imaging of the head and cervical spine was performed following  the standard protocol without intravenous contrast. Multiplanar CT image reconstructions of the cervical spine were also generated. COMPARISON:  Prior head CT 12/17/2019. CT cervical spine 12/17/2019. FINDINGS: CT HEAD FINDINGS Brain: Moderate to moderately advanced generalized cerebral atrophy. Moderate ill-defined hypoattenuation within cerebral white matter is nonspecific, but compatible with chronic small vessel ischemic disease. Redemonstrated chronic lacunar infarcts within the deep gray nuclei. There is no acute intracranial hemorrhage. No demarcated cortical infarct. No extra-axial fluid  collection. No evidence of intracranial mass. No midline shift. Vascular: No hyperdense vessel.  Atherosclerotic calcifications. Skull: Normal. Negative for fracture or focal lesion. Sinuses/Orbits: Visualized orbits show no acute finding. Mild ethmoid sinus mucosal thickening. Small osteoma within the posterior right ethmoid air cell. No significant mastoid effusion. CT CERVICAL SPINE FINDINGS Alignment: Straightening of the expected cervical lordosis. Cervical levocurvature. No significant spondylolisthesis. Skull base and vertebrae: The basion-dental and atlanto-dental intervals are maintained.No evidence of acute fracture to the cervical spine. Redemonstrated mild chronic T1 superior endplate compression fracture. Soft tissues and spinal canal: No prevertebral fluid or swelling. No visible canal hematoma. Disc levels: Cervical spondylosis. Most notably at C6-C7, there is advanced disc space narrowing with degenerative endplate irregularity, a posterior disc osteophyte complex and uncovertebral hypertrophy. Bilateral C6-C7 bony neural foraminal narrowing. No high-grade spinal canal stenosis. Upper chest: No consolidation within the imaged lung apices. No visible pneumothorax. IMPRESSION: CT head: 1. No evidence of acute intracranial abnormality. 2. Stable moderate chronic small vessel ischemic disease with multiple chronic lacunar infarcts. 3. Stable moderate to moderately advanced generalized cerebral atrophy. 4. Mild ethmoid sinus mucosal thickening. CT cervical spine: 1. No evidence of acute fracture to the cervical spine. 2. Cervical spondylosis as described and greatest at C6-C7. 3. Cervical levocurvature, which may be positional. 4. Redemonstrated mild chronic T1 superior endplate compression deformity. Electronically Signed   By: Jackey Loge DO   On: 01/20/2020 12:55   CT Cervical Spine Wo Contrast  Result Date: 01/20/2020 CLINICAL DATA:  Head trauma, minor. Additional history provided: Unwitnessed  fall. EXAM: CT HEAD WITHOUT CONTRAST CT CERVICAL SPINE WITHOUT CONTRAST TECHNIQUE: Multidetector CT imaging of the head and cervical spine was performed following the standard protocol without intravenous contrast. Multiplanar CT image reconstructions of the cervical spine were also generated. COMPARISON:  Prior head CT 12/17/2019. CT cervical spine 12/17/2019. FINDINGS: CT HEAD FINDINGS Brain: Moderate to moderately advanced generalized cerebral atrophy. Moderate ill-defined hypoattenuation within cerebral white matter is nonspecific, but compatible with chronic small vessel ischemic disease. Redemonstrated chronic lacunar infarcts within the deep gray nuclei. There is no acute intracranial hemorrhage. No demarcated cortical infarct. No extra-axial fluid collection. No evidence of intracranial mass. No midline shift. Vascular: No hyperdense vessel.  Atherosclerotic calcifications. Skull: Normal. Negative for fracture or focal lesion. Sinuses/Orbits: Visualized orbits show no acute finding. Mild ethmoid sinus mucosal thickening. Small osteoma within the posterior right ethmoid air cell. No significant mastoid effusion. CT CERVICAL SPINE FINDINGS Alignment: Straightening of the expected cervical lordosis. Cervical levocurvature. No significant spondylolisthesis. Skull base and vertebrae: The basion-dental and atlanto-dental intervals are maintained.No evidence of acute fracture to the cervical spine. Redemonstrated mild chronic T1 superior endplate compression fracture. Soft tissues and spinal canal: No prevertebral fluid or swelling. No visible canal hematoma. Disc levels: Cervical spondylosis. Most notably at C6-C7, there is advanced disc space narrowing with degenerative endplate irregularity, a posterior disc osteophyte complex and uncovertebral hypertrophy. Bilateral C6-C7 bony neural foraminal narrowing. No high-grade spinal canal stenosis. Upper chest: No consolidation within the imaged lung apices.  No visible  pneumothorax. IMPRESSION: CT head: 1. No evidence of acute intracranial abnormality. 2. Stable moderate chronic small vessel ischemic disease with multiple chronic lacunar infarcts. 3. Stable moderate to moderately advanced generalized cerebral atrophy. 4. Mild ethmoid sinus mucosal thickening. CT cervical spine: 1. No evidence of acute fracture to the cervical spine. 2. Cervical spondylosis as described and greatest at C6-C7. 3. Cervical levocurvature, which may be positional. 4. Redemonstrated mild chronic T1 superior endplate compression deformity. Electronically Signed   By: Jackey Loge DO   On: 01/20/2020 12:55    Assessment/Plan  Hyperlipidemia lipid control important in reducing the progression of atherosclerotic disease. Continue statin therapy   Diabetes (HCC) blood glucose control important in reducing the progression of atherosclerotic disease. Also, involved in wound healing. On appropriate medications.   Vascular dementia (HCC) Fairly advanced  S/P insertion of IVC (inferior vena caval) filter  Her daughter and I had a discussion about whether or not to remove her IVC filter, but in a debilitated patient high risk for recurrent thromboembolic issues and with a low lifetime risk of filter complications leaving in place certainly seems reasonable.  I do not think taking it out is worth the small lifetime risks for the filter in her case.  Her daughter agrees.  We will leave this in.  She will return as needed.  Pulmonary embolism (HCC) Filter is in place.  No current issues.    Festus Barren, MD  01/31/2020 2:26 PM    This note was created with Dragon medical transcription system.  Any errors from dictation are purely unintentional

## 2020-01-31 NOTE — Assessment & Plan Note (Signed)
Her daughter and I had a discussion about whether or not to remove her IVC filter, but in a debilitated patient high risk for recurrent thromboembolic issues and with a low lifetime risk of filter complications leaving in place certainly seems reasonable.  I do not think taking it out is worth the small lifetime risks for the filter in her case.  Her daughter agrees.  We will leave this in.  She will return as needed.

## 2021-02-26 IMAGING — CT CT HEAD W/O CM
3 series · 16 of 47 positions shown, 19 images · non-contrast
Comparison: 01/20/2019

CLINICAL DATA: Headaches following recent fall, initial encounter

EXAM:
CT HEAD WITHOUT CONTRAST
TECHNIQUE: Contiguous axial images were obtained from the base of the skull
through the vertex without intravenous contrast.

[Series 3: head wo · axial · 0.40mm/px · z∈[+413,+543]mm · 10 of 32 slices shown, 13 images]
[im 3/32  brain]
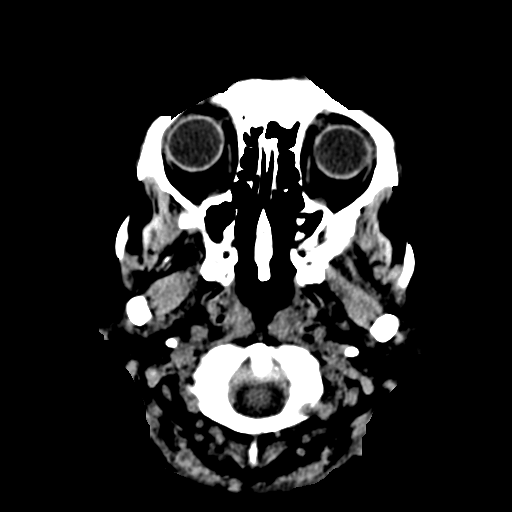
[im 3/32  bone]
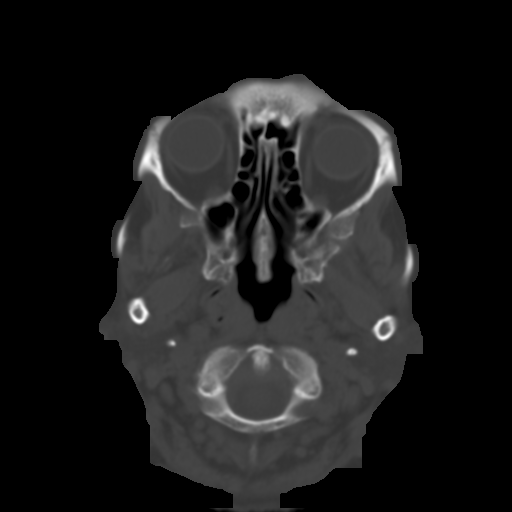
[im 6/32  brain]
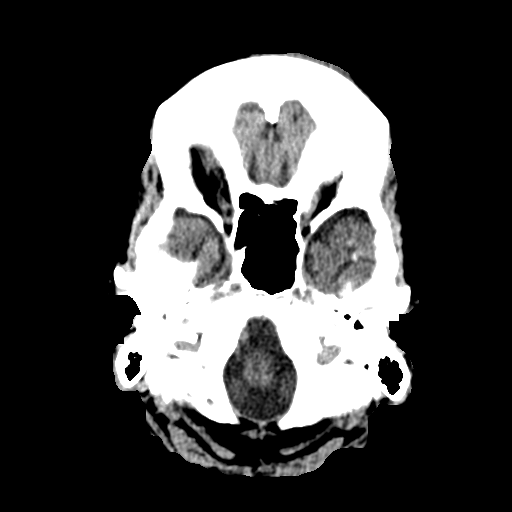
[im 9/32  brain]
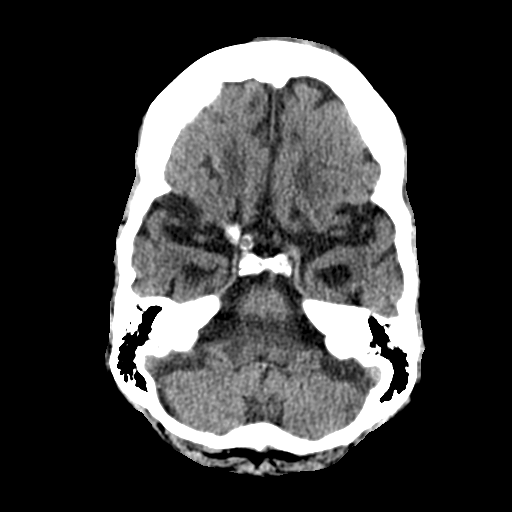
[im 11/32  brain]
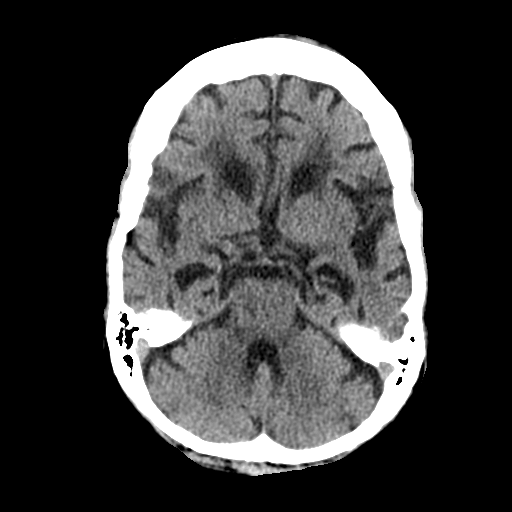
[im 14/32  brain]
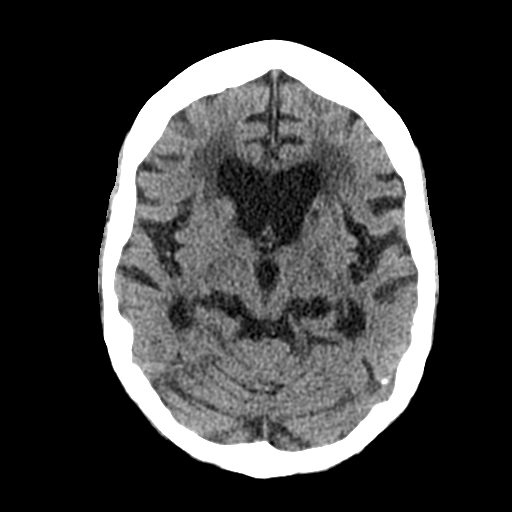
[im 14/32  bone]
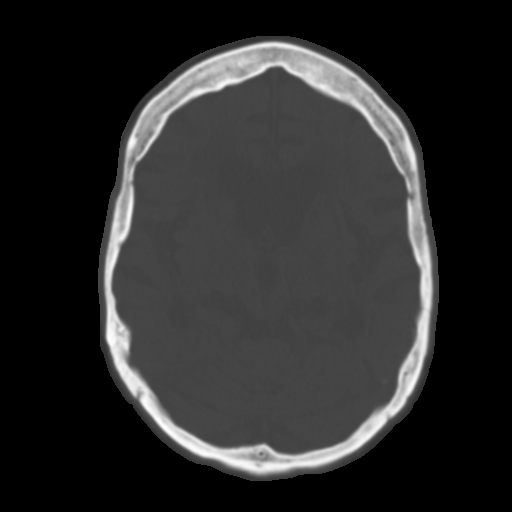
[im 18/32  brain]
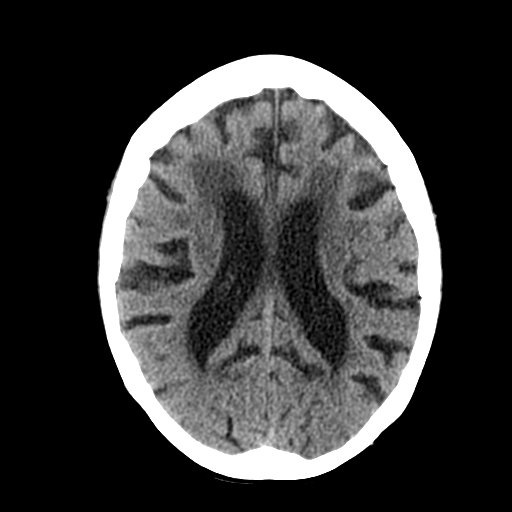
[im 21/32  brain]
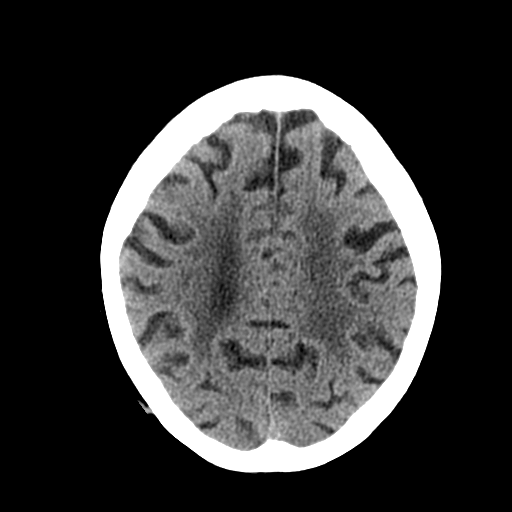
[im 24/32  brain]
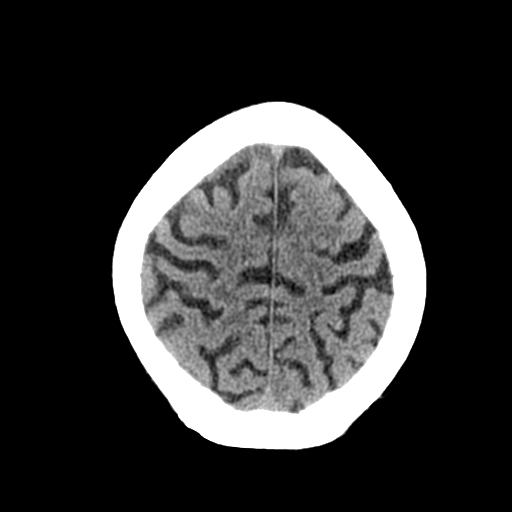
[im 26/32  brain]
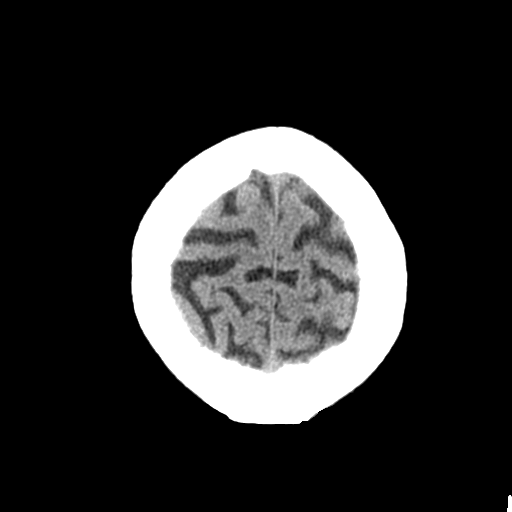
[im 26/32  bone]
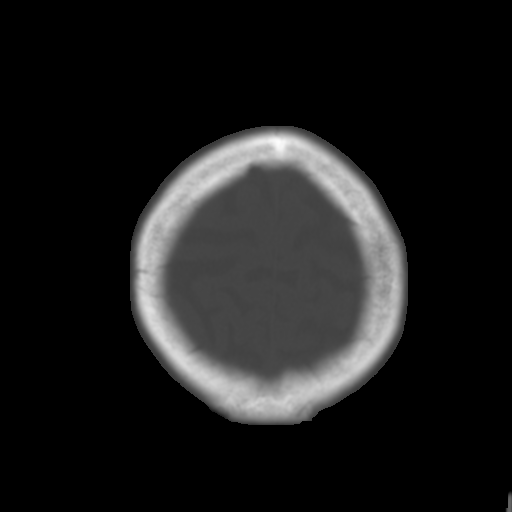
[im 29/32  brain]
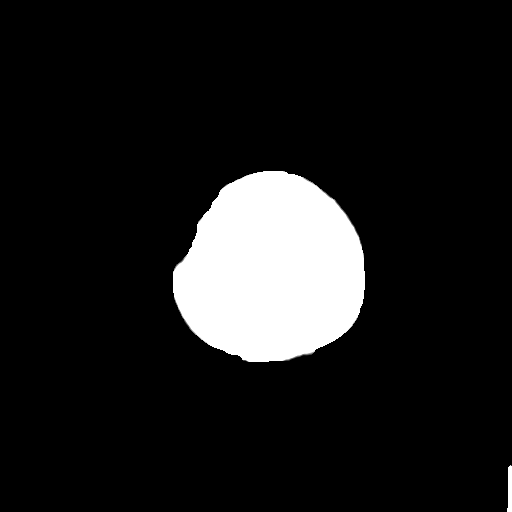

[Series 4: coronal soft tissue · coronal · 0.29mm/px · 3 of 64 slices shown]
[im 22/64  brain]
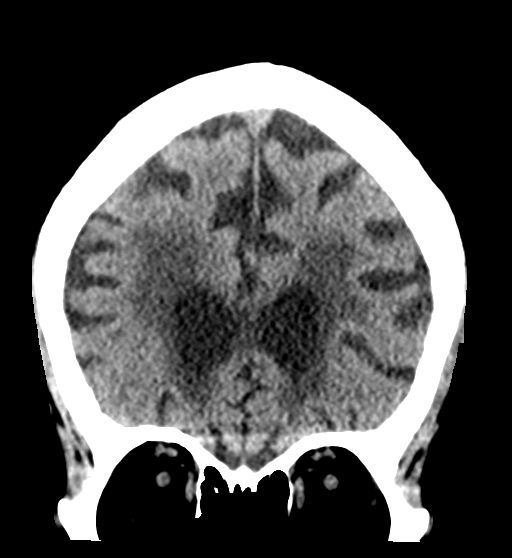
[im 29/64  brain]
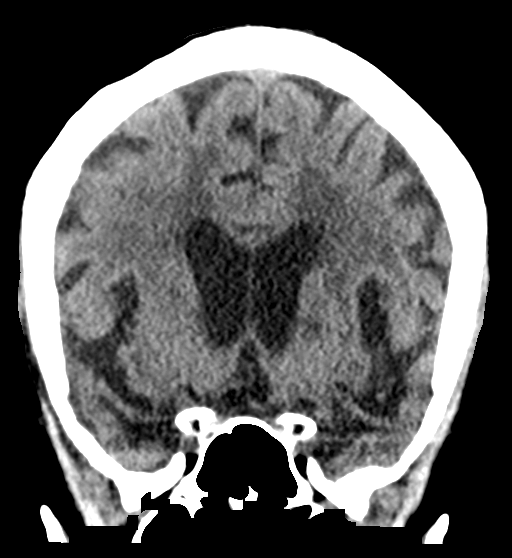
[im 36/64  brain]
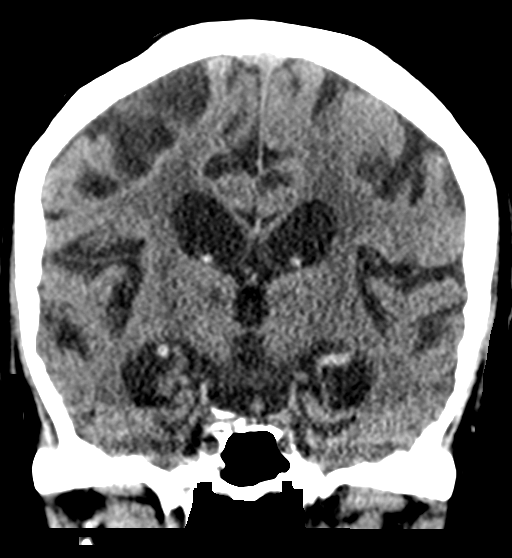

[Series 5: sagittal soft tissue · sagittal · 0.31mm/px · 3 of 49 slices shown]
[im 17/49  brain]
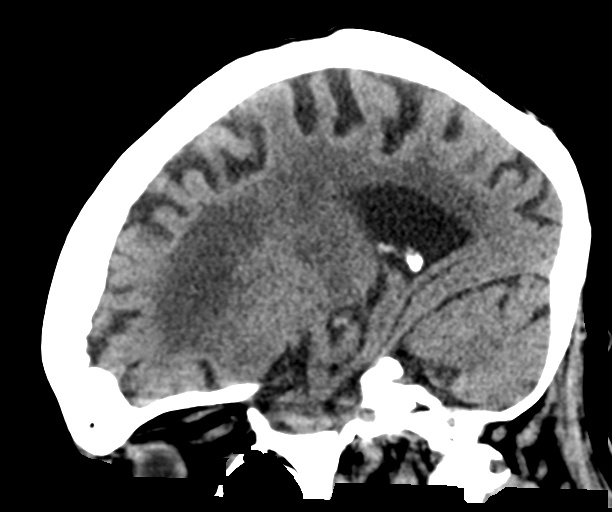
[im 25/49  brain]
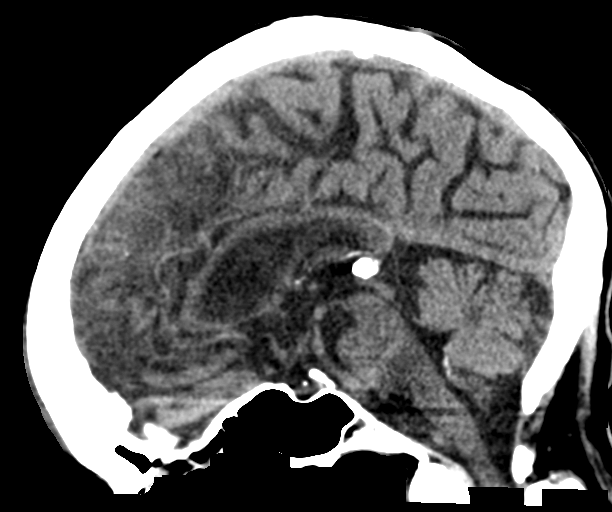
[im 33/49  brain]
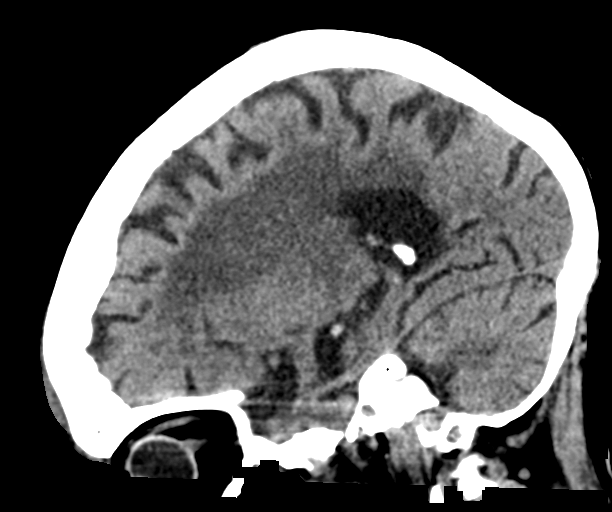

[16 of 47 positions shown; findings below may reference images not displayed]

FINDINGS: Brain: Chronic atrophic and ischemic changes are identified. No
findings to suggest acute hemorrhage, acute infarction or
space-occupying mass lesion are noted. Scattered thalamic lacunar
infarcts are seen.

Vascular: No hyperdense vessel or unexpected calcification.

Skull: Normal. Negative for fracture or focal lesion.

Sinuses/Orbits: No acute finding.

Other: None.
IMPRESSION: Chronic atrophic and ischemic changes without acute abnormality.

## 2021-12-26 DEATH — deceased

## 2022-01-24 ENCOUNTER — Encounter (INDEPENDENT_AMBULATORY_CARE_PROVIDER_SITE_OTHER): Payer: Self-pay
